# Patient Record
Sex: Female | Born: 1970 | Race: White | Hispanic: No | Marital: Single | Smoking: Current every day smoker
Health system: Southern US, Community
[De-identification: ages and names within clinical notes are randomized; demographics above are authoritative.]

## PROBLEM LIST (undated history)

## (undated) DIAGNOSIS — D509 Iron deficiency anemia, unspecified: Secondary | ICD-10-CM

## (undated) DIAGNOSIS — R200 Anesthesia of skin: Secondary | ICD-10-CM

## (undated) DIAGNOSIS — E7800 Pure hypercholesterolemia, unspecified: Secondary | ICD-10-CM

## (undated) DIAGNOSIS — F32A Depression, unspecified: Secondary | ICD-10-CM

## (undated) DIAGNOSIS — Z915 Personal history of self-harm: Secondary | ICD-10-CM

## (undated) DIAGNOSIS — F329 Major depressive disorder, single episode, unspecified: Secondary | ICD-10-CM

## (undated) DIAGNOSIS — M1990 Unspecified osteoarthritis, unspecified site: Secondary | ICD-10-CM

## (undated) DIAGNOSIS — N179 Acute kidney failure, unspecified: Secondary | ICD-10-CM

## (undated) DIAGNOSIS — R202 Paresthesia of skin: Secondary | ICD-10-CM

## (undated) DIAGNOSIS — K219 Gastro-esophageal reflux disease without esophagitis: Secondary | ICD-10-CM

## (undated) DIAGNOSIS — F1910 Other psychoactive substance abuse, uncomplicated: Secondary | ICD-10-CM

## (undated) DIAGNOSIS — J45909 Unspecified asthma, uncomplicated: Secondary | ICD-10-CM

## (undated) DIAGNOSIS — Z9151 Personal history of suicidal behavior: Secondary | ICD-10-CM

## (undated) DIAGNOSIS — F419 Anxiety disorder, unspecified: Secondary | ICD-10-CM

## (undated) HISTORY — PX: MOUTH SURGERY: SHX715

## (undated) HISTORY — PX: TONSILLECTOMY: SUR1361

## (undated) HISTORY — DX: Anesthesia of skin: R20.0

## (undated) HISTORY — PX: REFRACTIVE SURGERY: SHX103

## (undated) HISTORY — DX: Paresthesia of skin: R20.2

---

## 1997-04-24 HISTORY — PX: OTHER SURGICAL HISTORY: SHX169

## 1997-09-25 ENCOUNTER — Emergency Department (HOSPITAL_COMMUNITY): Admission: EM | Admit: 1997-09-25 | Discharge: 1997-09-25 | Payer: Self-pay | Admitting: Emergency Medicine

## 1997-09-28 ENCOUNTER — Encounter: Admission: RE | Admit: 1997-09-28 | Discharge: 1997-12-27 | Payer: Self-pay | Admitting: Internal Medicine

## 1997-11-20 ENCOUNTER — Ambulatory Visit (HOSPITAL_BASED_OUTPATIENT_CLINIC_OR_DEPARTMENT_OTHER): Admission: RE | Admit: 1997-11-20 | Discharge: 1997-11-20 | Payer: Self-pay | Admitting: *Deleted

## 1998-09-23 ENCOUNTER — Ambulatory Visit: Admission: RE | Admit: 1998-09-23 | Discharge: 1998-09-23 | Payer: Self-pay | Admitting: Occupational Medicine

## 1998-09-23 ENCOUNTER — Encounter: Payer: Self-pay | Admitting: Occupational Medicine

## 2000-03-15 ENCOUNTER — Emergency Department (HOSPITAL_COMMUNITY): Admission: EM | Admit: 2000-03-15 | Discharge: 2000-03-16 | Payer: Self-pay | Admitting: Emergency Medicine

## 2000-08-14 ENCOUNTER — Emergency Department (HOSPITAL_COMMUNITY): Admission: EM | Admit: 2000-08-14 | Discharge: 2000-08-14 | Payer: Self-pay

## 2000-08-19 ENCOUNTER — Encounter: Payer: Self-pay | Admitting: Otolaryngology

## 2000-08-19 ENCOUNTER — Ambulatory Visit (HOSPITAL_COMMUNITY): Admission: RE | Admit: 2000-08-19 | Discharge: 2000-08-19 | Payer: Self-pay | Admitting: Otolaryngology

## 2000-09-25 ENCOUNTER — Other Ambulatory Visit: Admission: RE | Admit: 2000-09-25 | Discharge: 2000-09-25 | Payer: Self-pay | Admitting: Obstetrics & Gynecology

## 2000-09-26 ENCOUNTER — Emergency Department (HOSPITAL_COMMUNITY): Admission: EM | Admit: 2000-09-26 | Discharge: 2000-09-26 | Payer: Self-pay | Admitting: *Deleted

## 2001-04-17 ENCOUNTER — Encounter: Payer: Self-pay | Admitting: Emergency Medicine

## 2001-04-17 ENCOUNTER — Emergency Department (HOSPITAL_COMMUNITY): Admission: EM | Admit: 2001-04-17 | Discharge: 2001-04-17 | Payer: Self-pay | Admitting: Emergency Medicine

## 2001-04-24 ENCOUNTER — Encounter: Payer: Self-pay | Admitting: Emergency Medicine

## 2001-04-24 ENCOUNTER — Observation Stay (HOSPITAL_COMMUNITY): Admission: EM | Admit: 2001-04-24 | Discharge: 2001-04-25 | Payer: Self-pay | Admitting: Emergency Medicine

## 2001-05-02 ENCOUNTER — Encounter (INDEPENDENT_AMBULATORY_CARE_PROVIDER_SITE_OTHER): Payer: Self-pay | Admitting: Specialist

## 2001-05-02 ENCOUNTER — Observation Stay (HOSPITAL_COMMUNITY): Admission: RE | Admit: 2001-05-02 | Discharge: 2001-05-03 | Payer: Self-pay | Admitting: Surgery

## 2001-12-19 ENCOUNTER — Encounter: Payer: Self-pay | Admitting: Emergency Medicine

## 2001-12-19 ENCOUNTER — Ambulatory Visit (HOSPITAL_COMMUNITY): Admission: RE | Admit: 2001-12-19 | Discharge: 2001-12-19 | Payer: Self-pay | Admitting: Emergency Medicine

## 2002-04-22 ENCOUNTER — Emergency Department (HOSPITAL_COMMUNITY): Admission: EM | Admit: 2002-04-22 | Discharge: 2002-04-22 | Payer: Self-pay | Admitting: *Deleted

## 2002-04-24 HISTORY — PX: APPENDECTOMY: SHX54

## 2002-05-09 ENCOUNTER — Other Ambulatory Visit: Admission: RE | Admit: 2002-05-09 | Discharge: 2002-05-09 | Payer: Self-pay | Admitting: Internal Medicine

## 2003-08-04 ENCOUNTER — Other Ambulatory Visit: Admission: RE | Admit: 2003-08-04 | Discharge: 2003-08-04 | Payer: Self-pay | Admitting: Internal Medicine

## 2006-03-14 ENCOUNTER — Ambulatory Visit: Payer: Self-pay | Admitting: Family Medicine

## 2006-03-14 ENCOUNTER — Inpatient Hospital Stay (HOSPITAL_COMMUNITY): Admission: EM | Admit: 2006-03-14 | Discharge: 2006-03-20 | Payer: Self-pay | Admitting: Emergency Medicine

## 2006-03-20 ENCOUNTER — Ambulatory Visit: Payer: Self-pay | Admitting: Psychiatry

## 2006-03-20 ENCOUNTER — Inpatient Hospital Stay (HOSPITAL_COMMUNITY): Admission: RE | Admit: 2006-03-20 | Discharge: 2006-03-23 | Payer: Self-pay | Admitting: Psychiatry

## 2007-01-22 ENCOUNTER — Other Ambulatory Visit: Admission: RE | Admit: 2007-01-22 | Discharge: 2007-01-22 | Payer: Self-pay | Admitting: Internal Medicine

## 2007-06-29 ENCOUNTER — Encounter: Admission: RE | Admit: 2007-06-29 | Discharge: 2007-06-29 | Payer: Self-pay | Admitting: Orthopedic Surgery

## 2007-07-13 ENCOUNTER — Emergency Department (HOSPITAL_COMMUNITY): Admission: EM | Admit: 2007-07-13 | Discharge: 2007-07-13 | Payer: Self-pay | Admitting: Emergency Medicine

## 2007-11-07 ENCOUNTER — Emergency Department (HOSPITAL_COMMUNITY): Admission: EM | Admit: 2007-11-07 | Discharge: 2007-11-07 | Payer: Self-pay | Admitting: Emergency Medicine

## 2008-02-10 ENCOUNTER — Emergency Department (HOSPITAL_COMMUNITY): Admission: EM | Admit: 2008-02-10 | Discharge: 2008-02-10 | Payer: Self-pay | Admitting: Emergency Medicine

## 2008-03-24 ENCOUNTER — Ambulatory Visit: Payer: Self-pay | Admitting: Family Medicine

## 2008-03-24 ENCOUNTER — Telehealth: Payer: Self-pay | Admitting: Family Medicine

## 2008-03-24 ENCOUNTER — Encounter: Payer: Self-pay | Admitting: Family Medicine

## 2008-03-24 ENCOUNTER — Other Ambulatory Visit: Admission: RE | Admit: 2008-03-24 | Discharge: 2008-03-24 | Payer: Self-pay | Admitting: Family Medicine

## 2008-03-24 DIAGNOSIS — R519 Headache, unspecified: Secondary | ICD-10-CM | POA: Insufficient documentation

## 2008-03-24 DIAGNOSIS — F411 Generalized anxiety disorder: Secondary | ICD-10-CM | POA: Insufficient documentation

## 2008-03-24 DIAGNOSIS — M25519 Pain in unspecified shoulder: Secondary | ICD-10-CM | POA: Insufficient documentation

## 2008-03-24 DIAGNOSIS — R51 Headache: Secondary | ICD-10-CM | POA: Insufficient documentation

## 2008-03-24 DIAGNOSIS — F418 Other specified anxiety disorders: Secondary | ICD-10-CM | POA: Insufficient documentation

## 2008-04-21 ENCOUNTER — Ambulatory Visit: Payer: Self-pay | Admitting: Family Medicine

## 2008-05-28 ENCOUNTER — Ambulatory Visit: Payer: Self-pay | Admitting: Family Medicine

## 2008-05-28 DIAGNOSIS — M549 Dorsalgia, unspecified: Secondary | ICD-10-CM | POA: Insufficient documentation

## 2008-05-28 DIAGNOSIS — R1012 Left upper quadrant pain: Secondary | ICD-10-CM | POA: Insufficient documentation

## 2008-06-04 ENCOUNTER — Ambulatory Visit: Payer: Self-pay | Admitting: Family Medicine

## 2008-06-04 ENCOUNTER — Emergency Department (HOSPITAL_COMMUNITY): Admission: EM | Admit: 2008-06-04 | Discharge: 2008-06-04 | Payer: Self-pay | Admitting: Emergency Medicine

## 2008-06-12 ENCOUNTER — Telehealth: Payer: Self-pay | Admitting: Family Medicine

## 2008-09-30 ENCOUNTER — Telehealth (INDEPENDENT_AMBULATORY_CARE_PROVIDER_SITE_OTHER): Payer: Self-pay | Admitting: *Deleted

## 2008-09-30 ENCOUNTER — Ambulatory Visit: Payer: Self-pay | Admitting: Family Medicine

## 2008-09-30 DIAGNOSIS — J45909 Unspecified asthma, uncomplicated: Secondary | ICD-10-CM | POA: Insufficient documentation

## 2008-10-02 ENCOUNTER — Ambulatory Visit: Payer: Self-pay | Admitting: Family Medicine

## 2008-11-09 ENCOUNTER — Inpatient Hospital Stay (HOSPITAL_COMMUNITY): Admission: AD | Admit: 2008-11-09 | Discharge: 2008-11-13 | Payer: Self-pay | Admitting: Obstetrics and Gynecology

## 2008-11-09 ENCOUNTER — Ambulatory Visit: Payer: Self-pay | Admitting: Obstetrics & Gynecology

## 2008-11-09 ENCOUNTER — Encounter: Payer: Self-pay | Admitting: Obstetrics & Gynecology

## 2008-11-12 ENCOUNTER — Telehealth: Payer: Self-pay | Admitting: Family Medicine

## 2008-11-24 ENCOUNTER — Ambulatory Visit: Payer: Self-pay | Admitting: Family Medicine

## 2008-11-24 DIAGNOSIS — IMO0002 Reserved for concepts with insufficient information to code with codable children: Secondary | ICD-10-CM | POA: Insufficient documentation

## 2008-11-24 LAB — CONVERTED CEMR LAB
AST: 18 units/L (ref 0–37)
Albumin: 3.4 g/dL — ABNORMAL LOW (ref 3.5–5.2)
Alkaline Phosphatase: 120 units/L — ABNORMAL HIGH (ref 39–117)
Basophils Absolute: 0 10*3/uL (ref 0.0–0.1)
Calcium: 8.8 mg/dL (ref 8.4–10.5)
GFR calc non Af Amer: 74.52 mL/min (ref >60)
Glucose, Bld: 83 mg/dL (ref 70–99)
Hemoglobin: 10.1 g/dL — ABNORMAL LOW (ref 12.0–15.0)
Lymphocytes Relative: 28 % (ref 12.0–46.0)
Monocytes Relative: 2.2 % — ABNORMAL LOW (ref 3.0–12.0)
Neutro Abs: 5.8 10*3/uL (ref 1.4–7.7)
RDW: 14.1 % (ref 11.5–14.6)
Sodium: 141 meq/L (ref 135–145)
Total Bilirubin: 0.5 mg/dL (ref 0.3–1.2)

## 2008-11-25 ENCOUNTER — Telehealth: Payer: Self-pay | Admitting: Family Medicine

## 2008-11-26 ENCOUNTER — Ambulatory Visit: Payer: Self-pay | Admitting: Family Medicine

## 2008-11-30 ENCOUNTER — Ambulatory Visit: Payer: Self-pay | Admitting: Family Medicine

## 2008-12-02 ENCOUNTER — Telehealth: Payer: Self-pay | Admitting: Family Medicine

## 2008-12-07 ENCOUNTER — Telehealth: Payer: Self-pay | Admitting: Internal Medicine

## 2009-01-14 ENCOUNTER — Emergency Department (HOSPITAL_COMMUNITY): Admission: EM | Admit: 2009-01-14 | Discharge: 2009-01-14 | Payer: Self-pay | Admitting: Family Medicine

## 2009-01-25 ENCOUNTER — Telehealth: Payer: Self-pay | Admitting: Family Medicine

## 2009-01-27 ENCOUNTER — Ambulatory Visit: Payer: Self-pay | Admitting: Family Medicine

## 2009-04-16 ENCOUNTER — Ambulatory Visit: Payer: Self-pay | Admitting: Diagnostic Radiology

## 2009-04-16 ENCOUNTER — Emergency Department (HOSPITAL_BASED_OUTPATIENT_CLINIC_OR_DEPARTMENT_OTHER): Admission: EM | Admit: 2009-04-16 | Discharge: 2009-04-16 | Payer: Self-pay | Admitting: Emergency Medicine

## 2009-05-07 ENCOUNTER — Ambulatory Visit: Payer: Self-pay | Admitting: Family Medicine

## 2009-05-07 ENCOUNTER — Telehealth: Payer: Self-pay | Admitting: Family Medicine

## 2009-05-16 ENCOUNTER — Emergency Department (HOSPITAL_COMMUNITY): Admission: EM | Admit: 2009-05-16 | Discharge: 2009-05-16 | Payer: Self-pay | Admitting: Emergency Medicine

## 2009-05-18 ENCOUNTER — Telehealth: Payer: Self-pay | Admitting: Family Medicine

## 2009-05-18 DIAGNOSIS — Z9189 Other specified personal risk factors, not elsewhere classified: Secondary | ICD-10-CM | POA: Insufficient documentation

## 2009-05-19 ENCOUNTER — Ambulatory Visit: Payer: Self-pay | Admitting: Gastroenterology

## 2009-05-19 ENCOUNTER — Telehealth: Payer: Self-pay | Admitting: Family Medicine

## 2009-05-19 ENCOUNTER — Telehealth: Payer: Self-pay | Admitting: Internal Medicine

## 2009-05-19 DIAGNOSIS — R110 Nausea: Secondary | ICD-10-CM | POA: Insufficient documentation

## 2009-05-19 DIAGNOSIS — R1031 Right lower quadrant pain: Secondary | ICD-10-CM | POA: Insufficient documentation

## 2009-05-20 ENCOUNTER — Telehealth: Payer: Self-pay | Admitting: Physician Assistant

## 2009-05-20 ENCOUNTER — Ambulatory Visit (HOSPITAL_COMMUNITY): Admission: RE | Admit: 2009-05-20 | Discharge: 2009-05-20 | Payer: Self-pay | Admitting: Gastroenterology

## 2009-05-20 ENCOUNTER — Ambulatory Visit: Payer: Self-pay | Admitting: Family Medicine

## 2009-05-20 ENCOUNTER — Other Ambulatory Visit: Admission: RE | Admit: 2009-05-20 | Discharge: 2009-05-20 | Payer: Self-pay | Admitting: Family Medicine

## 2009-05-20 DIAGNOSIS — Z720 Tobacco use: Secondary | ICD-10-CM | POA: Insufficient documentation

## 2009-05-21 ENCOUNTER — Telehealth: Payer: Self-pay | Admitting: Physician Assistant

## 2009-05-21 LAB — CONVERTED CEMR LAB
Albumin: 4.2 g/dL (ref 3.5–5.2)
Basophils Absolute: 0.1 10*3/uL (ref 0.0–0.1)
CRP, High Sensitivity: 1.9 (ref 0.00–5.00)
Eosinophils Absolute: 0.2 10*3/uL (ref 0.0–0.7)
HCT: 41.4 % (ref 36.0–46.0)
Hemoglobin: 13.7 g/dL (ref 12.0–15.0)
Lymphs Abs: 3.6 10*3/uL (ref 0.7–4.0)
MCHC: 33.1 g/dL (ref 30.0–36.0)
Monocytes Absolute: 0.9 10*3/uL (ref 0.1–1.0)
Neutro Abs: 6 10*3/uL (ref 1.4–7.7)
RDW: 12.5 % (ref 11.5–14.6)

## 2009-05-27 ENCOUNTER — Telehealth: Payer: Self-pay | Admitting: Family Medicine

## 2009-05-28 ENCOUNTER — Telehealth: Payer: Self-pay | Admitting: Family Medicine

## 2009-06-10 ENCOUNTER — Ambulatory Visit: Payer: Self-pay | Admitting: Family Medicine

## 2009-06-10 DIAGNOSIS — M542 Cervicalgia: Secondary | ICD-10-CM | POA: Insufficient documentation

## 2009-06-14 ENCOUNTER — Telehealth: Payer: Self-pay | Admitting: Family Medicine

## 2009-06-24 ENCOUNTER — Telehealth: Payer: Self-pay | Admitting: Family Medicine

## 2009-06-25 ENCOUNTER — Telehealth: Payer: Self-pay | Admitting: *Deleted

## 2009-06-28 ENCOUNTER — Telehealth: Payer: Self-pay | Admitting: Family Medicine

## 2010-04-23 ENCOUNTER — Emergency Department (HOSPITAL_COMMUNITY)
Admission: EM | Admit: 2010-04-23 | Discharge: 2010-04-23 | Payer: Self-pay | Source: Home / Self Care | Admitting: Emergency Medicine

## 2010-05-09 ENCOUNTER — Emergency Department (HOSPITAL_COMMUNITY)
Admission: EM | Admit: 2010-05-09 | Discharge: 2010-05-10 | Payer: Self-pay | Source: Home / Self Care | Admitting: Emergency Medicine

## 2010-05-24 NOTE — Assessment & Plan Note (Signed)
Summary: Abd. pain/seen in ER on1/23/2011   History of Present Illness Visit Type: Initial Visit Primary GI MD: Melvia Heaps MD [facility] Primary Provider: JEFFRE TODD, MD Chief Complaint: abdominal pain History of Present Illness:   40 YO FEMALE NEW TO GI TODAY,REFERRED BY DR TODD FOR EVALUATION OF LOWER ABDOMINAL PAIN. PT RELATES ONE WEEK HX OF INTERMITTENT SHARP RIGHT SIDED LOWER ABDOMINAL PAIN RADIATING UP INTO HER RIGHT MID ABDOMEN,AND INTO HER BACK. SHE DESCRIBES THE PAIN AS LIKE A BAD MENSTRUAL CRAMP. SHE REPORTS LOW GRADE FEVER IN THE 99 RANGE,NO DIARRHEA, MELENA. +NAUSEA, NO VOMITING,APPETITE POOR,BUT NO DEFINITE INCREASED PAIN POST PRANDIALLY. DENIES ASA/NSAID USE. SHE HAS HAD AN APPENDECTOMY,HAD A C-SECTION 6 MONTHS AGO. NO URINARY SXS. SHE DOES HAVE TEENAGE STEP-CHILDREN WHO SHE LIVES WITH WHO WERE ILL LAST WEEK WITH A GI ILLNESS. SHE WENT TO THE ER WITH THIS SAME PAIN ON 05/16/09;HAD A PELVIC US WHICH WAS NEGATIVE ;LABS UNREMARKABLE EXCEPT WBC12.0,NO SHIFT.   GI Review of Systems    Reports abdominal pain, acid reflux, belching, bloating, loss of appetite, nausea, and  weight loss.     Location of  Abdominal pain: lower abdomen. Weight loss of 6 pounds over 1 week.   Denies chest pain, dysphagia with liquids, dysphagia with solids, heartburn, vomiting blood, and  weight gain.        Denies anal fissure, black tarry stools, change in bowel habit, constipation, diarrhea, diverticulosis, fecal incontinence, heme positive stool, hemorrhoids, irritable bowel syndrome, jaundice, light color stool, liver problems, rectal bleeding, and  rectal pain.    Current Medications (verified): 1)  Multi-Day  Tabs (Multiple Vitamin) .... Once Daily 2)  Klonopin 1 Mg Tabs (Clonazepam) .Marland Kitchen.. 1 Tab @ Bedtime 3)  Promethazine Hcl 25 Mg Supp (Promethazine Hcl) .Marland Kitchen.. 1 Q 8 Hr. As Needed 4)  Cymbalta 30 Mg Cpep (Duloxetine Hcl) .... Once Daily 5)  Magnesium 500 Mg Tabs (Magnesium) .... Once Daily 6)  Vicodin  5-500 Mg Tabs (Hydrocodone-Acetaminophen) .... As Needed 7)  Ibuprofen 800 Mg Tabs (Ibuprofen) .... As Needed For Pain  Allergies (verified): 1)  ! Sulfa  Past History:  Past Medical History: Headache narcotic addiction Anxiety Depression Anemia Arthritis Chronic Headaches GERD Hypertension Obesity Pneumonia Urinary Tract Infection  Past Surgical History: Appendectomy Tonsillectomy C-section Tympanoplasty  Family History: Reviewed history from 03/24/2008 and no changes required. Family History of Alcoholism/Addiction Family History of Anxiety Family History Depression Family History High cholesterol Family History Hypertension  Social History: Single Current Smoker Alcohol use-no Drug use-no Regular exercise-no Occupation:RN  Review of Systems       The patient complains of allergy/sinus, anxiety-new, arthritis/joint pain, back pain, depression-new, fatigue, fever, headaches-new, hearing problems, heart murmur, night sweats, sleeping problems, thirst - excessive, and urine leakage.         OTHERWISE NEGATIVE EXCEPT AS IN HPI  Vital Signs:  Patient profile:   40 year old female Height:      62.25 inches Weight:      185.13 pounds BMI:     33.71 Temp:     99 degrees F oral Pulse rate:   100 / minute Pulse rhythm:   regular BP sitting:   100 / 60  (left arm) Cuff size:   regular  Vitals Entered By: June McMurray CMA Duncan Dull) (May 19, 2009 1:47 PM)  Physical Exam  General:  Well developed, well nourished, no acute distress. Head:  Normocephalic and atraumatic. Eyes:  PERRLA, no icterus. Lungs:  Clear throughout to auscultation. Heart:  Regular rate and rhythm; no murmurs, rubs,  or bruits. Abdomen:  SOFT,TENDER RLQ,NO GUARDING,NO REBOUND,NO PALP MASS OR HSM,BS+ Rectal:  NOT DONE Extremities:  No clubbing, cyanosis, edema or deformities noted. Neurologic:  Alert and  oriented x4;  grossly normal neurologically. Psych:  Alert and cooperative.  Normal mood and affect.   Impression & Recommendations:  Problem # 1:  ABDOMINAL PAIN RIGHT LOWER QUADRANT (ICD-789.03) Assessment New 40 YO FEMALE WITH ONE WEEK HX OF INTENSE RIGHT LOWER ABDOMINAL PAIN,NAUSEA. ETIOLOGY UNCLEAR;PT S/P REMOTE APPENDECTOMY,S/P C-SECTION 6 MONTHS AGO.   SCHEDULE FOR CT ABDOMEN/PELVIS  STOP MOTRIN WHICH IS NOT HELPING PAIN REFILL VICODEN 5/500 ONE by mouth Q 6 HOURS AS NEEDED FOR  PAIN #30 TRIAL OF BENTYL 10 MG 3 TIMES DAILY AS NEEDED FOR PAIN/CRAMPING REPEAT CBC,CHECK HEPATIC PANEL FOLLOW UP IN OFFICE IN 2-3 WEEKS DR KAPLAN;MAY NEED FURTHER WORK-UP IN THE INTERIM PENDING CT FINDINGS. Orders: CT Abdomen/Pelvis with Contrast (CT Abd/Pelvis w/con) TLB-CBC Platelet - w/Differential (85025-CBCD) TLB-Hepatic/Liver Function Pnl (80076-HEPATIC) TLB-CRP-High Sensitivity (C-Reactive Protein) (86140-FCRP)  Problem # 2:  TOBACCO ABUSE (ICD-305.1) Assessment: Comment Only  Problem # 3:  ANXIETY (ICD-300.00) Assessment: Comment Only  Patient Instructions: 1)  Please go to the lab, basement level. 2)  We scheduled the CT at [facility], 1st floor. 3)  Date: 05-20-09 at 3:30 PM.  Arrive at 3:15PM.  4)  We sent contrast home with you.  Put it in your refrigerator when you get home.  Do not pour over ice to drink it. Directions provided. 5)  We have a pREscription for you to take to your pharmacy for pain, Vicodin.  6)  We sent a pREscription electronically to your pharmacy for Bentyl. 7)  We made you a follow up appointment with Dr. Arlyce Dice on 06-07-09 at 10AM. 8)  The medication list was reviewed and reconciled.  All changed / newly prescribed medications were explained.  A complete medication list was provided to the patient / caregiver. Prescriptions: BENTYL 10 MG CAPS (DICYCLOMINE HCL) Take 1 tab 3 times a day before meals for cramping and pain.  #90 x 0   Entered by:   Lowry Ram NCMA   Authorized by:   Sammuel Cooper PA-c   Signed by:    Lowry Ram NCMA on 05/19/2009   Method used:   Electronically to        [facility] [address] (retail)       [phone]       [place], [place]  [address]       Ph: [phone]       Fax: [phone]   RxID:   [id number] VICODIN 5-500 MG TABS (HYDROCODONE-ACETAMINOPHEN) Take 1 tab every 6 hours as needed for pain.  #30 x 0   Entered by:   Lowry Ram NCMA   Authorized by:   Sammuel Cooper PA-c   Signed by:   Lowry Ram NCMA on 05/19/2009   Method used:   Printed then faxed to ...       [facility] [address] (retail)       [phone]       [place], [place]  [address]       Ph: [phone]       Fax: [phone]   RxID:   [id number]

## 2010-05-24 NOTE — Progress Notes (Signed)
Summary: klonopin refill  Phone Note From Pharmacy   Summary of Call: patient would like a refill of klonopin is this okay to fill? Initial call taken by: Kern Reap CMA Duncan Dull),  May 27, 2009 1:00 PM  Follow-up for Phone Call        okay her k lonopin, until she sees a psychiatrist, 0 .5 mg, dispense 30 tablets, one nightly no refills.  All further refills are to be done by the psychiatrist in the future Follow-up by: Roderick Pee MD,  May 28, 2009 9:19 AM    New/Updated Medications: KLONOPIN 0.5 MG TABS (CLONAZEPAM) take one tab at bedtime Prescriptions: KLONOPIN 0.5 MG TABS (CLONAZEPAM) take one tab at bedtime  #30 x 0   Entered by:   Kern Reap CMA (AAMA)   Authorized by:   Roderick Pee MD   Signed by:   Kern Reap CMA (AAMA) on 05/28/2009   Method used:   Telephoned to ...       [facility] [address] (retail)       [phone]       [place], [place]  [address]       Ph: [phone]       Fax: [phone]   RxID:   [id number]

## 2010-05-24 NOTE — Progress Notes (Signed)
Summary: Pt went to ER for abdominal pain. Needs to discuss w/ nurse  Phone Note Call from Patient Call back at [phone] cell   Caller: Patient Summary of Call: Pt had to go to ER on Sunday for abdominal pain. Pt is sch for pap this Friday 05/21/09 with Dr. Tawanna Cooler. Pt needs to speak with Fleet Contras before she comes in for the appt.  Initial call taken by: Lucy Antigua,  May 18, 2009 10:56 AM  Follow-up for Phone Call        please call Follow-up by: Roderick Pee MD,  May 18, 2009 1:44 PM  Additional Follow-up for Phone Call Additional follow up Details #1::        patient is still in pain.  I offered a earlier appointment. vicodin motrin with little relief.  any other suggestions? pt does not have any insurance.  would you still like for her to go to GI? Additional Follow-up by: Kern Reap CMA Duncan Dull),  May 18, 2009 3:36 PM  New Problems: ABDOMINAL PAIN, HX OF (ICD-V15.89)   Additional Follow-up for Phone Call Additional follow up Details #2::    Appt scheduled for today.  Pt aware. Follow-up by: Corky Mull,  May 19, 2009 10:45 AM  New Problems: ABDOMINAL PAIN, HX OF (ICD-V15.89)

## 2010-05-24 NOTE — Progress Notes (Signed)
Summary: would like to switch meds  Phone Note Call from Patient Call back at Home Phone [phone]   Caller: Patient---live call Summary of Call: Her Cymbalta is to  expensive. Would like to switch Prozac. Her pharmacy is [facility] [address]. Initial call taken by: Warnell Forester,  June 14, 2009 9:35 AM  Follow-up for Phone Call        ok............Marland KitchenProzac 40 mg, dispense 100 tablets directions one nightly, refills x 2 Follow-up by: Roderick Pee MD,  June 14, 2009 9:48 AM    New/Updated Medications: FLUOXETINE HCL 40 MG CAPS (FLUOXETINE HCL) take one tab two times a day Prescriptions: FLUOXETINE HCL 40 MG CAPS (FLUOXETINE HCL) take one tab two times a day  #180 x 2   Entered by:   Kern Reap CMA (AAMA)   Authorized by:   Roderick Pee MD   Signed by:   Kern Reap CMA (AAMA) on 06/14/2009   Method used:   Electronically to        [facility] [address] (retail)       [phone]       [place], [place]  [address]       Ph: [phone]       Fax: [phone]   RxID:   [id number]

## 2010-05-24 NOTE — Assessment & Plan Note (Signed)
Summary: CPX W/ PAP // RS   Vital Signs:  Patient profile:   40 year old female Height:      62.25 inches Weight:      181 pounds BP sitting:   124 / 80  (left arm) Cuff size:   regular  Vitals Entered By: Kern Reap CMA Duncan Dull) (May 20, 2009 12:26 PM)  History of Present Illness: Mary Ewing is a 40 year old single female, G1, P81, who comes in today for annual physical examination  Last Thursday she developed severe right and left lower quadrant abdominal pain.  After 3 days of severe pain.  She went to the emergency room.  She had numerous diagnostic studies done all of which were normal.  We then referred her to GI.  Amy saw her yesterday.  Exam was negative.  Labs were normal.  She is getting a CT scan of her abdomen today.  [name]  His right and left lower quadrant.  An 8 on a scale of one to 10.  Pain is a dull crampy-like pain.  No fever, chills, nausea, vomiting, diarrhea.  LMP last week for birth control.  She uses condoms.  Review of systems otherwise negative.  Family history noncontributory.  Hopefully, the CT scan, it will find something we can treat.  However, if her CT scan is negative, and the concern is obviously, the cause for her pain and we always need to be cognizant of the fact that she has a history of narcotic abuse  she still continues to smoke a half a pack of cigarettes a day.......... declined a smoking cessation program  Preventive Screening-Counseling & Management  Alcohol-Tobacco     Smoking Cessation Counseling: yes  Allergies: 1)  ! Sulfa  Past History:  Past medical, surgical, family and social histories (including risk factors) reviewed, and no changes noted (except as noted below).  Past Medical History: Reviewed history from 03/24/2008 and no changes required. Headache narcotic addiction Anxiety Depression  Past Surgical History: Appendectomy Caesarean section  Family History: Reviewed history from  03/24/2008 and no changes required. Family History of Alcoholism/Addiction Family History of Anxiety Family History Depression Family History High cholesterol Family History Hypertension  Social History: Reviewed history from 03/24/2008 and no changes required. Single Current Smoker Alcohol use-no Drug use-no Regular exercise-no  Review of Systems      See HPI  Physical Exam  General:  Well-developed,well-nourished,in no acute distress; alert,appropriate and cooperative throughout examination Head:  Normocephalic and atraumatic without obvious abnormalities. No apparent alopecia or balding. Eyes:  No corneal or conjunctival inflammation noted. EOMI. Perrla. Funduscopic exam benign, without hemorrhages, exudates or papilledema. Vision grossly normal. Ears:  External ear exam shows no significant lesions or deformities.  Otoscopic examination reveals clear canals, tympanic membranes are intact bilaterally without bulging, retraction, inflammation or discharge. Hearing is grossly normal bilaterally. Nose:  External nasal examination shows no deformity or inflammation. Nasal mucosa are pink and moist without lesions or exudates. Mouth:  Oral mucosa and oropharynx without lesions or exudates.  Teeth in good repair. Neck:  No deformities, masses, or tenderness noted. Chest Wall:  No deformities, masses, or tenderness noted. Breasts:  No mass, nodules, thickening, tenderness, bulging, retraction, inflamation, nipple discharge or skin changes noted.   Lungs:  Normal respiratory effort, chest expands symmetrically. Lungs are clear to auscultation, no crackles or wheezes. Heart:  Normal rate and regular rhythm. S1 and S2 normal without gallop, murmur, click, rub or other extra sounds. Abdomen:  the abdomen is soft.  Bowel sounds are normal.  No organomegaly, tenderness, right and left lower quadrant without rebound Genitalia:  Pelvic Exam:        External: normal female genitalia without  lesions or masses        Vagina: normal without lesions or masses        Cervix: normal without lesions or masses        Adnexa: normal bimanual exam without masses or fullness        Uterus: normal by palpation        Pap smear: performed Msk:  No deformity or scoliosis noted of thoracic or lumbar spine.   Pulses:  R and L carotid,radial,femoral,dorsalis pedis and posterior tibial pulses are full and equal bilaterally Extremities:  No clubbing, cyanosis, edema, or deformity noted with normal full range of motion of all joints.   Neurologic:  No cranial nerve deficits noted. Station and gait are normal. Plantar reflexes are down-going bilaterally. DTRs are symmetrical throughout. Sensory, motor and coordinative functions appear intact. Skin:  Intact without suspicious lesions or rashes Cervical Nodes:  No lymphadenopathy noted Axillary Nodes:  No palpable lymphadenopathy Inguinal Nodes:  No significant adenopathy Psych:  Cognition and judgment appear intact. Alert and cooperative with normal attention span and concentration. No apparent delusions, illusions, hallucinations   Impression & Recommendations:  Problem # 1:  ABDOMINAL PAIN RIGHT LOWER QUADRANT (ICD-789.03) Assessment Unchanged  Orders: Prescription Created Electronically [phone]) Tobacco use cessation intermediate 3-10 minutes ([address])  Problem # 2:  DEPRESSION (ICD-311) Assessment: Improved  Her updated medication list for this problem includes:    Klonopin 1 Mg Tabs (Clonazepam) .Marland Kitchen... 1 tab @ bedtime    Cymbalta 30 Mg Cpep (Duloxetine hcl) ..... Once daily  Orders: Prescription Created Electronically [phone]) Tobacco use cessation intermediate 3-10 minutes ([address])  Problem # 3:  TOBACCO ABUSE (ICD-305.1) Assessment: Unchanged  Orders: Prescription Created Electronically [phone]) Tobacco use cessation intermediate 3-10 minutes ([address])  Complete Medication List: 1)  Multi-day Tabs (Multiple vitamin) .... Once  daily 2)  Klonopin 1 Mg Tabs (Clonazepam) .Marland Kitchen.. 1 tab @ bedtime 3)  Promethazine Hcl 25 Mg Supp (Promethazine hcl) .Marland Kitchen.. 1 q 8 hr. as needed 4)  Cymbalta 30 Mg Cpep (Duloxetine hcl) .... Once daily 5)  Magnesium 500 Mg Tabs (Magnesium) .... Once daily 6)  Vicodin 5-500 Mg Tabs (Hydrocodone-acetaminophen) .... As needed 7)  Vicodin 5-500 Mg Tabs (Hydrocodone-acetaminophen) .... Take 1 tab every 6 hours as needed for pain. 8)  Bentyl 10 Mg Caps (Dicyclomine hcl) .... Take 1 tab 3 times a day before meals for cramping and pain.  Patient Instructions: 1)  Please schedule a follow-up appointment in 1 year. 2)  Tobacco is very bad for your health and your loved ones! You Should stop smoking!. 3)  Stop Smoking Tips: Choose a Quit date. Cut down before the Quit date. decide what you will do as a substitute when you feel the urge to smoke(gum,toothpick,exercise).

## 2010-05-24 NOTE — Progress Notes (Signed)
Summary: Pt called re: Ultram. Wants a call back asap  Phone Note Call from Patient Call back at Home Phone [phone]   Caller: Patient Summary of Call: Pt called re: Ultram. Pt wants to discuss this further.  Initial call taken by: Lucy Antigua,  June 25, 2009 2:12 PM  Follow-up for Phone Call        I would not recommend she change her dose until she sees the specialist Follow-up by: Roderick Pee MD,  June 25, 2009 2:42 PM  Additional Follow-up for Phone Call Additional follow up Details #1::        no per dr todd Additional Follow-up by: Kern Reap CMA Duncan Dull),  June 25, 2009 3:23 PM

## 2010-05-24 NOTE — Progress Notes (Signed)
Summary: CT results  Phone Note Call from Patient Call back at Home Phone [phone]   Caller: Patient Call For: Mike Gip Reason for Call: Lab or Test Results Summary of Call: pt would like her CT results Initial call taken by: Vallarie Mare,  May 21, 2009 1:45 PM  Follow-up for Phone Call        Let pt know the Ct results were normal.  The pt wanted to know if Dorothie Wah would approve reflling the Vicodin perscription.  I told her Sonji Starkes is off this afternoon and I would not have access to talking to her until Monday. Follow-up by: Joselyn Glassman,  May 21, 2009 2:42 PM

## 2010-05-24 NOTE — Progress Notes (Signed)
Summary: Pt has appt at [facility] on 06/08/09,but req to speak to nurse  Phone Note Call from Patient Call back at Home Phone [phone]   Caller: Patient Summary of Call: Pt has appt to see psychiatrist at the [facility], but not until 06/08/09. Pt would like Dr Barbette Or nurse to call today.  Initial call taken by: Lucy Antigua,  May 28, 2009 9:10 AM  Follow-up for Phone Call        okay to call issues are depression and continued abdominal pain Follow-up by: Roderick Pee MD,  May 28, 2009 9:19 AM  Additional Follow-up for Phone Call Additional follow up Details #1::        Phone Call Completed Additional Follow-up by: Kern Reap CMA Duncan Dull),  May 28, 2009 11:29 AM
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
## 2010-05-24 NOTE — Progress Notes (Signed)
Summary: Triage  Phone Note Call from Patient Call back at Home Phone [phone]   Caller: Patient Call For: Mike Gip Reason for Call: Talk to Nurse Summary of Call: Pt. was in yesterday and prescribed Vicodin and she is still having alot of pain and wants to know if something else can be prescribed Initial call taken by: Karna Christmas,  May 20, 2009 3:04 PM  Follow-up for Phone Call        Per Amy [facility] PA-C , she does not feel comfortable fiving the pt any more pain meds.  Amy wants to see what the CT shows. I advised pt. Follow-up by: Joselyn Glassman,  May 20, 2009 4:07 PM

## 2010-05-24 NOTE — Progress Notes (Signed)
Summary: Triage  Phone Note From Other Clinic   Caller: Emelda Fear [phone] Call For: Dr. Marina Goodell Summary of Call: Pt was seen in ER Sunday for Abdominal pain and they would like for her to be seen today or tomorrow Initial call taken by: Karna Christmas,  May 19, 2009 9:13 AM  Follow-up for Phone Call        To be seen by Eloisa Northern per Dr.Perry Message left on pt's. cell([phone]) to be here at 1:30 p.m.  todayand Aurther Loft at Dr.Todd's office ntfd. and she will attemp to reach pt. also. Follow-up by: Teryl Lucy RN,  May 19, 2009 10:31 AM

## 2010-05-24 NOTE — Progress Notes (Signed)
Summary: pain  Phone Note Call from Patient   Summary of Call: patient states she is still in pain and taking motrin, she is out of ultram should she come in for an office visit or any other suggestions? Initial call taken by: Kern Reap CMA Duncan Dull),  June 28, 2009 4:56 PM  Follow-up for Phone Call        ultram 50 mg, number 50, directions one p.o., t.i.d., p.r.n. for severe pain, absolutely no refills Follow-up by: Roderick Pee MD,  June 28, 2009 5:14 PM    New/Updated Medications: ULTRAM 50 MG TABS (TRAMADOL HCL) take one tab three times a day as needed for pain Prescriptions: ULTRAM 50 MG TABS (TRAMADOL HCL) take one tab three times a day as needed for pain  #50 x 0   Entered by:   Kern Reap CMA (AAMA)   Authorized by:   Roderick Pee MD   Signed by:   Kern Reap CMA (AAMA) on 06/28/2009   Method used:   Electronically to        [facility] [address] (retail)       [phone]       [place], [place]  [address]       Ph: [phone]       Fax: [phone]   RxID:   [id number]

## 2010-05-24 NOTE — Progress Notes (Signed)
Summary: appt ?  Phone Note Call from Patient Call back at Home Phone [phone]   Caller: Patient-live call Reason for Call: Talk to Doctor Summary of Call: pt is to see dr todd tomorrow at 12:15 pm.  Her ct scan is at 3:15 tomorrow. Should she still keep her appt with Dr Tawanna Cooler? Initial call taken by: Warnell Forester,  May 19, 2009 3:56 PM  Follow-up for Phone Call        yes Follow-up by: Roderick Pee MD,  May 20, 2009 8:26 AM  Additional Follow-up for Phone Call Additional follow up Details #1::        lmoam to keep appt for today. Additional Follow-up by: Warnell Forester,  May 20, 2009 8:53 AM

## 2010-05-24 NOTE — Progress Notes (Signed)
Summary: REQ FOR MED  Phone Note Call from Patient   Caller: Patient [phone] Reason for Call: Talk to Nurse Summary of Call: Pt called to see if Dr Tawanna Cooler can call her in Rx for Ultram with increased dosage not a refill for same dosage.... Pt req that dosage be increase because present dosage doesn't relieve neck / back pain.Marland KitchenMarland KitchenMarland KitchenPt has appt w/ neurologist on July 16, 2009, can't have meds filled through neuro till after appt..... Pt adv Rx be sent into [facility].  Initial call taken by: Debbra Riding,  June 24, 2009 11:11 AM  Follow-up for Phone Call        no Follow-up by: Roderick Pee MD,  June 24, 2009 11:37 AM  Additional Follow-up for Phone Call Additional follow up Details #1::        Phone Call Completed----Contacted pt and adv same.  Additional Follow-up by: Debbra Riding,  June 24, 2009 11:59 AM

## 2010-05-24 NOTE — Progress Notes (Signed)
Summary: need new rx  Phone Note Call from Patient Call back at [phone]   Caller: Patient Summary of Call: Was just seen by Dr Tawanna Cooler. She forgot to get her rx for phenergan. Please send new rx to [facility]. Initial call taken by: Warnell Forester,  May 07, 2009 4:21 PM  Follow-up for Phone Call        Phenergan suppositories 25 mg, dispense 6 directions, one every 8 hours p.r.n. for nausea, vomiting, no refills Follow-up by: Roderick Pee MD,  May 07, 2009 4:47 PM

## 2010-05-24 NOTE — Assessment & Plan Note (Signed)
Summary: ANXIETY ISSUES / CONCERNS // RS   Vital Signs:  Patient profile:   40 year old female Weight:      181 pounds Temp:     99.3 degrees F BP sitting:   132 / 76  Vitals Entered By: Lynann Beaver CMA (May 07, 2009 3:42 PM) CC: insomnia Is Patient Diabetic? No   CC:  insomnia.  History of Present Illness: Mary Ewing is a 39 year old single female, G63, P70, 55-month-old baby who lives with her boyfriend and her boyfriend's two children by his previous marriage who are 75 and 33, who comes in today for evaluation of sleep dysfunction.  She is being seen at the [facility]  She has a psychiatric Child psychotherapist, Rene Kocher at [facility] [phone].  There trying to get her into see a psychiatrist to get her medication.  However, she cannot be seen for two months.  For the past 10 days to two weeks.  She said worsening sleep dysfunction and anxiety.  She is unemployed, has no insurance.  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  Last Pap December 2009, mammogram, 2004 because of a cyst in her right breast, which turned out to be benign.  Birth control using condoms.  LMP now  Current Medications (verified): 1)  Multi-Day  Tabs (Multiple Vitamin) .... Once Daily  Allergies (verified): 1)  ! Sulfa  Past History:  Past medical, surgical, family and social histories (including risk factors) reviewed for relevance to current acute and chronic problems.  Past Medical History: Reviewed history from 03/24/2008 and no changes required. Headache narcotic addiction Anxiety Depression  Past Surgical History: Reviewed history from 03/24/2008 and no changes required. Appendectomy  Family History: Reviewed history from 03/24/2008 and no changes required. Family History of Alcoholism/Addiction Family History of Anxiety Family History Depression Family History High cholesterol Family History Hypertension  Social History: Reviewed history  from 03/24/2008 and no changes required. Single Current Smoker Alcohol use-no Drug use-no Regular exercise-no  Review of Systems      See HPI  Physical Exam  General:  Well-developed,well-nourished,in no acute distress; alert,appropriate and cooperative throughout examination Psych:  Cognition and judgment appear intact. Alert and cooperative with normal attention span and concentration. No apparent delusions, illusions, hallucinations   Impression & Recommendations:  Problem # 1:  DEPRESSION (ICD-311)  The following medications were removed from the medication list:    Fluoxetine Hcl 20 Mg Caps (Fluoxetine hcl) .Marland Kitchen... Take one tab once daily Her updated medication list for this problem includes:    Klonopin 1 Mg Tabs (Clonazepam) .Marland Kitchen... 1 tab @ bedtime  Problem # 2:  ANXIETY (ICD-300.00) Assessment: Deteriorated  The following medications were removed from the medication list:    Fluoxetine Hcl 20 Mg Caps (Fluoxetine hcl) .Marland Kitchen... Take one tab once daily Her updated medication list for this problem includes:    Klonopin 1 Mg Tabs (Clonazepam) .Marland Kitchen... 1 tab @ bedtime  Complete Medication List: 1)  Multi-day Tabs (Multiple vitamin) .... Once daily 2)  Klonopin 1 Mg Tabs (Clonazepam) .Marland Kitchen.. 1 tab @ bedtime  Patient Instructions: 1)  restart the Cymbalta, one daily, and Klonopin .5 nightly return in two weeks for follow-up schedule.  A30 minute appointment Prescriptions: KLONOPIN 1 MG TABS (CLONAZEPAM) 1 tab @ bedtime  #30 x 0   Entered and Authorized by:   Roderick Pee MD   Signed by:   Roderick Pee MD on 05/07/2009   Method used:  Print then Give to Patient   RxID:   [id number]

## 2010-05-24 NOTE — Progress Notes (Signed)
Summary: Pt req a script for Ultram to [facility]  Phone Note Call from Patient Call back at Home Phone [phone]   Caller: Patient [phone] Summary of Call: Pt called and is wondering if Dr. Tawanna Cooler can call in some Ultram to take during the day. Please call in to [facility] [address].  Initial call taken by: Lucy Antigua,  June 14, 2009 10:57 AM  Follow-up for Phone Call        ultram for what condition ????????????? Follow-up by: Roderick Pee MD,  June 14, 2009 5:22 PM  Additional Follow-up for Phone Call Additional follow up Details #1::        left message on machine for patient  Additional Follow-up by: Kern Reap CMA Duncan Dull),  June 15, 2009 12:26 PM    Additional Follow-up for Phone Call Additional follow up Details #2::    Pt adv that she can't see neurologist till March 25th, 2011...Marland KitchenMarland KitchenMarland Kitchen Pt adv that she can't take the Vicodin / Flexeril  that Dr Tawanna Cooler prescribed for her during the day because of side effects (drowsiness).... Pt would like to see if Dr Tawanna Cooler can prescribe Ultram because it will not make pt drowsy (pt adv she has had Ultram in the past).... Ibuprofen / Tylenol give little relief for neck / upper back pain.  Follow-up by: Debbra Riding,  June 15, 2009 12:35 PM  Additional Follow-up for Phone Call Additional follow up Details #3:: Details for Additional Follow-up Action Taken: ultram 50 mg b.i.d., p.r.n., dispense 60 tablets no refills.  Any further refills will need to be done by the neurologist Additional Follow-up by: Roderick Pee MD,  June 15, 2009 12:39 PM  New/Updated Medications: ULTRAM 50 MG TABS (TRAMADOL HCL) take one tab two times a day Prescriptions: ULTRAM 50 MG TABS (TRAMADOL HCL) take one tab two times a day  #60 x 0   Entered by:   Kern Reap CMA (AAMA)   Authorized by:   Roderick Pee MD   Signed by:   Kern Reap CMA (AAMA) on 06/15/2009   Method used:   Electronically to        [facility]  [address] (retail)       [phone]       [place], [place]  [address]       Ph: [phone]       Fax: [phone]   RxID:   [id number]

## 2010-07-04 LAB — URINE MICROSCOPIC-ADD ON

## 2010-07-04 LAB — URINALYSIS, ROUTINE W REFLEX MICROSCOPIC
Leukocytes, UA: NEGATIVE
Protein, ur: 30 mg/dL — AB
Urobilinogen, UA: 0.2 mg/dL (ref 0.0–1.0)

## 2010-07-04 LAB — TRICYCLICS SCREEN, URINE: TCA Scrn: NOT DETECTED

## 2010-07-10 LAB — POCT I-STAT, CHEM 8
BUN: 10 mg/dL (ref 6–23)
Chloride: 108 mEq/L (ref 96–112)
HCT: 43 % (ref 36.0–46.0)
Potassium: 3.7 mEq/L (ref 3.5–5.1)
Sodium: 138 mEq/L (ref 135–145)

## 2010-07-10 LAB — CBC
HCT: 41.7 % (ref 36.0–46.0)
Platelets: 411 10*3/uL — ABNORMAL HIGH (ref 150–400)
RDW: 13.4 % (ref 11.5–15.5)
WBC: 12 10*3/uL — ABNORMAL HIGH (ref 4.0–10.5)

## 2010-07-10 LAB — WET PREP, GENITAL
Clue Cells Wet Prep HPF POC: NONE SEEN
Trich, Wet Prep: NONE SEEN
Yeast Wet Prep HPF POC: NONE SEEN

## 2010-07-10 LAB — POCT PREGNANCY, URINE: Preg Test, Ur: NEGATIVE

## 2010-07-10 LAB — GC/CHLAMYDIA PROBE AMP, GENITAL: Chlamydia, DNA Probe: NEGATIVE

## 2010-07-10 LAB — URINE MICROSCOPIC-ADD ON

## 2010-07-10 LAB — PREGNANCY, URINE: Preg Test, Ur: NEGATIVE

## 2010-07-10 LAB — DIFFERENTIAL
Basophils Absolute: 0 10*3/uL (ref 0.0–0.1)
Lymphocytes Relative: 27 % (ref 12–46)
Lymphs Abs: 3.2 10*3/uL (ref 0.7–4.0)
Neutro Abs: 7.7 10*3/uL (ref 1.7–7.7)
Neutrophils Relative %: 64 % (ref 43–77)

## 2010-07-10 LAB — URINALYSIS, ROUTINE W REFLEX MICROSCOPIC
Hgb urine dipstick: NEGATIVE
Nitrite: NEGATIVE
pH: 6 (ref 5.0–8.0)

## 2010-07-25 ENCOUNTER — Emergency Department (HOSPITAL_COMMUNITY)
Admission: EM | Admit: 2010-07-25 | Discharge: 2010-07-26 | Disposition: A | Discharge status: Other Acute Inpatient Hospital | Payer: Self-pay | Attending: Emergency Medicine | Admitting: Emergency Medicine

## 2010-07-25 DIAGNOSIS — R404 Transient alteration of awareness: Secondary | ICD-10-CM | POA: Insufficient documentation

## 2010-07-25 DIAGNOSIS — F489 Nonpsychotic mental disorder, unspecified: Secondary | ICD-10-CM | POA: Insufficient documentation

## 2010-07-25 LAB — URINALYSIS, ROUTINE W REFLEX MICROSCOPIC
Hgb urine dipstick: NEGATIVE
Leukocytes, UA: NEGATIVE
Nitrite: NEGATIVE
Protein, ur: 30 mg/dL — AB
Urobilinogen, UA: 0.2 mg/dL (ref 0.0–1.0)

## 2010-07-25 LAB — SALICYLATE LEVEL: Salicylate Lvl: <4 mg/dL (ref 2.8–20.0)

## 2010-07-25 LAB — CBC
MCH: 30 pg (ref 26.0–34.0)
MCV: 91.9 fL (ref 78.0–100.0)
Platelets: 260 10*3/uL (ref 150–400)
RBC: 4.44 MIL/uL (ref 3.87–5.11)

## 2010-07-25 LAB — DIFFERENTIAL
Eosinophils Absolute: 0.2 10*3/uL (ref 0.0–0.7)
Lymphs Abs: 3.1 10*3/uL (ref 0.7–4.0)
Monocytes Relative: 11 % (ref 3–12)
Neutrophils Relative %: 55 % (ref 43–77)

## 2010-07-25 LAB — URINE MICROSCOPIC-ADD ON

## 2010-07-25 LAB — ACETAMINOPHEN LEVEL: Acetaminophen (Tylenol), Serum: <10 ug/mL — ABNORMAL LOW (ref 10–30)

## 2010-07-25 LAB — BASIC METABOLIC PANEL
GFR calc non Af Amer: >60 mL/min (ref >60)
Glucose, Bld: 86 mg/dL (ref 70–99)
Potassium: 3.7 mEq/L (ref 3.5–5.1)
Sodium: 143 mEq/L (ref 135–145)

## 2010-07-25 LAB — POCT CARDIAC MARKERS: Troponin i, poc: <0.05 ng/mL (ref 0.00–0.09)

## 2010-07-25 LAB — RAPID URINE DRUG SCREEN, HOSP PERFORMED
Amphetamines: NOT DETECTED
Benzodiazepines: POSITIVE — AB
Tetrahydrocannabinol: NOT DETECTED

## 2010-07-26 ENCOUNTER — Inpatient Hospital Stay (HOSPITAL_COMMUNITY)
Admission: AD | Admit: 2010-07-26 | Discharge: 2010-07-29 | DRG: 885 | Disposition: A | Discharge status: Home / Self Care | Payer: PRIVATE HEALTH INSURANCE | Source: Ambulatory Visit | Attending: Psychiatry | Admitting: Psychiatry

## 2010-07-26 DIAGNOSIS — F39 Unspecified mood [affective] disorder: Secondary | ICD-10-CM

## 2010-07-26 DIAGNOSIS — T424X4A Poisoning by benzodiazepines, undetermined, initial encounter: Secondary | ICD-10-CM

## 2010-07-26 DIAGNOSIS — T43294A Poisoning by other antidepressants, undetermined, initial encounter: Secondary | ICD-10-CM

## 2010-07-26 DIAGNOSIS — T438X2A Poisoning by other psychotropic drugs, intentional self-harm, initial encounter: Secondary | ICD-10-CM

## 2010-07-26 DIAGNOSIS — H6690 Otitis media, unspecified, unspecified ear: Secondary | ICD-10-CM

## 2010-07-26 DIAGNOSIS — M25519 Pain in unspecified shoulder: Secondary | ICD-10-CM

## 2010-07-26 DIAGNOSIS — Z560 Unemployment, unspecified: Secondary | ICD-10-CM

## 2010-07-26 DIAGNOSIS — T43502A Poisoning by unspecified antipsychotics and neuroleptics, intentional self-harm, initial encounter: Secondary | ICD-10-CM

## 2010-07-26 DIAGNOSIS — F329 Major depressive disorder, single episode, unspecified: Principal | ICD-10-CM

## 2010-07-26 LAB — ETHANOL: Alcohol, Ethyl (B): <5 mg/dL (ref 0–10)

## 2010-07-27 DIAGNOSIS — F329 Major depressive disorder, single episode, unspecified: Secondary | ICD-10-CM

## 2010-07-27 LAB — COMPREHENSIVE METABOLIC PANEL
ALT: 13 U/L (ref 0–35)
AST: 15 U/L (ref 0–37)
Albumin: 3.6 g/dL (ref 3.5–5.2)
Calcium: 9.2 mg/dL (ref 8.4–10.5)
GFR calc Af Amer: >60 mL/min (ref >60)
Sodium: 141 mEq/L (ref 135–145)
Total Protein: 6.8 g/dL (ref 6.0–8.3)

## 2010-07-29 LAB — DIFFERENTIAL
Basophils Relative: 0 % (ref 0–1)
Lymphs Abs: 1.4 10*3/uL (ref 0.7–4.0)
Monocytes Relative: 2 % — ABNORMAL LOW (ref 3–12)
Neutro Abs: 12.8 10*3/uL — ABNORMAL HIGH (ref 1.7–7.7)
Neutrophils Relative %: 88 % — ABNORMAL HIGH (ref 43–77)

## 2010-07-29 LAB — POCT URINALYSIS DIP (DEVICE)
Bilirubin Urine: NEGATIVE
Protein, ur: NEGATIVE mg/dL
Specific Gravity, Urine: 1.02 (ref 1.005–1.030)

## 2010-07-29 LAB — POCT PREGNANCY, URINE: Preg Test, Ur: NEGATIVE

## 2010-07-29 LAB — POCT I-STAT, CHEM 8
Chloride: 111 mEq/L (ref 96–112)
Glucose, Bld: 121 mg/dL — ABNORMAL HIGH (ref 70–99)
HCT: 45 % (ref 36.0–46.0)
Potassium: 3.7 mEq/L (ref 3.5–5.1)
Sodium: 141 mEq/L (ref 135–145)

## 2010-07-29 LAB — CBC
MCHC: 33.8 g/dL (ref 30.0–36.0)
Platelets: 405 10*3/uL — ABNORMAL HIGH (ref 150–400)
RBC: 4.55 MIL/uL (ref 3.87–5.11)
WBC: 14.6 10*3/uL — ABNORMAL HIGH (ref 4.0–10.5)

## 2010-07-29 LAB — RAPID URINE DRUG SCREEN, HOSP PERFORMED
Amphetamines: NOT DETECTED
Benzodiazepines: NOT DETECTED

## 2010-07-31 LAB — CBC
HCT: 19.9 % — ABNORMAL LOW (ref 36.0–46.0)
HCT: 30.5 % — ABNORMAL LOW (ref 36.0–46.0)
Hemoglobin: 10.7 g/dL — ABNORMAL LOW (ref 12.0–15.0)
MCHC: 34.7 g/dL (ref 30.0–36.0)
MCHC: 35 g/dL (ref 30.0–36.0)
MCV: 92.4 fL (ref 78.0–100.0)
Platelets: 190 10*3/uL (ref 150–400)
Platelets: 210 10*3/uL (ref 150–400)
RBC: 3.33 MIL/uL — ABNORMAL LOW (ref 3.87–5.11)
RDW: 12.6 % (ref 11.5–15.5)
RDW: 13.4 % (ref 11.5–15.5)
WBC: 18.7 10*3/uL — ABNORMAL HIGH (ref 4.0–10.5)

## 2010-07-31 LAB — RAPID URINE DRUG SCREEN, HOSP PERFORMED
Amphetamines: NOT DETECTED
Barbiturates: NOT DETECTED
Benzodiazepines: POSITIVE — AB
Cocaine: NOT DETECTED
Opiates: POSITIVE — AB
Tetrahydrocannabinol: NOT DETECTED

## 2010-07-31 LAB — TSH: TSH: 0.548 u[IU]/mL (ref 0.350–4.500)

## 2010-08-01 NOTE — Discharge Summary (Signed)
NAME:  Mary Ewing, Mary Ewing                ACCOUNT NO.:  [id number]  MEDICAL RECORD NO.:  [id number]           PATIENT TYPE:  I  LOCATION:  0501                          FACILITY:  [facility]  PHYSICIAN:  Marlis Edelson, DO        DATE OF BIRTH:  Mar 09, 1971  DATE OF ADMISSION:  07/26/2010 DATE OF DISCHARGE:  07/29/2010                              DISCHARGE SUMMARY   REASON FOR HOSPITALIZATION:  This is a 40 year old female, her second admission, who overdosed on approximately 4 tablets of Klonopin, unknown number of trazodone and inadvertently doubled her Prozac dose.  The patient has had some chronic conflict with her fiance.  FINAL IMPRESSION:  AXIS I:  Major depressive disorder NOS. AXIS II: No diagnosis. AXIS III: Post polypharmacy overdose, chronic left shoulder pain. AXIS IV: Severe relationship crisis. AXIS V: Current is 50-55.  LABORATORY DATA:  Urine drug screen positive for opiates and benzodiazepines.  CBC:  Hemoglobin of 13.3.  Liver enzymes normal.  SIGNIFICANT FINDINGS:  The patient was initially seen in the emergency room, initially sedated.  She was admitted to the adult milieu and integrated into the mood disorder group.  She was fully alert cooperative full contact with reality, blunted affect, but appropriate asking for help with depression.  Her speech was nonpressured.  Her mood was depressed.  Her thinking was logical and coherent, not psychotic and goal directed.  She was thinking it would be best to leave her fiance to go live with her parents.  She was just becoming exacerbated with his temper outbursts.  Thinking was logical, fund of knowledge was good.  We restarted her Prozac at 60 mg, continued her Klonopin and gabapentin. The patient was initially placed on a one-to-one for high fall risk with an unsteady gait.  She was calm and cooperative and improved.  She was ambulating on her own, attending groups.  Her affect was appropriate, smiling.  We contacted the  patient's fiancee to gather collateral information, assess safety concerns and provide information.  The patient was having further insight, was planning to move in with her parents and end her relationship.  She was tolerating her medications. We decreased her Klonopin and started Seroquel 50 mg q.h.s. we continued her Neurontin that we were using for anxiety and pain.  On day of discharge, the patient was fully alert.  She denied any suicidal thoughts.  She was having futuristic plans.  She was tolerating her medications.  We were going to contact family for any safety issues.  If there were no concerns, and there were none, the patient would be discharged.  She was discharged home to her family.  She showed no signs of hypomania, mania or overt anxiety.   DISCHARGE MEDICATIONS: 1. Klonopin 1 mg 1-1/2 tablets by mouth at bedtime. 2. Prozac 20 mg 3 daily. 3. Seroquel 1 tablet q.h.s. 4. Neurontin 300 mg taking 2 capsules t.i.d. 5. Ventolin inhaler as needed. 6. Vitamin B and D tablets.  FOLLOWUP:  Her follow-up appointment was at [facility] April 9 at 1:00 p.m. at phone number [phone].     Liborio Nixon  Orsini, N.P.   ______________________________ Marlis Edelson, DO    JO/MEDQ  D:  08/01/2010  T:  08/01/2010  Job:  [id number]  Electronically Signed by Limmie PatriciaP. on 08/01/2010 10:45:40 AM Electronically Signed by Marlis Edelson MD on 08/01/2010 09:07:57 PM

## 2010-08-02 ENCOUNTER — Emergency Department (HOSPITAL_COMMUNITY)
Admission: EM | Admit: 2010-08-02 | Discharge: 2010-08-02 | Disposition: A | Discharge status: Home / Self Care | Payer: Self-pay | Attending: Emergency Medicine | Admitting: Emergency Medicine

## 2010-08-02 DIAGNOSIS — W268XXA Contact with other sharp object(s), not elsewhere classified, initial encounter: Secondary | ICD-10-CM | POA: Insufficient documentation

## 2010-08-02 DIAGNOSIS — Y92009 Unspecified place in unspecified non-institutional (private) residence as the place of occurrence of the external cause: Secondary | ICD-10-CM | POA: Insufficient documentation

## 2010-08-02 DIAGNOSIS — Y998 Other external cause status: Secondary | ICD-10-CM | POA: Insufficient documentation

## 2010-08-02 DIAGNOSIS — F17200 Nicotine dependence, unspecified, uncomplicated: Secondary | ICD-10-CM | POA: Insufficient documentation

## 2010-08-02 DIAGNOSIS — S81009A Unspecified open wound, unspecified knee, initial encounter: Secondary | ICD-10-CM | POA: Insufficient documentation

## 2010-08-04 ENCOUNTER — Emergency Department (HOSPITAL_BASED_OUTPATIENT_CLINIC_OR_DEPARTMENT_OTHER)
Admission: EM | Admit: 2010-08-04 | Discharge: 2010-08-04 | Disposition: A | Discharge status: Home / Self Care | Payer: Self-pay | Attending: Emergency Medicine | Admitting: Emergency Medicine

## 2010-08-04 DIAGNOSIS — F341 Dysthymic disorder: Secondary | ICD-10-CM | POA: Insufficient documentation

## 2010-08-04 DIAGNOSIS — F17200 Nicotine dependence, unspecified, uncomplicated: Secondary | ICD-10-CM | POA: Insufficient documentation

## 2010-08-04 DIAGNOSIS — L02419 Cutaneous abscess of limb, unspecified: Secondary | ICD-10-CM | POA: Insufficient documentation

## 2010-08-07 ENCOUNTER — Emergency Department (HOSPITAL_BASED_OUTPATIENT_CLINIC_OR_DEPARTMENT_OTHER)
Admission: EM | Admit: 2010-08-07 | Discharge: 2010-08-07 | Disposition: A | Discharge status: Home / Self Care | Payer: Self-pay | Attending: Emergency Medicine | Admitting: Emergency Medicine

## 2010-08-07 ENCOUNTER — Emergency Department (INDEPENDENT_AMBULATORY_CARE_PROVIDER_SITE_OTHER): Payer: Self-pay

## 2010-08-07 DIAGNOSIS — F17200 Nicotine dependence, unspecified, uncomplicated: Secondary | ICD-10-CM | POA: Insufficient documentation

## 2010-08-07 DIAGNOSIS — S91009A Unspecified open wound, unspecified ankle, initial encounter: Secondary | ICD-10-CM

## 2010-08-07 DIAGNOSIS — S81809A Unspecified open wound, unspecified lower leg, initial encounter: Secondary | ICD-10-CM

## 2010-08-07 DIAGNOSIS — Z79899 Other long term (current) drug therapy: Secondary | ICD-10-CM | POA: Insufficient documentation

## 2010-08-07 DIAGNOSIS — X58XXXA Exposure to other specified factors, initial encounter: Secondary | ICD-10-CM

## 2010-08-07 DIAGNOSIS — S81009A Unspecified open wound, unspecified knee, initial encounter: Secondary | ICD-10-CM

## 2010-08-07 DIAGNOSIS — L089 Local infection of the skin and subcutaneous tissue, unspecified: Secondary | ICD-10-CM | POA: Insufficient documentation

## 2010-08-07 DIAGNOSIS — F341 Dysthymic disorder: Secondary | ICD-10-CM | POA: Insufficient documentation

## 2010-08-09 LAB — CBC
Hemoglobin: 11.5 g/dL — ABNORMAL LOW (ref 12.0–15.0)
MCHC: 34.4 g/dL (ref 30.0–36.0)
RBC: 3.64 MIL/uL — ABNORMAL LOW (ref 3.87–5.11)
WBC: 10.7 10*3/uL — ABNORMAL HIGH (ref 4.0–10.5)

## 2010-08-09 LAB — COMPREHENSIVE METABOLIC PANEL
ALT: 27 U/L (ref 0–35)
AST: 27 U/L (ref 0–37)
Alkaline Phosphatase: 63 U/L (ref 39–117)
CO2: 24 mEq/L (ref 19–32)
Calcium: 9.1 mg/dL (ref 8.4–10.5)
Chloride: 104 mEq/L (ref 96–112)
GFR calc Af Amer: >60 mL/min (ref >60)
GFR calc non Af Amer: >60 mL/min (ref >60)
Glucose, Bld: 92 mg/dL (ref 70–99)
Sodium: 136 mEq/L (ref 135–145)
Total Bilirubin: 0.5 mg/dL (ref 0.3–1.2)

## 2010-08-09 LAB — URINALYSIS, ROUTINE W REFLEX MICROSCOPIC
Bilirubin Urine: NEGATIVE
Ketones, ur: NEGATIVE mg/dL
Nitrite: NEGATIVE
Urobilinogen, UA: 0.2 mg/dL (ref 0.0–1.0)
pH: 6 (ref 5.0–8.0)

## 2010-08-09 LAB — DIFFERENTIAL
Basophils Absolute: 0 10*3/uL (ref 0.0–0.1)
Basophils Relative: 0 % (ref 0–1)
Eosinophils Absolute: 0.2 10*3/uL (ref 0.0–0.7)
Eosinophils Relative: 2 % (ref 0–5)
Lymphs Abs: 2.2 10*3/uL (ref 0.7–4.0)
Neutrophils Relative %: 69 % (ref 43–77)

## 2010-08-09 LAB — URINE MICROSCOPIC-ADD ON

## 2010-08-09 LAB — LIPASE, BLOOD: Lipase: 19 U/L (ref 11–59)

## 2010-08-09 LAB — PREGNANCY, URINE: Preg Test, Ur: POSITIVE

## 2010-08-14 ENCOUNTER — Emergency Department (HOSPITAL_BASED_OUTPATIENT_CLINIC_OR_DEPARTMENT_OTHER)
Admission: EM | Admit: 2010-08-14 | Discharge: 2010-08-14 | Disposition: A | Discharge status: Home / Self Care | Payer: Self-pay | Attending: Emergency Medicine | Admitting: Emergency Medicine

## 2010-08-14 DIAGNOSIS — T8130XA Disruption of wound, unspecified, initial encounter: Secondary | ICD-10-CM | POA: Insufficient documentation

## 2010-08-14 DIAGNOSIS — Z09 Encounter for follow-up examination after completed treatment for conditions other than malignant neoplasm: Secondary | ICD-10-CM | POA: Insufficient documentation

## 2010-08-14 DIAGNOSIS — Y849 Medical procedure, unspecified as the cause of abnormal reaction of the patient, or of later complication, without mention of misadventure at the time of the procedure: Secondary | ICD-10-CM | POA: Insufficient documentation

## 2010-09-06 NOTE — Op Note (Signed)
NAME:  Mary Ewing, Mary Ewing                ACCOUNT NO.:  [id number]   MEDICAL RECORD NO.:  [id number]          PATIENT TYPE:  INP   LOCATION:  9499                          [facility]:  [facility]   PHYSICIAN:  Allie Bossier, MD        DATE OF BIRTH:  11-01-1970   DATE OF PROCEDURE:  DATE OF DISCHARGE:                               OPERATIVE REPORT   INDICATION:  I was called emergently by the maternity admission unit to  inform me that the patient belonging to Dr. Tenny Craw, named Mary Ewing,  was in there and the fetal heart rate was in the 50s and 70s.  I  immediately called a stat C-section alerting the appropriate personnel,  and went to the MAU to meet Mary Ewing.  She apparently had fallen  earlier in the day, but did not begin feeling pain until the wee hours  of the morning when she came to the MAU.   PREOPERATIVE DIAGNOSIS:  Fetal bradycardia.   POSTOPERATIVE DIAGNOSES:  Fetal bradycardia plus abruption.   PROCEDURE:  Primary low transverse cesarean section, stat.   SURGEON:  Allie Bossier, MD   ANESTHESIA:  GETA, Belva Agee, MD   COMPLICATIONS:  None.   ESTIMATED BLOOD LOSS:  Approximately 600 mL from the surgery and there  was 1000 mL of abruption and clots behind the placenta.   FINDINGS:  Female infant with Apgars of 1, 4, and 5 at 1, 5, and 10  minutes, weight pending.  The cord pH of the umbilical artery was 6.65.  Next finding would be approximately 1000 mL of clotted blood behind the  placenta.  Please note the placenta delivered immediately behind the  baby.  Normal adnexa were noted.   DETAILED PROCEDURE AND FINDINGS:  The risks, benefits, and alternatives  of surgery were mentioned briefly; however, I told her that C-section  would be necessary to try to resuscitate this baby.  She understood and  she was taken to the operating room emergently.  General anesthesia was  applied without complication.  Please note that just prior to  intubation, the maternal heart beat was  noted to be in the 80s as was  the fetal heart beat.  I declined to spend time waiting for an  ultrasound and instead as soon as she was intubated, I made a transverse  incision approximately 2 cm above the symphysis pubis, sharply carried  it down to the fascia.  Fascia was incised in the midline with the  knife.  The pyramidalis muscles were separated in midline with the knife  and the rectus muscles were pulled to each side.  The perineum was  entered bluntly with my finger and the peritoneal incision was extended  with traction.  A bladder blade was placed and a transverse incision was  made on the moderately well-developed lower uterine segment.  Amniotomy  was performed with a knife.  The uterine incision was extended with  traction and clear fluid was noted.  The baby was delivered from a  vertex presentation.  It was completely floppy and appeared  drained of  blood.  Cord was clamped and cut and was transferred to the NICU  personnel.  The placenta spontaneously delivered itself immediately  after the delivery of the baby.  A cord gas from the umbilical artery  was obtained and the placenta was sent to pathology.  I extracted at  least a liter of firmly clotted blood from behind the placenta in the  uterus.  The uterine interior was cleaned with a dry lap sponge.  The  uterus was left in situ.  The uterine incision was closed with two  layers of 2-0 Vicryl suture in a running locking fashion, second layer  imbricated the first.  Excellent hemostasis was noted.  By tilting the  now firm uterus, I was able to visualize the adnexa.  They both appeared  normal.  At this point, the uterine incision and the rectus muscles were  again inspected and noted to be hemostatic.  The rectus fascia was  closed with a 0 Vicryl running and locking suture.  No defects were  palpable.  The subcutaneous tissue was irrigated, cleaned, and dried.  The subcutaneous tissue was then infiltrated with 30 mL  of 0.5%  Marcaine.  A subcuticular closure was done with 3-0 Vicryl suture.  Steri-Strips were placed.  Foley catheter drained clear urine  throughout.  She was taken to recovery in stable condition.      Allie Bossier, MD  Electronically Signed     MCD/MEDQ  D:  11/09/2008  T:  11/09/2008  Job:  [id number]

## 2010-09-06 NOTE — H&P (Signed)
NAME:  Mary Ewing, Mary Ewing                ACCOUNT NO.:  [id number]   MEDICAL RECORD NO.:  [id number]          PATIENT TYPE:  INP   LOCATION:  9316                          [facility]  [facility]   PHYSICIAN:  Kendra H. Tenny Craw, MD     DATE OF BIRTH:  May 23, 1970   DATE OF ADMISSION:  11/09/2008  DATE OF DISCHARGE:                              HISTORY & PHYSICAL   CHIEF COMPLAINT:  Cramping and vomiting.   HISTORY OF PRESENT ILLNESS:  Mary Ewing is a 40 year old gravida 6,  para 0-0-5-0 Caucasian female at 35 weeks and 4 days who presents with a  chief complaint of vomiting and abdominal pain.  She notes that  approximately an hour prior to admission she developed severe lower  abdominal pain and cramping.  She began sweating and developed vomiting.  At this point she was brought to maternity admissions. At around 4:00 in  the afternoon on July 18, the patient did experience a fall where her  feet went out from under her and she landed on her bottom.  After the  fall she had no cramping or contractions, bleeding or leaking of fluid.  She endorsed active fetal movement. Throughout the rest of the evening  she had no problems.  She went to bed and was awakened by severe  cramping, sweating and vomiting. Upon arrival to maternity admissions  unit, the patient was placed on the external fetal monitor. Fetal heart  tones were found to be in the 50's. This was at approximately 4:21 in  the morning. Pulse oximeter was placed on the patient and her heartbeat  was found to be in the 80's. Fetal heart tones still remained in the  50's.  There were a few instances where heart rate of the 130's was  obtained. However, per nursing report the rhythm did not sound like a  normal rhythm. Teaching service attending, Dr. Marice Potter, was immediately  notified.  The patient was brought to the operating room immediately.  The patient was in the operating room by 4:32. Skin incision was 4:44.  Delivery of baby was 4:45.   PAST  MEDICAL HISTORY:  Significant for  1. Advanced maternal age and history of a bipolar mood disorder.  2. History of suicide attempt x2.  3. Tobacco abuse.   PAST SURGICAL HISTORY:  Status post suction D and C x5.  Status post  diagnostic laparoscopy and appendectomy in 2003.   PAST OBSTETRICAL HISTORY:  Gravida 6, para 0 0 5 0, status post 5 prior  elective pregnancy terminations.   PAST GYN HISTORY:  History of an abnormal Pap at age 52 status post  cryoablative therapy.   SOCIAL HISTORY:  Positive for tobacco abuse.   FAMILY HISTORY:  Noncontributory.   PRENATAL LABS:  Blood type A+, antibody screen negative, RPR  nonreactive, rubella titer immune, hepatitis B surface antigen negative,  HIV nonreactive.  Pap smear within normal limits.  Gonorrhea and  Chlamydia negative.  Quad screen was performed and abnormal. The patient  was seen by [facility] for evaluation. A level II  ultrasound was performed,  which demonstrated a normal fetal anatomy  without any ultrasound markers for aneuploidy. An amniocentesis was  performed which gave a 37 XY karyotype.  One-hour Glucola was 135.   PHYSICAL EXAM:  GENERAL:  Alert and oriented, diaphoretic, vomiting  ABDOMEN:  Firm gravid.  Fetal heart tones 50's.   ASSESSMENT AND PLAN:  This is a 40 year old gravida 6, para 0 0 5 0 at  35 weeks and 4 days admitted with fetal bradycardia for emergent primary  low transverse cesarean section under general anesthesia. At the time of  delivery approximately 1 liter of blood was noted within the uterus.  Placenta immediately delivered after the baby, consistent with complete  placental abruption. Apgars at the time of delivery were 1 at 1 minute,  4 at 5 minutes, 5 at 10 minutes.  Cord pH was obtained and was 6.65.      Freddrick March. Tenny Craw, MD  Electronically Signed     KHR/MEDQ  D:  11/09/2008  T:  11/09/2008  Job:  [id number]

## 2010-09-06 NOTE — Discharge Summary (Signed)
NAME:  Mary Ewing, Mary Ewing                ACCOUNT NO.:  [id number]   MEDICAL RECORD NO.:  [id number]          PATIENT TYPE:  INP   LOCATION:  9316                          [facility]  [facility]   PHYSICIAN:  Gerrit Friends. Aldona Bar, M.D.   DATE OF BIRTH:  01-13-1971   DATE OF ADMISSION:  11/09/2008  DATE OF DISCHARGE:  11/13/2008                               DISCHARGE SUMMARY   DISCHARGE DIAGNOSES:  1. A 35-week and 4-day intrauterine pregnancy, delivered, female infant      with Apgars of 1, 4, and 5.  2. Blood type A positive.  3. Placental abruption.  4. Post delivery anemia.   PROCEDURE:  Emergency cesarean section.   SUMMARY:  This 40 year old patient, gravida 6, para 0, presented at  early in the morning on November 09, 2008 with abdominal pain and upon at  assessment was noted to have significant fetal bradycardia.  Apparently,  this was preceded on the evening of November 08, 2008, by a fall.  She was  taken to the operating room immediately for a stat cesarean section,  which was carried out with the findings of a complete placental  abruption at approximately 1000 mL of blood inside the uterus.  Baby was  subsequently taken to the Newborn Intensive Care Unit.  The mother's  post operative course was relatively benign, although she did develop a  significant anemia with hemoglobin of 6.9 at the time of discharge with  a white count of 10,900 and platelet count of 210,000.  When the patient  presented on the morning of November 09, 2008, her hemoglobin was 10.7.   The mother remained afebrile during her hospital course.  Infant's  course in the neonatal nursery was relatively stable.  On the morning of  November 13, 2008, mother was pumping and ambulating without difficulty.  Vital signs were normal.  Wound was clean and dry.  She was comfortable.  Uterus was firm.  Bleeding was minimal and the baby was in fourth day  postop.  She was discharged to home in stable condition.   Discharge medications include  vitamins one a day, ferrous sulfate 325 mg  twice daily, and she will continue on her antenatal medications provided  by her primary care, which include Prozac 20 mg a day, nadolol 10 mg  daily, and over-the-counter Zantac as needed for indigestion.  She was  also given a prescription for Tylox to use 1-2 every 4-6 hours as needed  for severe pain or cramps.   She was given an instruction sheet at time of discharge and understood  all instructions well.  Her wound was closed with a subcuticular closure  and she was instructed on care of the Steri-Strips and removal of the  Steri-Strips in approximately 3-4 days time.  Follow up in the office  approximately in 4 weeks or as needed.   CONDITION ON DISCHARGE:  Improved.     Gerrit Friends. Aldona Bar, M.D.  Electronically Signed    RMW/MEDQ  D:  11/13/2008  T:  11/13/2008  Job:  [id number]

## 2010-09-09 NOTE — Discharge Summary (Signed)
NAME:  Mary Ewing, Mary Ewing                ACCOUNT NO.:  [id number]   MEDICAL RECORD NO.:  [id number]          PATIENT TYPE:  IPS   LOCATION:  0307                          [facility]   PHYSICIAN:  Anselm Jungling, MD  DATE OF BIRTH:  [birth year]   DATE OF ADMISSION:  03/20/2006  DATE OF DISCHARGE:  03/23/2006                               DISCHARGE SUMMARY   IDENTIFYING DATA/REASON FOR ADMISSION:  This was an inpatient  psychiatric admission for [name], a 40 year old Caucasian female admitted  in the aftermath of an overdose.  She came to [place] with a history of  multiple prior suicide attempts.  She was on numerous psychotropic  medications including Prozac, Wellbutrin, Cymbalta, Klonopin, Topamax,  Ambien, and lithium, as well as numerous nonpsychotropic medications  (see below).  The patient works as a Designer, jewellery.  She had been  living in [place], [place], but was now staying with parents in  [place].  She had also been given Seroquel at [facility] on  the day prior to admission.  She had been seen in consultation there by.  . .   Dictation ended at this point.      Anselm Jungling, MD  Electronically Signed     SPB/MEDQ  D:  04/27/2006  T:  04/27/2006  Job:  [id number]

## 2010-09-09 NOTE — Discharge Summary (Signed)
NAME:  Mary Ewing, Mary Ewing                ACCOUNT NO.:  [id number]   MEDICAL RECORD NO.:  [id number]          PATIENT TYPE:  IPS   LOCATION:  0307                          [facility]   PHYSICIAN:  Anselm Jungling, MD  DATE OF BIRTH:  11/19/1970   DATE OF ADMISSION:  03/20/2006  DATE OF DISCHARGE:  03/23/2006                               DISCHARGE SUMMARY   This is continuing dictation of an earlier job.   IDENTIFYING DATA AND REASON FOR ADMISSION:  The patient was also on  numerous nonpsychotropic medications (see below).  She had been seen in  consultation by Dr. Jeanie Sewer, [facility], a psychiatrist,  prior to admission.  She had also been seen in consultation by Dr.  Hollice Espy, internal medicine, and by Dr. Silvano Rusk, a psychologist.  She  had also been seeing a neurologist at [facility], for migraine headache, who  had prescribed Topamax.  She denied any substance abuse issues.  She  verbalized a strong desire for help.  This was her first inpatient  psychiatric admission ever.  She described the end of a relationship  with her boyfriend, low self-esteem, a recent abortion all the sources  of stress and depression.  Please refer to the admission note for  further details pertaining to the symptoms, circumstances, and history  that led to her hospitalization.  She was given an initial Axis I  diagnosis of mood disorder NOS.   MEDICAL AND LABORATORY:  The patient came to [place] with numerous  nonpsychotropic medications, based on medical problems of chronic pain  involving her neck and shoulder and a recent history of aspiration  pneumonia.  She came to [place] on the following nonpsychotropic medications,  including Imitrex, Darvocet, Flexeril, Fioricet, multivitamin, Slow-Mag,  DHEA, naproxen OTC, Augmentin, unison, Ultram, Tylenol, Protonix, and  Compazine.  She was followed closely by the nurse practitioner  throughout her inpatient stay.  There were no significant medical  issues.  We did endeavor to simplify her medication regimen to the  extent it was possible during her inpatient stay.   HOSPITAL COURSE:  The patient was admitted to the adult inpatient  psychiatric service.  She presented as a well-nourished, well-developed  female who was alert, fully oriented, and of above-average intelligence  and education.  There were no signs or symptoms of psychosis or thought  disorder.  Her mood was depressed with sad affect.  However, she stated  I am happy as hell to be here.   Her psychotropic regimen consisted of Prozac 40 mg daily and Wellbutrin  XL up to 300 mg daily, and Cymbalta 60 mg daily.  She was continued on  Klonopin 1 mg twice daily, and Ambien CR 12.5 mg daily for sleep.  Ultram was utilized in doses of 50-100 mg as needed for pain.   She participated in various therapeutic groups and activities, and by  the third hospital day reported I feel great.  She felt that her  psychotropic medications were of benefit to her.  She also reported that  she felt she benefited from group therapy.  Her sleep  stabilized nicely.   By the fourth hospital day, the patient appeared appropriate for  discharge, and verbalized her desire to leave.  She denied suicidal  ideation and reported that she was pleased with her treatment.  She  agreed to the following aftercare program.   AFTERCARE:  The patient was to follow-up with Dr. Raquel James [facility]  on April 11, 2006.   DISCHARGE MEDICATIONS:  1. Prozac 40 mg daily.  2. Wellbutrin XL 300 mg daily.  3. Cymbalta 60 mg daily.  4. Klonopin 1 mg b.i.d.  5. Ambien CR 12.5 mg daily.  6. Ultram 100 mg as needed for pain up to 3 times daily.   DISCHARGE DIAGNOSES:  AXIS I:  Depressive disorder NOS.  AXIS II:  Deferred.  AXIS III:  History of chronic neck and shoulder pain, status post  aspiration pneumonia.  AXIS IV:  Stressors severe.  AXIS V:  GAF on discharge 65.      Anselm Jungling, MD   Electronically Signed     SPB/MEDQ  D:  04/27/2006  T:  04/28/2006  Job:  [id number]

## 2010-09-09 NOTE — Discharge Summary (Signed)
NAME:  Mary Ewing, Mary Ewing                ACCOUNT NO.:  [id number]   MEDICAL RECORD NO.:  [id number]          PATIENT TYPE:  IPS   LOCATION:  0307                          [facility]   PHYSICIAN:  Alanda Amass, M.D.   DATE OF BIRTH:  [birth year]   DATE OF ADMISSION:  03/20/2006  DATE OF DISCHARGE:  03/23/2006                               DISCHARGE SUMMARY   ADDENDUM:  This is a discharge addendum.  The patient was initially  dictated on March 17, 2006, but did not leave until March 20, 2006.  This was due to her not being able to find placement at a mental  facility.  The patient had come in with suicidal ideations and needed  placement, but due to insurance reasons, was unable to get this quickly.  She was eventually placed at [facility] and signed voluntary  commitment papers.  I believe involuntary commitment papers were also  signed since there was some issue about possibly not being able to be  accepted without these.  The patient had an uneventful hospital course  after her last discharge summary was done.  She was continued on her  Ultram for migraines, Augmentin for her aspiration pneumonia, and her  rhabdomyolysis resolved.   DIAGNOSIS BY PSYCHIATRY:  Mood disorder not otherwise specified,  depressed.  Rule out bipolar type 2.   I believe we did start low-dose Seroquel at the end of her  hospitalization to help her with her mood and with sleep.  However, I do  not believe we sent her out on this medication.   MEDICATIONS:  She was sent out on were the ones in her previous  discharge summary and included Augmentin and tramadol.           ______________________________  Alanda Amass, M.D.     JH/MEDQ  D:  09/15/2006  T:  09/15/2006  Job:  [id number]

## 2010-09-09 NOTE — Op Note (Signed)
[facility]  Patient:    Mary Ewing, Mary Ewing Visit Number: [id number] MRN: [id number]          Service Type: Attending:  Abigail Miyamoto, M.D. Dictated by:   Abigail Miyamoto, M.D.                             Operative Report  PREOPERATIVE DIAGNOSIS:  Abdominal pain of uncertain etiology.  POSTOPERATIVE DIAGNOSIS:  Abdominal pain of uncertain etiology.  PROCEDURE: 1. Diagnostic laparoscopy. 2. Laparoscopic appendectomy.  SURGEON:  Abigail Miyamoto, M.D.  ANESTHESIA:  General endotracheal.  INDICATION:  Mary Ewing is a 40 year old female who presented with gradual onset of right lower quadrant abdominal pain and fever. She was found to be tender on abdominal exam. She had a CAT scan of her abdomen which showed questionable dilation at the tip of her appendix. She briefly got better with conservative management but again presented a week later with abdominal pain and again on CAT scan was found to have the same findings with no other abnormalities. Given her persistent pain and fever, the decision was made to proceed with diagnostic laparoscopy.  FINDINGS:  The patient was found to have a normal appearing appendix. She had minimal fluid in the cul-de-sac of the pelvis. She did have a left ovarian cyst and mildly inflamed uterus but no other abnormalities. The small and large bowel as well as the liver, stomach, and gallbladder appeared normal.  PROCEDURE IN DETAIL:  The patient was brought to the operating room, identified as Mary Ewing. She was placed supine on the operating table and general anesthesia was induced. Next, her abdomen was prepped and draped in the usual sterile fashion. Using a #15 blade, a small transverse incision was made below the umbilicus. The incision was carried down the fascia which was then opened with the scalpel. Hemostat was used to pass through the peritoneal cavity. A 0 Vicryl pursestring suture was then placed  around the fascial opening. The Hasson port was placed through the opening and insufflation of the abdomen was begun. A quick view of the abdomen revealed normal appearing small and large bowel. Her uterus appeared to be mildly inflamed and there was a mild amount of clear fluid in the cul-de-sac. Both ovaries were examined and the left ovary appeared to contain an ovarian cyst. The liver, gallbladder, and stomach appeared normal as well. A 5 mm port was placed in the patients right upper quadrant and a 12 mm port was placed in the patients midline at the pubis. The appendix was then identified and found to be retrocecal. It was elevated and the mesoappendix was taken down after dissecting with the laparoscopic stapler. The appendix was then transected with the stapler as well. The appendix was found to be a little bit longer than expected so the base was dissected out further and another stapling was fired across the base. Both parts of the appendix were then removed from the abdomen. Hemostasis appeared to be achieved. The abdomen was then copiously irrigated with normal saline. Again, the appendiceal stump and right lower quadrant was examined again. Hemostasis again appeared to be well achieved. All ports were then removed under direct vision. The abdomen was deflated. The 0 Vicryl at the umbilicus was then tied in place to close the fascial defect and separate 0 Vicryl suture was placed at the lower incision. All incisions were then anesthetized with 0.25% Marcaine and closed with 4-0 Monocryl subcuticular sutures.  Steri-Strips and gauze and tape were then applied. The patient tolerated the procedure well. All sponge, needle, and instrument counts were correct at the end of the procedure. The patient was then extubated in the operating room and taken in stable condition to recovery. Dictated by:   Abigail Miyamoto, M.D. Attending:  Abigail Miyamoto, M.D. DD:  05/02/01 TD:   05/02/01 Job: [id number]

## 2010-09-09 NOTE — Discharge Summary (Signed)
NAME:  Mary Ewing, Mary Ewing                ACCOUNT NO.:  [id number]   MEDICAL RECORD NO.:  [id number]          PATIENT TYPE:  INP   LOCATION:  3031                         FACILITY:  [facility]   PHYSICIAN:  Alanda Amass, M.D.   DATE OF BIRTH:  1970-06-10   DATE OF ADMISSION:  03/14/2006  DATE OF DISCHARGE:  03/17/2006                                 DISCHARGE SUMMARY   Discharge is likely going to be March 17, 2006, to an inpatient  psychiatric rehab facility.   PRIMARY CARE PHYSICIAN:  Unassigned.   CONSULTATIONS:  Renal and psychiatry, Dr. Jeanie Sewer.   DISCHARGE DIAGNOSES:  1. Overdose on Ambien, Klonopin, and lithium.  2. Bipolar/mood disorder not otherwise specified.  3. Rhabdomyolysis.  4. Aspiration pneumonia.  5. Headaches.  6. Insomnia.  7. Acute renal failure.   PROCEDURES:  1. A chest x-ray which was suspicious for a left lower lobe pneumonia      aspiration.  2. Head CT which was negative.  3. A femoral dialysis catheter was placed.  4. Hemodialysis times one.   DISCHARGE MEDICATIONS:  1. Augmentin 500 mg one tablet p.o. b.i.d. times five more days.  2. Tramadol 50 mg one tablet by mouth q.6h. as needed for migraines.      Whoever ends up taking care of her at the inpatient psychiatric      facility, please feel free to change this pain medication.  We added it      while she was in the hospital because she was taken off all her other      medications including her psychiatric ones.   FOLLOWUP ISSUES:  The patient will need acute inpatient psychiatric  rehabilitation.  She will also probably need to be put on a mood stabilizer  to be determined by her psychiatric physician at the inpatient facility.   DISCHARGE CONDITION:  The patient is medically stable.  She has good urine  output.  Her creatinine is within normal limits.  She is alert and oriented,  able to answer questions.  Her affect is still depressed.  She continues to  cry and be hopeless.   DISCHARGE  LABS:  Creatinine kinase level 385, down from 975.  Lithium level  0.36 which is down from 4.50.  The patient's CMP at discharge was within  normal limits, albumin was slightly low at 2.9, creatinine was 0.7.  The CBC  was within normal limits except for a slightly elevated white blood cell  count of 13.4 on March 15, 2006.  Hepatitis B surface antigen was  negative.  Blood culture no growth to date.  Urine pregnancy test negative.  Tricyclics screen was positive.  Urine drug screen positive for  barbiturates, benzodiazepines.  Aspirin level was less than 4.  Acetaminophen level less than 10.  Alcohol level less than 5.   HOSPITAL COURSE:  The patient is a 40 year old female with a history of  depression and history of five suicides/overdose attempts with a possible  history of bipolar disorder who overdosed on Klonopin, Ambien, and lithium  after she was given some critical feedback at  her new job as a Engineer, civil (consulting).  1. Overdose/psychiatric issues.  The patient was admitted to the hospital      and was able to respond to voice.  However, she was very disoriented at      this time.  Her lithium level was found to be above 4.  Renal was      consulted, and she underwent hemodialysis times one which lowered her      lithium level to around 1.  Her lithium level subsequently decreased      throughout her hospitalization.  The patient was admitted to the ICU      for close observation.  She recovered quickly after her hemodialysis.      By the time of discharge she is probably back to her baseline.      However, she does have a very depressed affect.  Dr. Jeanie Sewer did see      the patient and felt that she needed inpatient psychiatric care since      she has failed outpatient treatment.  The patient was not continued on      any of her psychiatric medications or any of her pain medications while      she was in the hospital.  We will leave this up to the inpatient      psychiatric facility.  The  patient did have a head CT which was      negative since she was having altered mental status when she first came      in.  2. Rhabdomyolysis.  The patient's CK was checked and found to be in the      900s.  Her creatinine was initially 1.1 but subsequently decreased to      normal.  She was hydrated with lactated Ringers, and her urine output      was watched.  At time of discharge her rhabdomyolysis is stable, and      her creatinine kinase continues to decrease.  3. Aspiration pneumonia.  The patient had a chest x-ray on admission that      appeared to possibly be aspiration pneumonia.  She was started on      Unasyn which was subsequently changed to Augmentin.  She will continue      on this until she finishes a seven-day course.  She has had no more      episodes of shortness of breath or difficulty breathing since she has      been in the hospital.  She was initially put on albuterol and Atrovent      nebulizers, but I do not believe she required many of these while she      was in the ICU.  4. Migraine headaches.  The patient continuously complained about her      migraine headaches for which she was on multiple medications previously      including Topamax, Flexeril, Fioricet, and Darvocet.  The patient was      not continued on any of these medications in the hospital.  She was      given Tramadol 50 mg one tablet every six hours p.r.n. pain which did      help with the migraines.  Tylenol and naproxen were also tried but did      not seem to help.  She had mentioned wanting to go back on Imitrex.      However, due to the effect that it might have with SSRIs, we decided  not to do this.  5. Insomnia.  The patient continued to complain of insomnia while in the      hospital.  She did overdose on Ambien, so we did not give her any      Ambien while she was here.  It will be up to her psychiatrist whether     or not he feels that she should start on this again.   She will be  discharged to an inpatient rehab psychiatric facility yet to be  determined.           ______________________________  Alanda Amass, M.D.     JH/MEDQ  D:  03/17/2006  T:  03/17/2006  Job:  [id number]

## 2010-09-09 NOTE — H&P (Signed)
[facility]  Patient:    Mary Ewing, PFLUG Visit Number: [id number] MRN: [id number]          Service Type: EMS Location: ED Attending Physician:  Devoria Albe Dictated by:   Currie Paris, M.D. Admit Date:  04/17/2001 Discharge Date: 04/17/2001                           History and Physical  VISIT NUMBER:  [id number]  CHIEF COMPLAINT:  Abdominal pain.  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 having awakened about 4 a.m. with fairly sudden onset of abdominal pain which was in the lower abdomen and then around to her right flank.  It was not fully associated with significant nausea or vomiting, and she had no fever, chills, or diarrhea.  She actually thinks that she has been off and on having some lower abdominal pain from about two weeks ago, starting about four or five days before her menstrual cycle.  She had a normal period.  She has not had any other Gyn problems in the interval, although she apparently has a history of thought to having some problems with endometriosis, and is followed by Dr. Seymour Bars for that, but has never had an laparoscopy, although that has been apparently discussed in the past.  The patient is otherwise in pretty good health.  She has has ear surgery in the past.  MEDICATIONS:  She is on no medications.  ALLERGIES:  SULFA.  SOCIAL HISTORY:  She does smoke some.  REVIEW OF SYSTEMS:  She apparently had an urinary tract infection in November.  PHYSICAL EXAMINATION:  GENERAL:  The patient is afebrile, alert, awake, and uncomfortable, although has had some Toradol about 6 a.m. and apparently had IV fluids.  She also had some Demerol about 9:30, and I initially examined her approximately at the time that Demerol was given, and then again at approximately 10:45.  LUNGS:  Clear.  HEART:  Regular.  ABDOMEN:  Basically soft, a little  tenderness in the low abdomen, but really soft in the right lower quadrant, and did not really appreciate any significant guarding.  Bowel sounds were normal.  PELVIC AND RECTAL:  Not done.  EXTREMITIES:  Showed no cyanosis or edema.  LABORATORY STUDIES:  Showed a white count of 12,000, but there really was not a left shift with neutrophils being normal and slight increase in some atypical lymphs.  Chemistries were normal.  Urinalysis was negative.  CT scan initially done without contrast for kidney stone was negative, but there was some question that her appendix might be enlarged.  She then had a CT with contrast and I have reviewed those films with Dr. Maple Hudson.  There is contrast in the appendix and definite evidence of acute appendicitis.  There is no free fluid in the pelvis and ovaries areas looked normal.  IMPRESSION:  Abdominal pain of uncertain etiology, possibly a very early appendicitis, but also possibly some gynecology endometriosis-related problem.  PLAN:  Since the patient appears comfortable now, will let her go on some oral pain medication.  If she gets worse today, she will come back to the emergency room to reevaluate, and again, I can see her tomorrow morning if she is not completely well, and reevaluate her, and possibly repeat her CT depending on the clinical situation and also repeat her lab studies.  I discussed that with  her and her family, and she understands and will proceed to those plans. Dictated by:   Currie Paris, M.D. Attending Physician:  Devoria Albe DD:  04/17/01 TD:  04/18/01 Job: [id number]

## 2010-09-09 NOTE — Consult Note (Signed)
NAME:  Mary Ewing, Mary Ewing                ACCOUNT NO.:  [id number]   MEDICAL RECORD NO.:  [id number]          PATIENT TYPE:  INP   LOCATION:  3031                         FACILITY:  [facility]   PHYSICIAN:  Antonietta Breach, M.D.  DATE OF BIRTH:  Nov 12, 1970   DATE OF CONSULTATION:  DATE OF DISCHARGE:                                 CONSULTATION   REASON FOR CONSULTATION:  Suicide attempt with overdose.   HISTORY OF PRESENT ILLNESS:  Mary Ewing is a 40 year old single female  admitted to the [facility] on March 14, 2006, due to an  overdose with Lithium, Klonopin and Ambien with clear suicidal intent.   The patient continues with very depressed mood, easy crying, low energy  and poor concentration.  She admits to suicidal intent.  She continues  with thoughts of hopelessness and helplessness.  She does not have any  racing thoughts, hallucinations or delusions.  She is cooperative with  her bedside care.   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  She also stopped her lithium one month  ago.   PAST PSYCHIATRIC HISTORY:  The patient describes periods of at least  four days long when she was in her 9s where she would have a decreased  need for sleep, increased thoughts, elevated mood, and these periods  have no longer been occurring in her 30s.  She has a history of five  suicide attempts.  The first attempt was when she was 40 years old.  The  patient describes a second attempt occurring in 2004 after her boyfriend  left her.  She attempted an overdose then.   After her boyfriend got back together with her, she became pregnant.  The day after her abortion, she discovered several females on his call  list and had intercepted a phone call by one of the females that day.  She then proceeded with another suicide attempt with an overdose.   After the patient began working in [place] at a hospital after leaving  [place], she was not having any friends and could not get involved  socially.  She re-entered a severe depression and slit her wrists in the  bathtub.  She drank wine and took pills to go to sleep but she woke up  with the wrists still hemorrhaging.  She stopped the bleeding and  bandaged them herself.  She received no medical attention with any of  those attempts.  With the overdoses, she woke up the next day and with  the wrist cuts, she treated them herself.   The patient has been on Prozac in the past as well as Cymbalta and  lithium.  She does not have any history of known delusions or  hallucinations.   FAMILY PSYCHIATRIC HISTORY:  First degree female relatives have  depression.   GENERAL MEDICAL PROBLEMS:  Endometriosis, migraine headaches.   MEDICATIONS:  The MAR is reviewed.  The patient is not on any current  psychotropics.   ALLERGIES:  Sulfa.   LABORATORY DATA:  Complete metabolic panel shows  the potassium at 3.  The SGOT is 28, SGPT is 27, albumin 2.8.  Magnesium is 2.3.  The lithium  level has fallen from 1.63 on November 22 down to 0.88 today at 0300.  Her urine drug screen was positive for barbiturates, positive for  benzodiazepines, and positive for tricyclics.  Her alcohol level was  negative.   Head CT on November 21 without contrast was negative.   SOCIAL HISTORY:  The patient was born in [place].  She has been  educated with an R.N.  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  It was at that time that she moved out  of [place] to [place].  She has no children.  She has never been  married.  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  She does not use alcohol or legal drugs.  She is staying with  her mother at this time.   REVIEW OF SYSTEMS:   CONSTITUTIONAL:  Afebrile.  HEAD:  No trauma.  EYES:  No visual changes.  EARS:  No hearing impairment.  NOSE:  No rhinorrhea.  MOUTH AND THROAT:  No sore throat.  NEUROLOGIC:  Unremarkable.  PSYCHIATRIC:  As above.  CARDIOVASCULAR:  No chest pain, palpitations or  edema.  RESPIRATORY:  No coughing or wheezing.  GASTROINTESTINAL:  No  nausea, vomiting or diarrhea.  GENITOURINARY:  No dysuria.  SKIN:  Unremarkable.  ENDOCRINE/METABOLIC:  Unremarkable.  MUSCULOSKELETAL:  No  deformities.  HEMATOLOGIC/LYMPHATIC:  Unremarkable.   PHYSICAL EXAMINATION:  VITAL SIGNS:  Temperature 98.3.  Pulse 73.  Respirations 18.  Blood pressure 99/63.  O2 saturation on room air 94%.  MENTAL STATUS:  Mary Ewing is a young female, lying in a partially  reclined supine position in her hospital bed, appearing her stated age.  She has intermittent eye contact.  She has downcast facies with  constricted affect and tears.  Her mood is depressed. Thought content:  She acknowledges suicidal intent.  She has thoughts of hopelessness and  helplessness.  She has no delusions or hallucinations.  Her fund of  knowledge and intelligence are within normal limits.  Her thought  process is logical, coherent and goal directed.  No looseness of  associations.  No racing thoughts.  Speech involves normal rate and  prosody.  Her psychomotor tone is decreased.  Her concentration is  decreased.  Her judgment is impaired for functioning outside of a  hospital, however, she is able to cooperate with medical care.  Her  insight is intact for her need for treatment of her mood symptoms.  Her  memory is intact to immediate, recent and remote except for the overdose  intoxication.  She is oriented to all spheres.   ASSESSMENT:  AXIS I:  293.83, mood disorder, not otherwise specified,  depressed, rule out bipolar II disorder, depressed.  AXIS II:  Deferred. AXIS III:  See general medical problems.  The patient is status post   polysubstance overdose.  AXIS IV:  Occupational, primary support group.  AXIS V:  20.   Mary Ewing is still at risk to harm herself.  She has a history of  multiple suicidal attempts and is currently in a severe depression with  hospitalization due to an acute suicide attempt and overdose.  She has  not responded to outpatient care.   RECOMMENDATIONS:  1. Continue the patient's sitter for suicide precautions.  2. Psychotropic medication is deferred.  3. Recommend that the patient be admitted to a psychiatric unit for      further evaluation and treatment when she is medically cleared.  4. The patient will require a resumption for a primary mood stabilizer      and possibly acute __________.      Antonietta Breach, M.D.  Electronically Signed     JW/MEDQ  D:  03/16/2006  T:  03/16/2006  Job:  [id number]

## 2010-09-27 NOTE — H&P (Signed)
NAME:  Mary Ewing, Mary Ewing                ACCOUNT NO.:  [id number]  MEDICAL RECORD NO.:  [id number]           PATIENT TYPE:  I  LOCATION:  0501                          FACILITY:  [facility]  PHYSICIAN:  Marlis Edelson, DO        DATE OF BIRTH:  Jul 19, 1970  DATE OF ADMISSION:  07/26/2010 DATE OF DISCHARGE:                      PSYCHIATRIC ADMISSION ASSESSMENT   IDENTIFICATION:  This is a 40 year old single Caucasian female.  This is a voluntary admission.  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  She took this overdose on April 2 after her fiance flew into a rage and was acting out at home, had broken dishes and thrown things.  They have had some chronic conflict and he has displayed some recent behavior issues.  Today, Mary Ewing is fully alert, endorses some ongoing issues with depression which she also attributes to some recent job difficulties.  She regrets the overdose and is formulating a plan to reduce her stressors.  PAST PSYCHIATRIC HISTORY:  Second [facility] admission.  She has previous admission to our unit in November 2007 also for stabilization after an overdose.  She has a history of several suicide attempts.  Also one previous admission to our medical unit and possibly an admission to [facility], [facility], [facility].  She denies a history of substance abuse. She is currently followed as an outpatient at [facility] health.  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  She lives at home with her fiance of 3 years and her 54-month-old son.  Her fiance is the father of the son. She has had  a lot of recent pressures from him to find a job.  No legal charges.  Her parents live in town, and she is hoping to move in with them with her son.  FAMILY HISTORY:  Denies family history of mental illness or substance abuse.  MEDICAL HISTORY: 1. Post polypharmacy overdose. 2. Chronic left shoulder pain.  CURRENT MEDICATIONS: 1. Klonopin 1 mg 1/2 tablet daily p.r.n. for anxiety and 2 tablets     q.h.s. 2. Gabapentin 300 mg 1 tablet q.a.m. 2 tablets h.s. 3. Fluoxetine 20 mg 4 tablets daily. 4. Ventolin inhaler 2 puffs q.4 h p.r.n. for asthma. 5. Vitamin D over-the-counter 1 tablet daily. 6. Vitamin B over-the-counter 1 tablet daily.  DRUG ALLERGIES:  Sulfa and trazodone.  PHYSICAL EXAMINATION:  Physical exam was done in the emergency room and is noted in the record.  She was significantly sedated on arrival here to our unit after the overdose and did some vomiting yesterday, but presents today fully alert in no acute physical distress.  Presenting vital signs temperature 97.5, pulse 58, respirations 16, blood pressure 99/78, pulse ox 100%.  Urine  drug screen positive for opiates and benzodiazepines.  Urinalysis negative for acute findings.  CBC normal hemoglobin 13.3.  Acetaminophen, salicylate and alcohol screens were all negative.  Basic chemistry is negative.  BUN 8, creatinine 0.61.  Liver enzymes are normal.  This is a normally-developed Caucasian female with a smooth motor exam.  No abnormal movements.  Nonfocal neuro exam.  MENTAL STATUS EXAM:  Fully alert female, cooperative, in full contact with reality, blunted affect but appropriate, asking for help with her depression.  Speech nonpressured.  Mood depressed, thinking logical and coherent, nonpsychotic and goal-directed.  She is thinking it would be best to leave her fiance and go to live with her parents.  She is exasperated with his temper outbursts.  Thinking is logical.  Fund of knowledge good.  Impulse control  and judgment are normal.  Insight good. Denying any suicidal thoughts today, regretting the overdose that she took she is glad she lived, wants to be a good mother to her son Mary Ewing. Working Statistician completely intact.  Intelligence above average.  DISCHARGE DIAGNOSES:  AXIS I:  Major depressive disorder NOS. AXIS II:  No diagnosis. AXIS III:  Post polypharmacy overdose, chronic left shoulder pain. AXIS IV:  Severe relationship crisis. AXIS V:  Current 38, past year not known.  PLAN:  Plan is to voluntarily admit her with a goal of alleviating her suicidal thoughts hoping to improve her coping and help stabilize her situation.  She has agreed to allow [place] to talk with her parents.  She has signed a 72-hour request for discharge and will get some input from her family.  They were planning on having her follow up with her continued treatment at the [facility].  We are going to restart her Prozac at 60 mg daily.  Continue her current Klonopin and gabapentin.     Margaret A. Lorin Picket, N.P.   ______________________________ Marlis Edelson, DO    MAS/MEDQ  D:  07/27/2010  T:  07/27/2010  Job:  [id number]  Electronically Signed by Kari Baars N.P. on 08/05/2010 12:20:40 PM Electronically Signed by Nelly Rout MD on 09/27/2010 02:42:55 PM

## 2010-10-12 ENCOUNTER — Emergency Department (INDEPENDENT_AMBULATORY_CARE_PROVIDER_SITE_OTHER): Payer: Self-pay

## 2010-10-12 ENCOUNTER — Emergency Department (HOSPITAL_BASED_OUTPATIENT_CLINIC_OR_DEPARTMENT_OTHER)
Admission: EM | Admit: 2010-10-12 | Discharge: 2010-10-12 | Disposition: A | Discharge status: Home / Self Care | Payer: Self-pay | Attending: Emergency Medicine | Admitting: Emergency Medicine

## 2010-10-12 DIAGNOSIS — G8929 Other chronic pain: Secondary | ICD-10-CM | POA: Insufficient documentation

## 2010-10-12 DIAGNOSIS — M25529 Pain in unspecified elbow: Secondary | ICD-10-CM

## 2010-10-12 DIAGNOSIS — M6580 Other synovitis and tenosynovitis, unspecified site: Secondary | ICD-10-CM | POA: Insufficient documentation

## 2011-01-16 LAB — COMPREHENSIVE METABOLIC PANEL WITH GFR
ALT: 40 — ABNORMAL HIGH
Alkaline Phosphatase: 65
CO2: 26
GFR calc non Af Amer: >60
Glucose, Bld: 90
Potassium: 4.3
Sodium: 140
Total Protein: 6

## 2011-01-16 LAB — COMPREHENSIVE METABOLIC PANEL
AST: 40 — ABNORMAL HIGH
Albumin: 3.1 — ABNORMAL LOW
BUN: 13
Calcium: 9
Chloride: 109
Creatinine, Ser: 0.59
GFR calc Af Amer: >60
Total Bilirubin: 0.4

## 2011-01-16 LAB — PREGNANCY, URINE: Preg Test, Ur: NEGATIVE

## 2011-04-06 ENCOUNTER — Encounter: Payer: Self-pay | Admitting: *Deleted

## 2011-04-06 ENCOUNTER — Other Ambulatory Visit: Payer: Self-pay

## 2011-04-06 ENCOUNTER — Emergency Department (HOSPITAL_COMMUNITY): Payer: Self-pay

## 2011-04-06 ENCOUNTER — Inpatient Hospital Stay (HOSPITAL_COMMUNITY)
Admission: EM | Admit: 2011-04-06 | Discharge: 2011-04-10 | DRG: 195 | Disposition: A | Discharge status: Home / Self Care | Payer: Self-pay | Attending: Family Medicine | Admitting: Family Medicine

## 2011-04-06 DIAGNOSIS — R1012 Left upper quadrant pain: Secondary | ICD-10-CM

## 2011-04-06 DIAGNOSIS — Z9189 Other specified personal risk factors, not elsewhere classified: Secondary | ICD-10-CM

## 2011-04-06 DIAGNOSIS — F411 Generalized anxiety disorder: Secondary | ICD-10-CM | POA: Diagnosis present

## 2011-04-06 DIAGNOSIS — R51 Headache: Secondary | ICD-10-CM | POA: Diagnosis not present

## 2011-04-06 DIAGNOSIS — R0902 Hypoxemia: Secondary | ICD-10-CM

## 2011-04-06 DIAGNOSIS — R1031 Right lower quadrant pain: Secondary | ICD-10-CM

## 2011-04-06 DIAGNOSIS — M549 Dorsalgia, unspecified: Secondary | ICD-10-CM

## 2011-04-06 DIAGNOSIS — M25519 Pain in unspecified shoulder: Secondary | ICD-10-CM

## 2011-04-06 DIAGNOSIS — IMO0002 Reserved for concepts with insufficient information to code with codable children: Secondary | ICD-10-CM

## 2011-04-06 DIAGNOSIS — R110 Nausea: Secondary | ICD-10-CM | POA: Diagnosis present

## 2011-04-06 DIAGNOSIS — F3289 Other specified depressive episodes: Secondary | ICD-10-CM | POA: Diagnosis present

## 2011-04-06 DIAGNOSIS — J45909 Unspecified asthma, uncomplicated: Secondary | ICD-10-CM

## 2011-04-06 DIAGNOSIS — I10 Essential (primary) hypertension: Secondary | ICD-10-CM | POA: Diagnosis present

## 2011-04-06 DIAGNOSIS — J189 Pneumonia, unspecified organism: Principal | ICD-10-CM | POA: Diagnosis present

## 2011-04-06 DIAGNOSIS — M542 Cervicalgia: Secondary | ICD-10-CM

## 2011-04-06 DIAGNOSIS — F17200 Nicotine dependence, unspecified, uncomplicated: Secondary | ICD-10-CM | POA: Diagnosis present

## 2011-04-06 DIAGNOSIS — R0602 Shortness of breath: Secondary | ICD-10-CM | POA: Diagnosis present

## 2011-04-06 DIAGNOSIS — K219 Gastro-esophageal reflux disease without esophagitis: Secondary | ICD-10-CM | POA: Diagnosis present

## 2011-04-06 DIAGNOSIS — F329 Major depressive disorder, single episode, unspecified: Secondary | ICD-10-CM

## 2011-04-06 HISTORY — DX: Personal history of suicidal behavior: Z91.51

## 2011-04-06 HISTORY — DX: Depression, unspecified: F32.A

## 2011-04-06 HISTORY — DX: Anxiety disorder, unspecified: F41.9

## 2011-04-06 HISTORY — DX: Major depressive disorder, single episode, unspecified: F32.9

## 2011-04-06 HISTORY — DX: Unspecified asthma, uncomplicated: J45.909

## 2011-04-06 HISTORY — DX: Personal history of self-harm: Z91.5

## 2011-04-06 HISTORY — DX: Gastro-esophageal reflux disease without esophagitis: K21.9

## 2011-04-06 LAB — BASIC METABOLIC PANEL
BUN: 3 mg/dL — ABNORMAL LOW (ref 6–23)
CO2: 25 mEq/L (ref 19–32)
Glucose, Bld: 95 mg/dL (ref 70–99)
Potassium: 3.3 mEq/L — ABNORMAL LOW (ref 3.5–5.1)
Sodium: 137 mEq/L (ref 135–145)

## 2011-04-06 LAB — DIFFERENTIAL
Eosinophils Relative: 1 % (ref 0–5)
Lymphocytes Relative: 18 % (ref 12–46)
Lymphs Abs: 2.5 10*3/uL (ref 0.7–4.0)
Monocytes Relative: 10 % (ref 3–12)
Neutrophils Relative %: 71 % (ref 43–77)

## 2011-04-06 LAB — CBC
HCT: 30.9 % — ABNORMAL LOW (ref 36.0–46.0)
Hemoglobin: 10.5 g/dL — ABNORMAL LOW (ref 12.0–15.0)
Hemoglobin: 9.9 g/dL — ABNORMAL LOW (ref 12.0–15.0)
MCH: 27.9 pg (ref 26.0–34.0)
MCH: 27.3 pg (ref 26.0–34.0)
MCV: 84.6 fL (ref 78.0–100.0)
MCV: 85.4 fL (ref 78.0–100.0)
Platelets: 278 10*3/uL (ref 150–400)
RBC: 3.77 MIL/uL — ABNORMAL LOW (ref 3.87–5.11)
RBC: 3.62 MIL/uL — ABNORMAL LOW (ref 3.87–5.11)
WBC: 14 10*3/uL — ABNORMAL HIGH (ref 4.0–10.5)

## 2011-04-06 LAB — CREATININE, SERUM: Creatinine, Ser: 0.72 mg/dL (ref 0.50–1.10)

## 2011-04-06 LAB — MAGNESIUM: Magnesium: 2 mg/dL (ref 1.5–2.5)

## 2011-04-06 LAB — PRO B NATRIURETIC PEPTIDE: Pro B Natriuretic peptide (BNP): 730.9 pg/mL — ABNORMAL HIGH (ref 0–125)

## 2011-04-06 LAB — PHOSPHORUS: Phosphorus: 4 mg/dL (ref 2.3–4.6)

## 2011-04-06 MED ORDER — ONDANSETRON HCL 4 MG/2ML IJ SOLN
4.0000 mg | Freq: Once | INTRAMUSCULAR | Status: AC
  Administered 2011-04-06: 4 mg via INTRAVENOUS
  Filled 2011-04-06: qty 2

## 2011-04-06 MED ORDER — DEXTROSE 5 % IV SOLN
1.0000 g | Freq: Once | INTRAVENOUS | Status: AC
  Administered 2011-04-06: 1 g via INTRAVENOUS
  Filled 2011-04-06: qty 10

## 2011-04-06 MED ORDER — INFLUENZA VIRUS VACC SPLIT PF IM SUSP: 0.5000 mL | INTRAMUSCULAR | Status: DC | PRN

## 2011-04-06 MED ORDER — MOXIFLOXACIN HCL IN NACL 400 MG/250ML IV SOLN
400.0000 mg | INTRAVENOUS | Status: DC
  Administered 2011-04-07: 400 mg via INTRAVENOUS
  Filled 2011-04-06 (×2): qty 250

## 2011-04-06 MED ORDER — FLUOXETINE HCL 20 MG PO CAPS
60.0000 mg | ORAL_CAPSULE | Freq: Every day | ORAL | Status: DC
  Administered 2011-04-07 – 2011-04-10 (×4): 60 mg via ORAL
  Filled 2011-04-06 (×4): qty 3

## 2011-04-06 MED ORDER — KETOROLAC TROMETHAMINE 30 MG/ML IJ SOLN
30.0000 mg | Freq: Once | INTRAMUSCULAR | Status: AC
  Administered 2011-04-06: 30 mg via INTRAVENOUS
  Filled 2011-04-06: qty 1

## 2011-04-06 MED ORDER — ACETAMINOPHEN 325 MG PO TABS
ORAL_TABLET | ORAL | Status: AC
  Administered 2011-04-06: 650 mg via ORAL
  Filled 2011-04-06: qty 2

## 2011-04-06 MED ORDER — ACETAMINOPHEN 325 MG PO TABS
650.0000 mg | ORAL_TABLET | Freq: Four times a day (QID) | ORAL | Status: DC | PRN
  Administered 2011-04-06 – 2011-04-07 (×2): 650 mg via ORAL
  Filled 2011-04-06: qty 2

## 2011-04-06 MED ORDER — AZITHROMYCIN 1 G PO PACK
1.0000 g | PACK | ORAL | Status: AC
Start: 2011-04-06 — End: 2011-04-06
  Administered 2011-04-06: 1 g via ORAL
  Filled 2011-04-06: qty 1

## 2011-04-06 MED ORDER — ONDANSETRON HCL 4 MG PO TABS: 4.0000 mg | ORAL_TABLET | Freq: Four times a day (QID) | ORAL | Status: DC | PRN

## 2011-04-06 MED ORDER — GABAPENTIN 600 MG PO TABS
600.0000 mg | ORAL_TABLET | Freq: Four times a day (QID) | ORAL | Status: AC
  Administered 2011-04-06 – 2011-04-09 (×12): 600 mg via ORAL
  Filled 2011-04-06 (×18): qty 1

## 2011-04-06 MED ORDER — SODIUM CHLORIDE 0.9 % IV BOLUS (SEPSIS)
1000.0000 mL | Freq: Once | INTRAVENOUS | Status: AC
  Administered 2011-04-06: 1000 mL via INTRAVENOUS

## 2011-04-06 MED ORDER — METHADONE HCL 40 MG PO TBSO
110.0000 mg | ORAL_TABLET | Freq: Every day | ORAL | Status: DC
  Filled 2011-04-06: qty 3

## 2011-04-06 MED ORDER — PNEUMOCOCCAL VAC POLYVALENT 25 MCG/0.5ML IJ INJ: 0.5000 mL | INJECTION | INTRAMUSCULAR | Status: DC | PRN

## 2011-04-06 MED ORDER — ONDANSETRON HCL 4 MG/2ML IJ SOLN
4.0000 mg | Freq: Four times a day (QID) | INTRAMUSCULAR | Status: DC | PRN
  Administered 2011-04-07: 4 mg via INTRAVENOUS
  Filled 2011-04-06: qty 2

## 2011-04-06 MED ORDER — MECLIZINE HCL 25 MG PO TABS
25.0000 mg | ORAL_TABLET | Freq: Three times a day (TID) | ORAL | Status: DC | PRN
  Filled 2011-04-06: qty 1

## 2011-04-06 MED ORDER — ACETAMINOPHEN 325 MG PO TABS
650.0000 mg | ORAL_TABLET | Freq: Once | ORAL | Status: AC
  Administered 2011-04-06: 650 mg via ORAL
  Filled 2011-04-06: qty 2

## 2011-04-06 MED ORDER — PANTOPRAZOLE SODIUM 40 MG PO TBEC
40.0000 mg | DELAYED_RELEASE_TABLET | Freq: Every day | ORAL | Status: DC
  Administered 2011-04-07 – 2011-04-10 (×4): 40 mg via ORAL
  Filled 2011-04-06 (×4): qty 1

## 2011-04-06 MED ORDER — SODIUM CHLORIDE 0.9 % IV SOLN
INTRAVENOUS | Status: DC
  Administered 2011-04-06 – 2011-04-08 (×3): via INTRAVENOUS

## 2011-04-06 MED ORDER — ZOLPIDEM TARTRATE 10 MG PO TABS
10.0000 mg | ORAL_TABLET | Freq: Every evening | ORAL | Status: DC | PRN
Start: 2011-04-06 — End: 2011-04-10
  Administered 2011-04-06 – 2011-04-09 (×4): 10 mg via ORAL
  Filled 2011-04-06 (×4): qty 1

## 2011-04-06 MED ORDER — NICOTINE 21 MG/24HR TD PT24
21.0000 mg | MEDICATED_PATCH | Freq: Every day | TRANSDERMAL | Status: DC
  Administered 2011-04-06 – 2011-04-10 (×6): 21 mg via TRANSDERMAL
  Filled 2011-04-06 (×6): qty 1

## 2011-04-06 MED ORDER — POTASSIUM CHLORIDE 20 MEQ/15ML (10%) PO LIQD
40.0000 meq | Freq: Once | ORAL | Status: AC
  Administered 2011-04-06: 40 meq via ORAL
  Filled 2011-04-06: qty 30
  Filled 2011-04-06: qty 15

## 2011-04-06 MED ORDER — SENNA 8.6 MG PO TABS
1.0000 | ORAL_TABLET | Freq: Two times a day (BID) | ORAL | Status: DC
  Administered 2011-04-06 – 2011-04-10 (×7): 8.6 mg via ORAL
  Filled 2011-04-06 (×7): qty 1

## 2011-04-06 MED ORDER — ENOXAPARIN SODIUM 40 MG/0.4ML ~~LOC~~ SOLN
40.0000 mg | SUBCUTANEOUS | Status: DC
  Administered 2011-04-06 – 2011-04-09 (×4): 40 mg via SUBCUTANEOUS
  Filled 2011-04-06 (×5): qty 0.4

## 2011-04-06 NOTE — H&P (Signed)
PCP:  Evette Georges, MD   DOA:  04/06/2011 12:44 PM  Chief Complaint:  Shortness of breath  HPI: Pt is 40 yo female with PMH outlined below who presents to [facility] ED with main concern with progressively worsening SOB associated with productive cough of yellowish sputum, fevers, chills. The symptoms started approximately 1-2 weeks prior to admission amd have not been getting better even with some over the counter products. Pt also reports right ear drainage, clear and non bloody over the past few days. She reports taking her father's Levaquin in addition to OTC medications and this has not help either. Ear drainage is now improving but SOB is still present. She denies any other aggravating or alleviating factors, no abdominal or urinary concerns, no headaches, no other systemic symptoms, no changes in vision or neck stiffness. She denies recent sicknesses or exposures and no recent traveling.  Allergies: Allergies  Allergen Reactions  . Sulfonamide Derivatives Hives    REACTION: hives    Prior to Admission medications   Medication Sig Start Date End Date Taking? Authorizing Provider  FLUoxetine (PROZAC) 20 MG tablet Take 60 mg by mouth daily.     Yes Historical Provider, MD  gabapentin (NEURONTIN) 600 MG tablet Take 600 mg by mouth 4 (four) times daily.     Yes Historical Provider, MD  levofloxacin (LEVAQUIN) 250 MG tablet Take 250 mg by mouth daily. STARTED THERAPY Saturday CONTINUE 1 WEEK    Yes Historical Provider, MD  meclizine (ANTIVERT) 25 MG tablet Take 25 mg by mouth 3 (three) times daily as needed. DIZZINESS    Yes Historical Provider, MD  METHADONE HCL PO Take 110 mg by mouth daily. PATIENT GOES TO [facility] FOR METHADONE    Yes Historical Provider, MD  Multiple Vitamins-Minerals (MULTIVITAMINS THER. W/MINERALS) TABS Take 1 tablet by mouth daily.     Yes Historical Provider, MD  omeprazole (PRILOSEC) 20 MG capsule Take 20 mg by mouth daily.     Yes  Historical Provider, MD  vitamin B-12 (CYANOCOBALAMIN) 1000 MCG tablet Take 1,000 mcg by mouth daily.     Yes Historical Provider, MD  zolpidem (AMBIEN) 10 MG tablet Take 10 mg by mouth at bedtime as needed. FOR SLEEP    Yes Historical Provider, MD    Past Medical History  Diagnosis Date  . GERD (gastroesophageal reflux disease)   . Asthmatic bronchitis     history of  . Anxiety   . Depression   . H/O: suicide attempt     x 3    Past Surgical History  Procedure Date  . Cesarean section   . Appendectomy 2004  . Right tympanoplasty 1999  . Tonsillectomy   . Refractive surgery     Social History:  reports that she has been smoking Cigarettes.  She has a 24 pack-year smoking history. She has never used smokeless tobacco. She reports that she does not drink alcohol or use illicit drugs.  History reviewed. No pertinent family history.  Review of Systems:  Constitutional: Denies diaphoresis, appetite change and fatigue.  HEENT: Denies photophobia, eye pain, redness, hearing loss, congestion, sore throat, rhinorrhea, sneezing, mouth sores, trouble swallowing, neck pain, neck stiffness and tinnitus.   Respiratory: Denies chest tightness,  and wheezing.   Cardiovascular: Denies chest pain, palpitations and leg swelling.  Gastrointestinal: Denies nausea, vomiting, abdominal pain, diarrhea, constipation, blood in stool and abdominal distention.  Genitourinary: Denies dysuria, urgency, frequency, hematuria, flank pain and difficulty urinating.  Musculoskeletal: Denies myalgias,  back pain, joint swelling, arthralgias and gait problem.  Skin: Denies pallor, rash and wound.  Neurological: Denies dizziness, seizures, syncope, weakness, light-headedness, numbness and headaches.  Hematological: Denies adenopathy. Easy bruising, personal or family bleeding history  Psychiatric/Behavioral: Denies suicidal ideation, mood changes, confusion, nervousness, sleep disturbance and agitation  Physical  Exam:  Filed Vitals:   04/06/11 1316 04/06/11 1545  BP: 124/67 109/63  Pulse: 102 87  Temp: 100.4 F (38 C)   TempSrc: Oral   Resp: 24 25  Height: 5\' 2"  (1.575 m)   Weight: 78.019 kg (172 lb)   SpO2: 95% 94%    Constitutional: Vital signs reviewed.  Patient is a well-developed and well-nourished in no acute distress and cooperative with exam. Alert and oriented x3.  Head: Normocephalic and atraumatic Ear: TM normal bilaterally Mouth: no erythema or exudates, MMM Eyes: PERRL, EOMI, conjunctivae normal, No scleral icterus.  Neck: Supple, Trachea midline normal ROM, No JVD, mass, thyromegaly, or carotid bruit present.  Cardiovascular: RRR, HR is between 80-110, S1 normal, S2 normal, no MRG, pulses symmetric and intact bilaterally Pulmonary/Chest: CTAB, no wheezes, rales. Rhonchi with minimal bibasilar crackles. Abdominal: Soft. Non-tender, non-distended, bowel sounds are normal, no masses, organomegaly, or guarding present.  GU: no CVA tenderness Musculoskeletal: No joint deformities, erythema, or stiffness, ROM full and no nontender Ext: no edema and no cyanosis, pulses palpable bilaterally (DP and PT) Hematology: no cervical, inginal, or axillary adenopathy.  Neurological: A&O x3, Strenght is normal and symmetric bilaterally, cranial nerve II-XII are grossly intact, no focal motor deficit, sensory intact to light touch bilaterally.  Skin: Warm, dry and intact. No rash, cyanosis, or clubbing.  Psychiatric: Normal mood and affect. speech and behavior is normal. Judgment and thought content normal. Cognition and memory are normal.   Labs on Admission:  Results for orders placed during the hospital encounter of 04/06/11 (from the past 48 hour(s))  CBC     Status: Abnormal   Collection Time   04/06/11  1:38 PM      Component Value Range Comment   WBC 14.0 (*) 4.0 - 10.5 (K/uL)    RBC 3.77 (*) 3.87 - 5.11 (MIL/uL)    Hemoglobin 10.5 (*) 12.0 - 15.0 (g/dL)    HCT 09.8 (*) 11.9 - 46.0  (%)    MCV 84.6  78.0 - 100.0 (fL)    MCH 27.9  26.0 - 34.0 (pg)    MCHC 32.9  30.0 - 36.0 (g/dL)    RDW 14.7  82.9 - 56.2 (%)    Platelets 278  150 - 400 (K/uL)   DIFFERENTIAL     Status: Abnormal   Collection Time   04/06/11  1:38 PM      Component Value Range Comment   Neutrophils Relative 71  43 - 77 (%)    Neutro Abs 10.0 (*) 1.7 - 7.7 (K/uL)    Lymphocytes Relative 18  12 - 46 (%)    Lymphs Abs 2.5  0.7 - 4.0 (K/uL)    Monocytes Relative 10  3 - 12 (%)    Monocytes Absolute 1.4 (*) 0.1 - 1.0 (K/uL)    Eosinophils Relative 1  0 - 5 (%)    Eosinophils Absolute 0.1  0.0 - 0.7 (K/uL)    Basophils Relative 0  0 - 1 (%)    Basophils Absolute 0.0  0.0 - 0.1 (K/uL)   BASIC METABOLIC PANEL     Status: Abnormal   Collection Time   04/06/11  1:38 PM  Component Value Range Comment   Sodium 137  135 - 145 (mEq/L)    Potassium 3.3 (*) 3.5 - 5.1 (mEq/L)    Chloride 101  96 - 112 (mEq/L)    CO2 25  19 - 32 (mEq/L)    Glucose, Bld 95  70 - 99 (mg/dL)    BUN 3 (*) 6 - 23 (mg/dL)    Creatinine, Ser 1.61  0.50 - 1.10 (mg/dL)    Calcium 09.6  8.4 - 10.5 (mg/dL)    GFR calc non Af Amer >90  >90 (mL/min)    GFR calc Af Amer >90  >90 (mL/min)     Radiological Exams on Admission:  CXR: IMPRESSION:  Bilateral perihilar and lower lobes patchy airspace disease left greater than right suspicious for bilateral pneumonia or less likely pulmonary edema.  Assessment/Plan Principal Problem:  *PNA (pneumonia) - pt's symptoms and CXR findings consistent with PNA - will admit to medical floor and treat conservatively with IVF, antibiotic - will also provide oxygen if needed and monitor vitals per floor protocol - encourage incentive spirometry  Active Problems:  Nausea  - order zofran prn - continue IVF   HYPERTENSION NEC - currently well controlled off antihypertensives   Disposition - plan of care and diagnosis, diagnostic studies and test results were discussed with pt - pt  verbalized understanding  Time Spent on Admission: Over 30 minutes  MAGICK-Hatsumi Steinhart 04/06/2011, 5:32 PM

## 2011-04-06 NOTE — ED Notes (Signed)
Pt states that she developed an ear infection 2 weeks ago Monday. States that she started taking her father's Levoquin this past Sunday. Pt states that she has had a cough and fever for past week. Pt highly anxious and continues to talk when told to concentrate oh her breathing. Pt also states that she gets PNA every year and thinks that she may have it again. Pt is febrile with oral temp of 100.89F. Pt states that she took Excedrin with tylenol approximately 10am prior to coming to the ER. Bilateral breath sounds diminished. Pt orignally saturating at 77% on room air. Pt placed on 3L O2 and saturating at 95-97% a this time.

## 2011-04-06 NOTE — ED Provider Notes (Signed)
History     CSN: [id number] Arrival date & time: 04/06/2011 12:44 PM   First MD Initiated Contact with Mary Ewing 04/06/11 1336      No chief complaint on file.   (Consider location/radiation/quality/duration/timing/severity/associated sxs/prior treatment) HPI Comments: Mary Ewing states that over the last one to 2 weeks she's had some cough and URI type symptoms.  She also noted some drainage out of her right ear where she has had frequent problems with ear infections before.  She started taking her father's Levaquin and using his eardrops to combat her infection.  She has noted that her ear is significantly improved.  She did have a few days where she felt like her cough and shortness of breath also improved.  She comes in today because she's now had increasing cough again with some shortness of breath.  She denies any past history of asthma but does smoke cigarettes.  Mary Ewing has had fevers at home.  Her mother has also noted that she's intermittently been confused and believes that they've had conversations about things that they have not discussed.  Mary Ewing is a 40 y.o. female presenting with shortness of breath. The history is provided by the Mary Ewing. No language interpreter was used.  Shortness of Breath  The current episode started 3 to 5 days ago. The onset was gradual. The problem occurs continuously. The problem has been gradually worsening. The problem is moderate. The symptoms are relieved by nothing. The symptoms are aggravated by nothing. Associated symptoms include a fever, cough and shortness of breath. Pertinent negatives include no chest pain.    History reviewed. No pertinent past medical history.  History reviewed. No pertinent past surgical history.  History reviewed. No pertinent family history.  History  Substance Use Topics  . Smoking status: Not on file  . Smokeless tobacco: Not on file  . Alcohol Use: Not on file    OB History    Grav Para Term Preterm Abortions  TAB SAB Ect Mult Living                  Review of Systems  Constitutional: Positive for fever. Negative for chills.  HENT: Positive for ear discharge.   Eyes: Negative.  Negative for discharge and redness.  Respiratory: Positive for cough and shortness of breath.   Cardiovascular: Negative.  Negative for chest pain.  Gastrointestinal: Positive for nausea. Negative for vomiting, abdominal pain and diarrhea.  Genitourinary: Negative.  Negative for dysuria and vaginal discharge.  Musculoskeletal: Negative.  Negative for back pain.  Skin: Negative.  Negative for color change and rash.  Neurological: Negative.  Negative for syncope and headaches.  Hematological: Negative.  Negative for adenopathy.  Psychiatric/Behavioral: Positive for confusion.  All other systems reviewed and are negative.    Allergies  Sulfonamide derivatives  Home Medications   Current Outpatient Rx  Name Route Sig Dispense Refill  . FLUOXETINE HCL 20 MG PO TABS Oral Take 60 mg by mouth daily.      Marland Kitchen GABAPENTIN 600 MG PO TABS Oral Take 600 mg by mouth 4 (four) times daily.      Marland Kitchen LEVOFLOXACIN 250 MG PO TABS Oral Take 250 mg by mouth daily. STARTED THERAPY Saturday CONTINUE 1 WEEK     . MECLIZINE HCL 25 MG PO TABS Oral Take 25 mg by mouth 3 (three) times daily as needed. DIZZINESS     . METHADONE HCL PO Oral Take 110 mg by mouth daily. Mary Ewing GOES TO [facility] FOR METHADONE     .  THERA M PLUS PO TABS Oral Take 1 tablet by mouth daily.      Marland Kitchen OMEPRAZOLE 20 MG PO CPDR Oral Take 20 mg by mouth daily.      Marland Kitchen VITAMIN B-12 1000 MCG PO TABS Oral Take 1,000 mcg by mouth daily.      Marland Kitchen ZOLPIDEM TARTRATE 10 MG PO TABS Oral Take 10 mg by mouth at bedtime as needed. FOR SLEEP       BP 124/67  Pulse 102  Temp(Src) 100.4 F (38 C) (Oral)  Resp 24  Ht 5\' 2"  (1.575 m)  Wt 172 lb (78.019 kg)  BMI 31.46 kg/m2  SpO2 95%  Physical Exam  Constitutional: She is oriented to person, place, and time.  She appears well-developed and well-nourished.  Non-toxic appearance. She does not have a sickly appearance.  HENT:  Head: Normocephalic and atraumatic.  Eyes: Conjunctivae, EOM and lids are normal. Pupils are equal, round, and reactive to light. No scleral icterus.  Neck: Trachea normal and normal range of motion. Neck supple.  Cardiovascular: Normal rate, regular rhythm and normal heart sounds.   Pulmonary/Chest: Effort normal. No respiratory distress. She has no wheezes. She has rales.       Mary Ewing has bilateral crackles in the bases.  Abdominal: Soft. Normal appearance. There is no tenderness. There is no rebound, no guarding and no CVA tenderness.  Musculoskeletal: Normal range of motion.  Neurological: She is alert and oriented to person, place, and time. She has normal strength.  Skin: Skin is warm, dry and intact. No rash noted.  Psychiatric: She has a normal mood and affect. Her behavior is normal. Judgment and thought content normal.    ED Course  Procedures (including critical care time)  Results for orders placed during the hospital encounter of 04/06/11  CBC      Component Value Range   WBC 14.0 (*) 4.0 - 10.5 (K/uL)   RBC 3.77 (*) 3.87 - 5.11 (MIL/uL)   Hemoglobin 10.5 (*) 12.0 - 15.0 (g/dL)   HCT 16.1 (*) 09.6 - 46.0 (%)   MCV 84.6  78.0 - 100.0 (fL)   MCH 27.9  26.0 - 34.0 (pg)   MCHC 32.9  30.0 - 36.0 (g/dL)   RDW 04.5  40.9 - 81.1 (%)   Platelets 278  150 - 400 (K/uL)  DIFFERENTIAL      Component Value Range   Neutrophils Relative 71  43 - 77 (%)   Neutro Abs 10.0 (*) 1.7 - 7.7 (K/uL)   Lymphocytes Relative 18  12 - 46 (%)   Lymphs Abs 2.5  0.7 - 4.0 (K/uL)   Monocytes Relative 10  3 - 12 (%)   Monocytes Absolute 1.4 (*) 0.1 - 1.0 (K/uL)   Eosinophils Relative 1  0 - 5 (%)   Eosinophils Absolute 0.1  0.0 - 0.7 (K/uL)   Basophils Relative 0  0 - 1 (%)   Basophils Absolute 0.0  0.0 - 0.1 (K/uL)  BASIC METABOLIC PANEL      Component Value Range   Sodium 137   135 - 145 (mEq/L)   Potassium 3.3 (*) 3.5 - 5.1 (mEq/L)   Chloride 101  96 - 112 (mEq/L)   CO2 25  19 - 32 (mEq/L)   Glucose, Bld 95  70 - 99 (mg/dL)   BUN 3 (*) 6 - 23 (mg/dL)   Creatinine, Ser 9.14  0.50 - 1.10 (mg/dL)   Calcium 78.2  8.4 - 10.5 (mg/dL)   GFR  calc non Af Amer >90  >90 (mL/min)   GFR calc Af Amer >90  >90 (mL/min)   Dg Chest 2 View  04/06/2011  *RADIOLOGY REPORT*  Clinical Data: Fever, chest pain, shortness of breath  CHEST - 2 VIEW  Comparison: 04/16/2009  Findings: Cardiomediastinal silhouette is stable.  Bilateral perihilar and lower lobes patchy airspace disease left greater than right suspicious for bilateral pneumonia or less likely pulmonary edema. Mild degenerative changes thoracic spine.  IMPRESSION:  Bilateral perihilar and lower lobes patchy airspace disease left greater than right suspicious for bilateral pneumonia or less likely pulmonary edema.  Original Report Authenticated By: Natasha Mead, M.D.      Date: 04/06/2011  Rate: 97  Rhythm: normal sinus rhythm  QRS Axis: normal  Intervals: normal  ST/T Wave abnormalities: normal  Conduction Disutrbances:none  Narrative Interpretation:   Old EKG Reviewed: unchanged from 07/25/2010.    MDM  Mary Ewing with symptoms consistent with pneumonia with history of cough and fevers.  Mary Ewing is also noted to be hypoxic off oxygen.  Mary Ewing be given azithromycin and ceftriaxone for community-acquired pneumonia at this point in time.  On 4 L of O2 by nasal cannula the Mary Ewing is oxygen saturations in the mid-90s.  I discussed this Mary Ewing with Dr. Izola Price from [facility] for admission for pneumonia.  Mary Ewing will have an influenza PCR added given that she'll be in Mary Ewing with significant respiratory illness.        Nat Christen, MD 04/06/11 [phone]

## 2011-04-06 NOTE — ED Notes (Signed)
Pt transferred to TCU awaiting a bed assignment for Community Acquired PNA. Pt alert and oriented x 4, neuro intact. Talking with mother with no signs of respiratory difficulty. Bilateral breath sounds diminished. Pt on 2L O2 via nasal cannula saturating 98%. Pt is eating her dinner tray at this time.

## 2011-04-07 LAB — BASIC METABOLIC PANEL
BUN: 6 mg/dL (ref 6–23)
Calcium: 9 mg/dL (ref 8.4–10.5)
Creatinine, Ser: 0.74 mg/dL (ref 0.50–1.10)
GFR calc non Af Amer: >90 mL/min (ref >90)
Glucose, Bld: 80 mg/dL (ref 70–99)
Potassium: 4.3 mEq/L (ref 3.5–5.1)

## 2011-04-07 LAB — CBC
Hemoglobin: 10.2 g/dL — ABNORMAL LOW (ref 12.0–15.0)
MCH: 28.1 pg (ref 26.0–34.0)
MCHC: 32.7 g/dL (ref 30.0–36.0)
RDW: 15.3 % (ref 11.5–15.5)

## 2011-04-07 LAB — TSH: TSH: 1.947 u[IU]/mL (ref 0.350–4.500)

## 2011-04-07 LAB — HEMOGLOBIN A1C: Hgb A1c MFr Bld: 5.6 % (ref <5.7)

## 2011-04-07 LAB — INFLUENZA PANEL BY PCR (TYPE A & B): Influenza B By PCR: NEGATIVE

## 2011-04-07 MED ORDER — ALBUTEROL SULFATE (5 MG/ML) 0.5% IN NEBU
INHALATION_SOLUTION | RESPIRATORY_TRACT | Status: AC
  Filled 2011-04-07: qty 0.5

## 2011-04-07 MED ORDER — ALBUTEROL SULFATE (5 MG/ML) 0.5% IN NEBU
INHALATION_SOLUTION | RESPIRATORY_TRACT | Status: AC
  Administered 2011-04-07: 2.5 mg
  Filled 2011-04-07: qty 0.5

## 2011-04-07 MED ORDER — ALBUTEROL SULFATE (5 MG/ML) 0.5% IN NEBU
2.5000 mg | INHALATION_SOLUTION | Freq: Four times a day (QID) | RESPIRATORY_TRACT | Status: DC
  Administered 2011-04-07 – 2011-04-09 (×12): 2.5 mg via RESPIRATORY_TRACT
  Filled 2011-04-07 (×8): qty 0.5

## 2011-04-07 MED ORDER — METHADONE HCL 5 MG/5ML PO SOLN: 110.0000 mg | Freq: Every day | ORAL | Status: DC

## 2011-04-07 MED ORDER — ALBUTEROL SULFATE (5 MG/ML) 0.5% IN NEBU: 2.5000 mg | INHALATION_SOLUTION | RESPIRATORY_TRACT | Status: DC | PRN

## 2011-04-07 MED ORDER — ALBUTEROL SULFATE (5 MG/ML) 0.5% IN NEBU: 2.5000 mg | INHALATION_SOLUTION | RESPIRATORY_TRACT | Status: DC

## 2011-04-07 MED ORDER — LORAZEPAM 2 MG/ML IJ SOLN
1.0000 mg | Freq: Three times a day (TID) | INTRAMUSCULAR | Status: DC | PRN
  Administered 2011-04-08: 1 mg via INTRAVENOUS
  Filled 2011-04-07: qty 1

## 2011-04-07 MED ORDER — BUTALBITAL-APAP-CAFFEINE 50-325-40 MG PO TABS
1.0000 | ORAL_TABLET | Freq: Three times a day (TID) | ORAL | Status: DC | PRN
  Administered 2011-04-07 – 2011-04-10 (×7): 2 via ORAL
  Filled 2011-04-07 (×7): qty 2

## 2011-04-07 MED ORDER — METHADONE HCL 10 MG/ML PO CONC
110.0000 mg | Freq: Every day | ORAL | Status: DC
  Administered 2011-04-07 – 2011-04-10 (×4): 110 mg via ORAL
  Filled 2011-04-07 (×2): qty 11
  Filled 2011-04-07: qty 10
  Filled 2011-04-07: qty 1
  Filled 2011-04-07 (×2): qty 11

## 2011-04-07 MED ORDER — IPRATROPIUM BROMIDE 0.02 % IN SOLN
0.5000 mg | Freq: Four times a day (QID) | RESPIRATORY_TRACT | Status: DC
  Administered 2011-04-07 – 2011-04-09 (×11): 0.5 mg via RESPIRATORY_TRACT
  Filled 2011-04-07 (×8): qty 2.5

## 2011-04-07 NOTE — Progress Notes (Signed)
Instructed patient on purse lip breathing and relaxtion techniques. RR decreased from 32 to 18 . Changed patient from a non rebreather to [place] 6lpm with O2 sats 94-95%. Will continue to monitor

## 2011-04-07 NOTE — Progress Notes (Signed)
UR CHART REVIEWED; B Zurich Carreno RN, BSN, MHA 

## 2011-04-07 NOTE — Progress Notes (Signed)
Patient ID: Mary Ewing, female   DOB: May 20, 1970, 40 y.o.   MRN: [id number]  Subjective: No events overnight. Patient denies chest pain, shortness of breath, abdominal pain. Had bowel movement and reports ambulating.  Objective:  Vital signs in last 24 hours:  Filed Vitals:   04/07/11 0540 04/07/11 0905 04/07/11 1455 04/07/11 1519  BP: 109/67  112/71   Pulse: 89  82   Temp: 99.9 F (37.7 C)  99 F (37.2 C)   TempSrc:   Oral   Resp: 36  24   Height:      Weight:      SpO2: 94% 95% 96% 94%    Intake/Output from previous day:   Intake/Output Summary (Last 24 hours) at 04/07/11 1738 Last data filed at 04/07/11 1300  Gross per 24 hour  Intake   1402 ml  Output   2400 ml  Net   -998 ml    Physical Exam: General: Alert, awake, oriented x3, in no acute distress. HEENT: No bruits, no goiter. Moist mucous membranes, no scleral icterus, no conjunctival pallor. Heart: Regular rate and rhythm, without murmurs, rubs, gallops. Lungs: Clear to auscultation bilaterally with minimal bibasilar crackles. No wheezing, no rhonchi, no rales.  Abdomen: Soft, nontender, nondistended, positive bowel sounds. Extremities: No clubbing cyanosis or edema,  positive pedal pulses. Neuro: Grossly intact, nonfocal.  Lab Results:  Basic Metabolic Panel:    Component Value Date/Time   NA 135 04/07/2011 0630   K 4.3 04/07/2011 0630   CL 103 04/07/2011 0630   CO2 25 04/07/2011 0630   BUN 6 04/07/2011 0630   CREATININE 0.74 04/07/2011 0630   GLUCOSE 80 04/07/2011 0630   CALCIUM 9.0 04/07/2011 0630   CBC:    Component Value Date/Time   WBC 10.1 04/07/2011 0630   HGB 10.2* 04/07/2011 0630   HCT 31.2* 04/07/2011 0630   PLT 283 04/07/2011 0630   MCV 86.0 04/07/2011 0630   NEUTROABS 10.0* 04/28/11 1338   LYMPHSABS 2.5 04/28/2011 1338   MONOABS 1.4* 28-Apr-2011 1338   EOSABS 0.1 Apr 28, 2011 1338   BASOSABS 0.0 04/28/2011 1338      Lab 04/07/11 0630 04-28-11 1801 04-28-2011 1338  WBC 10.1  11.3* 14.0*  HGB 10.2* 9.9* 10.5*  HCT 31.2* 30.9* 31.9*  PLT 283 264 278  MCV 86.0 85.4 84.6  MCH 28.1 27.3 27.9  MCHC 32.7 32.0 32.9  RDW 15.3 15.2 15.1  LYMPHSABS -- -- 2.5  MONOABS -- -- 1.4*  EOSABS -- -- 0.1  BASOSABS -- -- 0.0  BANDABS -- -- --    Lab 04/07/11 0630 04-28-2011 1801 2011/04/28 1338  NA 135 -- 137  K 4.3 -- 3.3*  CL 103 -- 101  CO2 25 -- 25  GLUCOSE 80 -- 95  BUN 6 -- 3*  CREATININE 0.74 0.72 0.74  CALCIUM 9.0 -- 10.0  MG -- 2.0 --    Studies/Results: Dg Chest 2 View Apr 28, 2011   IMPRESSION:  Bilateral perihilar and lower lobes patchy airspace disease left greater than right suspicious for bilateral pneumonia or less likely pulmonary edema.   Medications: Scheduled Meds:   . albuterol  2.5 mg Nebulization QID  . albuterol      . albuterol      . enoxaparin  40 mg Subcutaneous Q24H  . FLUoxetine  60 mg Oral Daily  . gabapentin  600 mg Oral QID  . ipratropium  0.5 mg Nebulization QID  . methadone  110 mg Oral Daily  . moxifloxacin  400 mg Intravenous Q24H  . nicotine  21 mg Transdermal Daily  . pantoprazole  40 mg Oral Q1200  . senna  1 tablet Oral BID  . DISCONTD: albuterol  2.5 mg Nebulization Q4H  . DISCONTD: methadone  110 mg Oral Daily  . DISCONTD: methadone  120 mg Oral Daily   Continuous Infusions:   . sodium chloride 50 mL/hr at 04/07/11 1644   PRN Meds:.acetaminophen, albuterol, butalbital-acetaminophen-caffeine, influenza  inactive virus vaccine, LORazepam, meclizine, ondansetron (ZOFRAN) IV, ondansetron, zolpidem, DISCONTD: pneumococcal 23 valent vaccine  Assessment/Plan:  Principal Problem:  *PNA (pneumonia) - pt clinically improving - continue supportive care with IVF, antibiotic, and nebulizers scheduled and PRN - monitor vitals per floor protocol  Active Problems:  Nausea - now improving - continue Zofran PRN   HYPERTENSION NEC - well controlled   Disposition - plan of care and diagnosis, diagnostic studies and  test results were discussed with pt and family at bedside - pt and  family verbalized understanding    LOS: 1 day   MAGICK-Philomene Haff 04/07/2011, 5:38 PM

## 2011-04-07 NOTE — Progress Notes (Signed)
Patient was attempting to get up and use the [facility] when sats dropped to mid to low 50s. Rapid response nurse was called and on call MD was paged. NB mask applied on 15L and sats begin to increase to the Mid 90s. On call MD ordered breathing treatments q4hr prn. Patient stable and relaxed after treatment. Patient told to call from now on when getting up for any reason. Will continue to monitor patient.

## 2011-04-08 LAB — CBC
MCV: 85 fL (ref 78.0–100.0)
Platelets: 361 10*3/uL (ref 150–400)
RDW: 15.3 % (ref 11.5–15.5)
WBC: 7.3 10*3/uL (ref 4.0–10.5)

## 2011-04-08 LAB — BASIC METABOLIC PANEL
Calcium: 9.7 mg/dL (ref 8.4–10.5)
Chloride: 100 mEq/L (ref 96–112)
Creatinine, Ser: 0.67 mg/dL (ref 0.50–1.10)
GFR calc Af Amer: >90 mL/min (ref >90)
Sodium: 138 mEq/L (ref 135–145)

## 2011-04-08 MED ORDER — MOXIFLOXACIN HCL 400 MG PO TABS
400.0000 mg | ORAL_TABLET | Freq: Every day | ORAL | Status: DC
  Administered 2011-04-08 – 2011-04-09 (×2): 400 mg via ORAL
  Filled 2011-04-08 (×4): qty 1

## 2011-04-08 NOTE — Progress Notes (Signed)
Subjective: Pt mentions that she feels noticeably better.  Still having some SOB.  Hasn't tried to ambulate much today.  Denies any fever, chills, hemoptysis.  Objective: Filed Vitals:   04/07/11 2226 04/08/11 0417 04/08/11 0523 04/08/11 1445  BP: 114/70  112/71 106/61  Pulse: 82  76 78  Temp: 98.8 F (37.1 C)  99.1 F (37.3 C) 98.4 F (36.9 C)  TempSrc: Oral  Oral Oral  Resp: 20  20 18   Height:      Weight:      SpO2: 95% 71% 96% 96%   Weight change:   Intake/Output Summary (Last 24 hours) at 04/08/11 1716 Last data filed at 04/08/11 1500  Gross per 24 hour  Intake   2560 ml  Output   5050 ml  Net  -2490 ml    General: Alert, awake, oriented x3, in no acute distress.  HEENT: No bruits, no goiter.  Heart: Regular rate and rhythm, without murmurs, rubs, gallops.  Lungs: Rhales more at bases than at apices. No wheezes  Abdomen: Soft, nontender, nondistended, positive bowel sounds.  Neuro: Grossly intact, nonfocal.   Lab Results:  Basename 04/08/11 0415 04/07/11 0630 04/06/11 1801  NA 138 135 --  K 3.8 4.3 --  CL 100 103 --  CO2 30 25 --  GLUCOSE 91 80 --  BUN 4* 6 --  CREATININE 0.67 0.74 --  CALCIUM 9.7 9.0 --  MG -- -- 2.0  PHOS -- -- 4.0   No results found for this basename: AST:2,ALT:2,ALKPHOS:2,BILITOT:2,PROT:2,ALBUMIN:2 in the last 72 hours No results found for this basename: LIPASE:2,AMYLASE:2 in the last 72 hours  Basename 04/08/11 0415 04/07/11 0630 04/06/11 1338  WBC 7.3 10.1 --  NEUTROABS -- -- 10.0*  HGB 10.5* 10.2* --  HCT 32.8* 31.2* --  MCV 85.0 86.0 --  PLT 361 283 --   No results found for this basename: CKTOTAL:3,CKMB:3,CKMBINDEX:3,TROPONINI:3 in the last 72 hours No components found with this basename: POCBNP:3 No results found for this basename: DDIMER:2 in the last 72 hours  Basename 04/06/11 1801  HGBA1C 5.6   No results found for this basename: CHOL:2,HDL:2,LDLCALC:2,TRIG:2,CHOLHDL:2,LDLDIRECT:2 in the last 72 hours  Basename  04/06/11 1801  TSH 1.947  T4TOTAL --  T3FREE --  THYROIDAB --   No results found for this basename: VITAMINB12:2,FOLATE:2,FERRITIN:2,TIBC:2,IRON:2,RETICCTPCT:2 in the last 72 hours  Micro Results: No results found for this or any previous visit (from the past 240 hour(s)).  Studies/Results: No results found.  Medications: I have reviewed the patient's current medications.  Principal Problem:  *PNA (pneumonia)  - pt clinically improving, afebrile and Wbc within normal limits.  Will recheck CBC for tomorrow. - continue supportive care, patient will start oral antibiotic today, and nebulizers scheduled and PRN  - monitor vitals per floor protocol    Nausea  Improving, no complaints of nausea today. Zofran PRN  HYPERTENSION NEC  - well controlled last BP 106/61  Reportedly patient is on methadone: in the chart it states that pt goes to [facility] center.  Will f/u to confirm.     Disposition:  If we can wean off of supplemental O2 will plan on discharging.   LOS: 2 days   Penny Pia M.D.  [facility] 04/08/2011, 5:16 PM

## 2011-04-08 NOTE — Progress Notes (Signed)
Attempted to wean fio2 as requested by MD. O2 sats decreased to 87%. Neb treatment given and pt's sats remained on 93% 15 minutes after neb treatment was finished. Pt was placed on 2 liter [place] due to sats continuing to decrease when she talked. Unable to wean O2 off completly at this time.

## 2011-04-08 NOTE — Progress Notes (Signed)
Pt called Rn to the room and c/o that her oxygen was not right. RN went immediately into room and pt was sitting in the bed with the phone in her hand. She was not wearing oxygen and the nebulizer mask was on her bed with the oxygen connected to that. The Resp. Therapist had been in the room almost an hour before when RN was in the room and was setting th pt up for a neb tx. Pt apparently did not get changed back by the RT. RN placed pt back on )2 at 3lpm and pt did not appear in distress. Will continue to monitor. Sheran Luz RN BSN.

## 2011-04-08 NOTE — Progress Notes (Signed)
PHARMACIST - PHYSICIAN COMMUNICATION CONCERNING: Antibiotic IV to Oral Route Change Policy  RECOMMENDATION: This patient is receiving moxifloxacin by the intravenous route.  Based on criteria approved by the Pharmacy and Therapeutics Committee, the antibiotic(s) is/are being converted to the equivalent oral dose form(s).   DESCRIPTION: These criteria include:  Patient being treated for a respiratory tract infection, urinary tract infection, or cellulitis  The patient is not neutropenic and does not exhibit a GI malabsorption state  The patient is eating (either orally or via tube) and/or has been taking other orally administered medications for a least 24 hours  The patient is improving clinically and has a Tmax < 100.5  If you have questions about this conversion, please contact the Pharmacy Department  []   [phone] )  [facility] []   [phone] )  [facility]  []   [phone] )  [facility] [x]   [phone] )  [facility]

## 2011-04-09 LAB — CBC
HCT: 34.1 % — ABNORMAL LOW (ref 36.0–46.0)
Platelets: 385 10*3/uL (ref 150–400)
RDW: 15.2 % (ref 11.5–15.5)
WBC: 5.9 10*3/uL (ref 4.0–10.5)

## 2011-04-09 MED ORDER — LORAZEPAM 1 MG PO TABS
1.0000 mg | ORAL_TABLET | Freq: Three times a day (TID) | ORAL | Status: DC | PRN
  Administered 2011-04-09: 1 mg via ORAL
  Filled 2011-04-09: qty 1

## 2011-04-09 MED ORDER — GABAPENTIN 300 MG PO CAPS
600.0000 mg | ORAL_CAPSULE | Freq: Four times a day (QID) | ORAL | Status: DC
  Administered 2011-04-09 – 2011-04-10 (×2): 600 mg via ORAL
  Filled 2011-04-09 (×4): qty 2

## 2011-04-09 NOTE — Progress Notes (Signed)
Subjective: Pt reports that she has tried to ambulate more today.  Nursing reports that patient is reluctant to ambulate.  When I walked into the room patient was off of her supplemental oxygen and stated she wasn't short of breath.  Mentions that when she ambulated today her SO2 went down below 87 percent. Objective: Filed Vitals:   04/08/11 2140 04/08/11 2207 04/09/11 0525 04/09/11 1435  BP: 115/73  121/75 95/60  Pulse: 87  72 72  Temp: 98.8 F (37.1 C)  98.8 F (37.1 C) 98.3 F (36.8 C)  TempSrc: Oral  Oral Oral  Resp: 20  20 20   Height:      Weight:      SpO2: 95% 96% 98% 96%   Weight change:   Intake/Output Summary (Last 24 hours) at 04/09/11 1740 Last data filed at 04/09/11 1700  Gross per 24 hour  Intake   3510 ml  Output   2700 ml  Net    810 ml    General: Alert, awake, oriented x3, in no acute distress.  HEENT: No bruits, no goiter.  Heart: Regular rate and rhythm, without murmurs, rubs, gallops.  Lungs: Rhales mild bases> apices Abdomen: Soft, nontender, nondistended, positive bowel sounds.  Neuro: Grossly intact, nonfocal.   Lab Results:  Basename 04/08/11 0415 04/07/11 0630 04/06/11 1801  NA 138 135 --  K 3.8 4.3 --  CL 100 103 --  CO2 30 25 --  GLUCOSE 91 80 --  BUN 4* 6 --  CREATININE 0.67 0.74 --  CALCIUM 9.7 9.0 --  MG -- -- 2.0  PHOS -- -- 4.0   No results found for this basename: AST:2,ALT:2,ALKPHOS:2,BILITOT:2,PROT:2,ALBUMIN:2 in the last 72 hours No results found for this basename: LIPASE:2,AMYLASE:2 in the last 72 hours  Basename 04/09/11 0513 04/08/11 0415  WBC 5.9 7.3  NEUTROABS -- --  HGB 10.9* 10.5*  HCT 34.1* 32.8*  MCV 85.3 85.0  PLT 385 361   No results found for this basename: CKTOTAL:3,CKMB:3,CKMBINDEX:3,TROPONINI:3 in the last 72 hours No components found with this basename: POCBNP:3 No results found for this basename: DDIMER:2 in the last 72 hours  Basename 04/06/11 1801  HGBA1C 5.6   No results found for this  basename: CHOL:2,HDL:2,LDLCALC:2,TRIG:2,CHOLHDL:2,LDLDIRECT:2 in the last 72 hours  Basename 04/06/11 1801  TSH 1.947  T4TOTAL --  T3FREE --  THYROIDAB --   No results found for this basename: VITAMINB12:2,FOLATE:2,FERRITIN:2,TIBC:2,IRON:2,RETICCTPCT:2 in the last 72 hours  Micro Results: No results found for this or any previous visit (from the past 240 hour(s)).  Studies/Results: No results found.  Medications: I have reviewed the patient's current medications.   Patient Active Hospital Problem List: PNA (pneumonia) (04/06/2011) -Pt it to ambulate more -use incentive spirometry -continue nebs and abx's  Nausea alone (05/19/2009) Resolved, hasn't asked for her prn antiemetic  HYPERTENSION NEC (11/24/2008)   Well controlled currently  Should patient improve and not be hypoxic on room air will plan on D/Cing home.   LOS: 3 days   Penny Pia M.D.  [facility] 04/09/2011, 5:40 PM

## 2011-04-09 NOTE — Progress Notes (Signed)
Discussed with patient the importance for her to get out of bed, either to walk or get up in a chair. Pt doesn't want to do either and states she is too short of breath. Pt's oxygenation status is getting worse even though pt has repeatedly been seen taking her oxygen off when she was told not too. Pt is unable to wean O2 off with sats in the 80's and is reluctant to do anything that may improve her respiratory status.

## 2011-04-10 MED ORDER — MOXIFLOXACIN HCL 400 MG PO TABS: 400.0000 mg | ORAL_TABLET | Freq: Every day | ORAL | Status: DC

## 2011-04-10 MED ORDER — MOXIFLOXACIN HCL 400 MG PO TABS: 400.0000 mg | ORAL_TABLET | Freq: Every day | ORAL | Status: AC

## 2011-04-10 MED ORDER — ALBUTEROL 90 MCG/ACT IN AERS: 2.0000 | INHALATION_SPRAY | Freq: Four times a day (QID) | RESPIRATORY_TRACT | Status: DC | PRN

## 2011-04-10 MED ORDER — INFLUENZA VIRUS VACC SPLIT PF IM SUSP
0.5000 mL | INTRAMUSCULAR | Status: AC | PRN
  Administered 2011-04-10: 0.5 mL via INTRAMUSCULAR
  Filled 2011-04-10: qty 0.5

## 2011-04-10 MED ORDER — BUTALBITAL-APAP-CAFFEINE 50-325-40 MG PO TABS: 1.0000 | ORAL_TABLET | Freq: Four times a day (QID) | ORAL | Status: AC | PRN

## 2011-04-10 NOTE — Progress Notes (Signed)
[id number]/medication assistance obtained for ventolin inhaler and po abx.  Duplicate scribts given to patient.

## 2011-04-10 NOTE — Progress Notes (Signed)
Subjective: Pt mentions that she slept well.  Denies any shortness of breath, fever, chills, cough, nausea, or abd. Discomfort.  No acute issues overnight.  States that she feels better and is requesting to go home.  Pt was complaining of headaches. Objective: Filed Vitals:   04/09/11 2153 04/09/11 2210 04/10/11 0242 04/10/11 0625  BP:    128/84  Pulse:   74 74  Temp:    98.7 F (37.1 C)  TempSrc:    Oral  Resp:    20  Height:      Weight:      SpO2: 91% 95% 96% 95%   Weight change:   Intake/Output Summary (Last 24 hours) at 04/10/11 0849 Last data filed at 04/10/11 1610  Gross per 24 hour  Intake   2040 ml  Output   2400 ml  Net   -360 ml    General: Alert, awake, oriented x3, in no acute distress.  HEENT: No bruits, no goiter.  Heart: Regular rate and rhythm, without murmurs, rubs, gallops.  Lungs: Rhales mild bases> apices.  No wheeze Abdomen: Soft, nontender, nondistended, positive bowel sounds.  Neuro: Grossly intact, nonfocal.   Lab Results:  [facility] 04/08/11 0415  NA 138  K 3.8  CL 100  CO2 30  GLUCOSE 91  BUN 4*  CREATININE 0.67  CALCIUM 9.7  MG --  PHOS --   No results found for this basename: AST:2,ALT:2,ALKPHOS:2,BILITOT:2,PROT:2,ALBUMIN:2 in the last 72 hours No results found for this basename: LIPASE:2,AMYLASE:2 in the last 72 hours  Basename 04/09/11 0513 04/08/11 0415  WBC 5.9 7.3  NEUTROABS -- --  HGB 10.9* 10.5*  HCT 34.1* 32.8*  MCV 85.3 85.0  PLT 385 361   No results found for this basename: CKTOTAL:3,CKMB:3,CKMBINDEX:3,TROPONINI:3 in the last 72 hours No components found with this basename: POCBNP:3 No results found for this basename: DDIMER:2 in the last 72 hours No results found for this basename: HGBA1C:2 in the last 72 hours No results found for this basename: CHOL:2,HDL:2,LDLCALC:2,TRIG:2,CHOLHDL:2,LDLDIRECT:2 in the last 72 hours No results found for this basename: TSH,T4TOTAL,FREET3,T3FREE,THYROIDAB in the last 72 hours No  results found for this basename: VITAMINB12:2,FOLATE:2,FERRITIN:2,TIBC:2,IRON:2,RETICCTPCT:2 in the last 72 hours  Micro Results: No results found for this or any previous visit (from the past 240 hour(s)).  Studies/Results: No results found.  Medications: I have reviewed the patient's current medications.   Patient Active Hospital Problem List: PNA (pneumonia) (04/06/2011)   Resolving.  Pt's hypoxia has resolved as well.  Was satting 97% during examination.  Will plan on d/cing home today on abx and ventolin.  Nausea alone (05/19/2009)  Resolved.  HYPERTENSION NEC (11/24/2008)   Pt has been normotensive  Headaches:  Pt on Fioricet and this has provided relief     LOS: 4 days   Penny Pia M.D.  [facility] 04/10/2011, 8:49 AM

## 2011-04-10 NOTE — Discharge Summary (Signed)
Admit date: 04/06/2011 Discharge date: 04/10/2011  Primary Care Physician:  Evette Georges, MD   Discharge Diagnoses:   No resolved problems to display.  Active Hospital Problems  Diagnoses Date Noted   . PNA (pneumonia) 04/06/2011   . Nausea alone 05/19/2009   . HYPERTENSION NEC 11/24/2008     Resolved Hospital Problems  Diagnoses Date Noted Date Resolved     DISCHARGE MEDICATION: Current Discharge Medication List    START taking these medications   Details  butalbital-acetaminophen-caffeine (FIORICET, ESGIC) 50-325-40 MG per tablet Take 1-2 tablets by mouth every 6 (six) hours as needed for headache. Qty: 10 tablet, Refills: 0    moxifloxacin (AVELOX) 400 MG tablet Take 1 tablet (400 mg total) by mouth daily at 6 PM. Qty: 6 tablet, Refills: 0      CONTINUE these medications which have NOT CHANGED   Details  FLUoxetine (PROZAC) 20 MG tablet Take 60 mg by mouth daily.      gabapentin (NEURONTIN) 600 MG tablet Take 600 mg by mouth 4 (four) times daily.      meclizine (ANTIVERT) 25 MG tablet Take 25 mg by mouth 3 (three) times daily as needed. DIZZINESS     METHADONE HCL PO Take 110 mg by mouth daily. PATIENT GOES TO [facility] FOR METHADONE     Multiple Vitamins-Minerals (MULTIVITAMINS THER. W/MINERALS) TABS Take 1 tablet by mouth daily.      omeprazole (PRILOSEC) 20 MG capsule Take 20 mg by mouth daily.      vitamin B-12 (CYANOCOBALAMIN) 1000 MCG tablet Take 1,000 mcg by mouth daily.      zolpidem (AMBIEN) 10 MG tablet Take 10 mg by mouth at bedtime as needed. FOR SLEEP       STOP taking these medications     levofloxacin (LEVAQUIN) 250 MG tablet            Consults:     SIGNIFICANT DIAGNOSTIC STUDIES:  Dg Chest 2 View  04/06/2011  *RADIOLOGY REPORT*  Clinical Data: Fever, chest pain, shortness of breath  CHEST - 2 VIEW  Comparison: 04/16/2009  Findings: Cardiomediastinal silhouette is stable.  Bilateral perihilar and lower  lobes patchy airspace disease left greater than right suspicious for bilateral pneumonia or less likely pulmonary edema. Mild degenerative changes thoracic spine.  IMPRESSION:  Bilateral perihilar and lower lobes patchy airspace disease left greater than right suspicious for bilateral pneumonia or less likely pulmonary edema.  Original Report Authenticated By: Natasha Mead, M.D.     ECHO:N/A     CARDIAC CATH & OTHER PROCEDURES:None  No results found for this or any previous visit (from the past 240 hour(s)).  BRIEF ADMITTING H & P:  Pt is a 40 y/o that was admitted secondary to progressive SOB/Dyspnea.  Was evaluated and found to have a chest xray which was suspicious for bilateral pneumonia vs pulmonary edema (reported as less likely).  While at the hospital patient was treated with duo nebs, supplemental oxygen, and antibiotics.  On this regimen patient condition improved.  Was c/o headaches which responded to fioricept.  Pt will be d/c'd on abx and ventolin.  No resolved problems to display.  Active Hospital Problems  Diagnoses Date Noted   . PNA (pneumonia) 04/06/2011   . Nausea alone 05/19/2009   . HYPERTENSION NEC 11/24/2008     Resolved Hospital Problems  Diagnoses Date Noted Date Resolved     Disposition and Follow-up: Pt is to take medication as indicated.  Is to f/u with  primary care physician in 1-2 weeks.  The only antibiotic which patient is to take is the Avelox.  Pt is aware and agreeable to plan  Discharge Orders    Future Orders Please Complete By Expires   Diet - low sodium heart healthy      Increase activity slowly      Discharge instructions      Comments:   Take all medication as indicated.  Also recommend following up with a primary care physician in 1-2 weeks.   Driving Restrictions      Comments:   Pt is not to drive if taking opiod medication.  Please follow all instructions and read all warnings related to medication   Call MD for:  temperature >100.4       Call MD for:  difficulty breathing, headache or visual disturbances        Follow-up Information    Follow up with TODD,JEFFREY ALLEN .          DISCHARGE EXAM:  Constitutional: Vital signs reviewed. Patient is a well-developed and well-nourished in no acute distress and cooperative with exam. Alert and oriented x3.  Head: Normocephalic and atraumatic  Ear: TM normal bilaterally  Mouth: no erythema or exudates, MMM  Eyes: PERRL, EOMI, conjunctivae normal, No scleral icterus.  Neck: Supple, Trachea midline normal ROM, No JVD, mass, thyromegaly, or carotid bruit present.  Cardiovascular: RRR, HR is between 80-110, S1 normal, S2 normal, no MRG, pulses symmetric and intact bilaterally  Pulmonary/Chest: CTAB, no wheezes, minimal bibasilar crackles.  Abdominal: Soft. Non-tender, non-distended, bowel sounds are normal, no masses, organomegaly, or guarding present.  GU: no CVA tenderness Musculoskeletal: No joint deformities, erythema, or stiffness, ROM full and no nontender Ext: no edema and no cyanosis, pulses palpable bilaterally (DP and PT)  Hematology: no cervical, inginal, or axillary adenopathy.  Neurological: A&O x3, Strenght is normal and symmetric bilaterally, cranial nerve II-XII are grossly intact, no focal motor deficit, sensory intact to light touch bilaterally.  Skin: Warm, dry and intact. No rash, cyanosis, or clubbing.  Psychiatric: Normal mood and affect. speech and behavior is normal. Judgment and thought content normal. Cognition and memory are normal.    Blood pressure 128/84, pulse 74, temperature 98.7 F (37.1 C), temperature source Oral, resp. rate 20, height 5\' 2"  (1.575 m), weight 78.019 kg (172 lb), SpO2 95.00%.   Basename 04/08/11 0415  NA 138  K 3.8  CL 100  CO2 30  GLUCOSE 91  BUN 4*  CREATININE 0.67  CALCIUM 9.7  MG --  PHOS --   No results found for this basename: AST:2,ALT:2,ALKPHOS:2,BILITOT:2,PROT:2,ALBUMIN:2 in the last 72 hours No results  found for this basename: LIPASE:2,AMYLASE:2 in the last 72 hours  Basename 04/09/11 0513 04/08/11 0415  WBC 5.9 7.3  NEUTROABS -- --  HGB 10.9* 10.5*  HCT 34.1* 32.8*  MCV 85.3 85.0  PLT 385 361    Signed: Penny Pia M.D. 04/10/2011, 9:18 AM

## 2011-04-10 NOTE — Progress Notes (Signed)
Pt for d/c home today. IV d/c'd. Dressing applied. Instructed pt to do regular use of IS; C & DB as well. D/C instructions an d Rx's with verbalized understanding. CM consulted d/t ABT po for home. Pts mother will pick pt up for d/c.

## 2011-09-20 ENCOUNTER — Emergency Department (HOSPITAL_COMMUNITY)
Admission: EM | Admit: 2011-09-20 | Discharge: 2011-09-20 | Disposition: A | Discharge status: Home / Self Care | Payer: Self-pay | Attending: Emergency Medicine | Admitting: Emergency Medicine

## 2011-09-20 ENCOUNTER — Emergency Department (HOSPITAL_COMMUNITY): Payer: Self-pay

## 2011-09-20 ENCOUNTER — Encounter (HOSPITAL_COMMUNITY): Payer: Self-pay | Admitting: Family Medicine

## 2011-09-20 DIAGNOSIS — K219 Gastro-esophageal reflux disease without esophagitis: Secondary | ICD-10-CM | POA: Insufficient documentation

## 2011-09-20 DIAGNOSIS — Z79899 Other long term (current) drug therapy: Secondary | ICD-10-CM | POA: Insufficient documentation

## 2011-09-20 DIAGNOSIS — J189 Pneumonia, unspecified organism: Secondary | ICD-10-CM | POA: Insufficient documentation

## 2011-09-20 DIAGNOSIS — R0602 Shortness of breath: Secondary | ICD-10-CM | POA: Insufficient documentation

## 2011-09-20 DIAGNOSIS — F341 Dysthymic disorder: Secondary | ICD-10-CM | POA: Insufficient documentation

## 2011-09-20 LAB — URINALYSIS, ROUTINE W REFLEX MICROSCOPIC
Bilirubin Urine: NEGATIVE
Glucose, UA: NEGATIVE mg/dL
Hgb urine dipstick: NEGATIVE
Ketones, ur: NEGATIVE mg/dL
Leukocytes, UA: NEGATIVE
Nitrite: NEGATIVE
Protein, ur: NEGATIVE mg/dL
Specific Gravity, Urine: 1.016 (ref 1.005–1.030)
Urobilinogen, UA: 0.2 mg/dL (ref 0.0–1.0)
pH: 6 (ref 5.0–8.0)

## 2011-09-20 LAB — CBC
HCT: 42.9 % (ref 36.0–46.0)
Hemoglobin: 14 g/dL (ref 12.0–15.0)
MCH: 28.5 pg (ref 26.0–34.0)
MCHC: 32.6 g/dL (ref 30.0–36.0)
MCV: 87.2 fL (ref 78.0–100.0)
Platelets: 326 10*3/uL (ref 150–400)
RBC: 4.92 MIL/uL (ref 3.87–5.11)
RDW: 13.6 % (ref 11.5–15.5)
WBC: 11.9 10*3/uL — ABNORMAL HIGH (ref 4.0–10.5)

## 2011-09-20 LAB — PREGNANCY, URINE: Preg Test, Ur: NEGATIVE

## 2011-09-20 LAB — BASIC METABOLIC PANEL
BUN: 5 mg/dL — ABNORMAL LOW (ref 6–23)
CO2: 26 mEq/L (ref 19–32)
Calcium: 9.5 mg/dL (ref 8.4–10.5)
Chloride: 98 mEq/L (ref 96–112)
Creatinine, Ser: 0.74 mg/dL (ref 0.50–1.10)
GFR calc Af Amer: >90 mL/min (ref >90)
GFR calc non Af Amer: >90 mL/min (ref >90)
Glucose, Bld: 91 mg/dL (ref 70–99)
Potassium: 3.6 mEq/L (ref 3.5–5.1)
Sodium: 136 mEq/L (ref 135–145)

## 2011-09-20 MED ORDER — AZITHROMYCIN 250 MG PO TABS: ORAL_TABLET | ORAL | Status: DC

## 2011-09-20 MED ORDER — ALBUTEROL SULFATE HFA 108 (90 BASE) MCG/ACT IN AERS
2.0000 | INHALATION_SPRAY | Freq: Once | RESPIRATORY_TRACT | Status: AC
  Administered 2011-09-20: 2 via RESPIRATORY_TRACT
  Filled 2011-09-20: qty 6.7

## 2011-09-20 MED ORDER — IBUPROFEN 200 MG PO TABS
400.0000 mg | ORAL_TABLET | Freq: Once | ORAL | Status: AC
  Administered 2011-09-20: 400 mg via ORAL
  Filled 2011-09-20: qty 2

## 2011-09-20 MED ORDER — SODIUM CHLORIDE 0.9 % IV BOLUS (SEPSIS)
1000.0000 mL | Freq: Once | INTRAVENOUS | Status: AC
  Administered 2011-09-20: 1000 mL via INTRAVENOUS

## 2011-09-20 MED ORDER — GI COCKTAIL ~~LOC~~
30.0000 mL | Freq: Once | ORAL | Status: AC
  Administered 2011-09-20: 30 mL via ORAL
  Filled 2011-09-20: qty 30

## 2011-09-20 MED ORDER — DIAZEPAM 5 MG/ML IJ SOLN
5.0000 mg | Freq: Once | INTRAMUSCULAR | Status: AC
  Administered 2011-09-20: 5 mg via INTRAVENOUS
  Filled 2011-09-20: qty 2

## 2011-09-20 MED ORDER — DIAZEPAM 5 MG PO TABS: 5.0000 mg | ORAL_TABLET | Freq: Two times a day (BID) | ORAL | Status: AC

## 2011-09-20 MED ORDER — DEXTROSE 5 % IV SOLN
500.0000 mg | Freq: Once | INTRAVENOUS | Status: AC
  Administered 2011-09-20: 500 mg via INTRAVENOUS
  Filled 2011-09-20: qty 500

## 2011-09-20 MED ORDER — DEXTROSE 5 % IV SOLN
1.0000 g | Freq: Once | INTRAVENOUS | Status: AC
  Administered 2011-09-20: 1 g via INTRAVENOUS
  Filled 2011-09-20: qty 10

## 2011-09-20 NOTE — ED Notes (Signed)
Pt not ready for discharge. Antibiotics infusing.

## 2011-09-20 NOTE — ED Notes (Signed)
Pt reports feeling "sick" for approx 3 months. States when it got really bad she took a rx of avelox she already had and states she started feeling better. States yesterday she began feeling worse again having sob, fever, jaw pain. States she felt similarly when she had pna in December.

## 2011-09-20 NOTE — Discharge Instructions (Signed)

## 2011-09-20 NOTE — ED Notes (Signed)
D/c IV, pt is being d/c

## 2011-09-27 NOTE — ED Provider Notes (Signed)
History    40yf with cough. Pt reports being sick with intermittent cough and congestion for over past 3-4 months. At one point had course of avelox and symptoms improved. Has not been on abx in several weeks. Symptoms returning again a couple days ago and worse within past days. No fever or chills. Cough occasionally productive. No n/v. No CP. No unusual leg pain or swelling. Is a smoker,. CSN: [id number]  Arrival date & time 09/20/11  1658   First MD Initiated Contact with Patient 09/20/11 1711      Chief Complaint  Patient presents with  . Fever  . Headache  . Nasal Congestion  . Jaw Pain  . Shortness of Breath    (Consider location/radiation/quality/duration/timing/severity/associated sxs/prior treatment) HPI  Past Medical History  Diagnosis Date  . GERD (gastroesophageal reflux disease)   . Asthmatic bronchitis     history of  . Anxiety   . Depression   . H/O: suicide attempt     x 3    Past Surgical History  Procedure Date  . Cesarean section   . Appendectomy 2004  . Right tympanoplasty 1999  . Tonsillectomy   . Refractive surgery     History reviewed. No pertinent family history.  History  Substance Use Topics  . Smoking status: Current Some Day Smoker -- 1.0 packs/day for 24 years    Types: Cigarettes  . Smokeless tobacco: Never Used  . Alcohol Use: No    OB History    Grav Para Term Preterm Abortions TAB SAB Ect Mult Living                  Review of Systems   Review of symptoms negative unless otherwise noted in HPI.   Allergies  Sulfonamide derivatives  Home Medications   Current Outpatient Rx  Name Route Sig Dispense Refill  . ALBUTEROL 90 MCG/ACT IN AERS Inhalation Inhale 2 puffs into the lungs every 6 (six) hours as needed for wheezing. 17 g 12  . DIMENHYDRINATE 50 MG PO TABS Oral Take 50 mg by mouth every 8 (eight) hours as needed. Nausea    . FLUOXETINE HCL 20 MG PO TABS Oral Take 60 mg by mouth daily.      Marland Kitchen GABAPENTIN 600 MG PO  TABS Oral Take 600 mg by mouth 4 (four) times daily.      Marland Kitchen MECLIZINE HCL 25 MG PO TABS Oral Take 25 mg by mouth 3 (three) times daily as needed. DIZZINESS     . METHADONE HCL PO Oral Take 110 mg by mouth daily. PATIENT GOES TO [facility] FOR METHADONE     . THERA M PLUS PO TABS Oral Take 1 tablet by mouth daily.      Marland Kitchen OMEPRAZOLE 20 MG PO CPDR Oral Take 20 mg by mouth daily.      Marland Kitchen ZOLPIDEM TARTRATE 10 MG PO TABS Oral Take 10 mg by mouth at bedtime as needed. FOR SLEEP     . AZITHROMYCIN 250 MG PO TABS  2 tabs first day then 1 tab PO daily. 6 each 0  . DIAZEPAM 5 MG PO TABS Oral Take 1 tablet (5 mg total) by mouth 2 (two) times daily. 10 tablet 0    BP 128/75  Pulse 76  Temp(Src) 98.2 F (36.8 C) (Oral)  Resp 18  SpO2 99%  Physical Exam  Nursing note and vitals reviewed. Constitutional: She appears well-developed and well-nourished. No distress.  HENT:  Head: Normocephalic and  atraumatic.  Right Ear: External ear normal.  Left Ear: External ear normal.  Mouth/Throat: No oropharyngeal exudate.  Eyes: Conjunctivae are normal. Right eye exhibits no discharge. Left eye exhibits no discharge.  Neck: Normal range of motion. Neck supple.  Cardiovascular: Normal rate, regular rhythm and normal heart sounds.  Exam reveals no gallop and no friction rub.   No murmur heard. Pulmonary/Chest: Effort normal and breath sounds normal. No respiratory distress.       Occasional expiratory wheeze b/l  Abdominal: Soft. She exhibits no distension. There is no tenderness.  Musculoskeletal: She exhibits no edema and no tenderness.       Lower extremities symmetric as compared to each other. No calf tenderness. Negative Homan's. No palpable cords.   Lymphadenopathy:    She has no cervical adenopathy.  Neurological: She is alert.  Skin: Skin is warm and dry.  Psychiatric: She has a normal mood and affect. Her behavior is normal. Thought content normal.    ED Course    Procedures (including critical care time)  Labs Reviewed  CBC - Abnormal; Notable for the following:    WBC 11.9 (*)    All other components within normal limits  BASIC METABOLIC PANEL - Abnormal; Notable for the following:    BUN 5 (*)    All other components within normal limits  URINALYSIS, ROUTINE W REFLEX MICROSCOPIC  PREGNANCY, URINE  LAB REPORT - SCANNED   No results found.  Dg Chest 2 View  09/20/2011  *RADIOLOGY REPORT*  Clinical Data: Shortness of breath, chest pain and fever.  CHEST - 2 VIEW  Comparison: PA and lateral chest 04/16/2009 and 04/06/2011.  Findings: There is extensive bilateral peribronchial thickening. Patchy perihilar airspace disease is also identified.  No pneumothorax or effusion.  Heart size normal.  IMPRESSION: Bronchitic change with perihilar airspace disease compatible with infection, possibly atypical.  Original Report Authenticated By: Bernadene Bell. D'ALESSIO, M.D.   EKG:  Rhythm: sinus Rate: 94 Axis: normal Intervals: normal ST segments: NS ST changes   1. Community acquired pneumonia       MDM  40yF with cough and congestion. Suspect viral illness but will cover with abx for possible CAP. Feel pt is appropriate for outpt tx at this time. Return precautions dicussed. Outp fu.        Raeford Razor, MD 09/27/11 2239

## 2014-04-09 ENCOUNTER — Inpatient Hospital Stay (HOSPITAL_COMMUNITY)
Admission: EM | Admit: 2014-04-09 | Discharge: 2014-04-13 | DRG: 918 | Disposition: A | Discharge status: Other Acute Inpatient Hospital | Payer: Medicaid Other | Attending: Internal Medicine | Admitting: Internal Medicine

## 2014-04-09 ENCOUNTER — Encounter (HOSPITAL_COMMUNITY): Payer: Self-pay

## 2014-04-09 DIAGNOSIS — J45909 Unspecified asthma, uncomplicated: Secondary | ICD-10-CM | POA: Diagnosis present

## 2014-04-09 DIAGNOSIS — R740 Nonspecific elevation of levels of transaminase and lactic acid dehydrogenase [LDH]: Secondary | ICD-10-CM

## 2014-04-09 DIAGNOSIS — R9431 Abnormal electrocardiogram [ECG] [EKG]: Secondary | ICD-10-CM | POA: Diagnosis present

## 2014-04-09 DIAGNOSIS — T403X2A Poisoning by methadone, intentional self-harm, initial encounter: Secondary | ICD-10-CM | POA: Diagnosis present

## 2014-04-09 DIAGNOSIS — M6282 Rhabdomyolysis: Secondary | ICD-10-CM | POA: Diagnosis present

## 2014-04-09 DIAGNOSIS — I4581 Long QT syndrome: Secondary | ICD-10-CM

## 2014-04-09 DIAGNOSIS — Z720 Tobacco use: Secondary | ICD-10-CM | POA: Diagnosis present

## 2014-04-09 DIAGNOSIS — F329 Major depressive disorder, single episode, unspecified: Secondary | ICD-10-CM | POA: Diagnosis present

## 2014-04-09 DIAGNOSIS — R7401 Elevation of levels of liver transaminase levels: Secondary | ICD-10-CM | POA: Diagnosis present

## 2014-04-09 DIAGNOSIS — T424X2A Poisoning by benzodiazepines, intentional self-harm, initial encounter: Secondary | ICD-10-CM | POA: Diagnosis present

## 2014-04-09 DIAGNOSIS — Z882 Allergy status to sulfonamides status: Secondary | ICD-10-CM

## 2014-04-09 DIAGNOSIS — F419 Anxiety disorder, unspecified: Secondary | ICD-10-CM | POA: Diagnosis present

## 2014-04-09 DIAGNOSIS — Z569 Unspecified problems related to employment: Secondary | ICD-10-CM | POA: Diagnosis present

## 2014-04-09 DIAGNOSIS — Z609 Problem related to social environment, unspecified: Secondary | ICD-10-CM | POA: Diagnosis present

## 2014-04-09 DIAGNOSIS — K219 Gastro-esophageal reflux disease without esophagitis: Secondary | ICD-10-CM | POA: Diagnosis present

## 2014-04-09 DIAGNOSIS — F411 Generalized anxiety disorder: Secondary | ICD-10-CM | POA: Diagnosis present

## 2014-04-09 DIAGNOSIS — Z599 Problem related to housing and economic circumstances, unspecified: Secondary | ICD-10-CM

## 2014-04-09 DIAGNOSIS — F1120 Opioid dependence, uncomplicated: Secondary | ICD-10-CM | POA: Diagnosis present

## 2014-04-09 DIAGNOSIS — T50902A Poisoning by unspecified drugs, medicaments and biological substances, intentional self-harm, initial encounter: Secondary | ICD-10-CM

## 2014-04-09 DIAGNOSIS — T50901A Poisoning by unspecified drugs, medicaments and biological substances, accidental (unintentional), initial encounter: Secondary | ICD-10-CM | POA: Diagnosis present

## 2014-04-09 DIAGNOSIS — T43622A Poisoning by amphetamines, intentional self-harm, initial encounter: Principal | ICD-10-CM | POA: Diagnosis present

## 2014-04-09 DIAGNOSIS — E876 Hypokalemia: Secondary | ICD-10-CM | POA: Diagnosis present

## 2014-04-09 DIAGNOSIS — J4520 Mild intermittent asthma, uncomplicated: Secondary | ICD-10-CM

## 2014-04-09 DIAGNOSIS — Z915 Personal history of self-harm: Secondary | ICD-10-CM

## 2014-04-09 HISTORY — DX: Acute kidney failure, unspecified: N17.9

## 2014-04-09 LAB — RAPID URINE DRUG SCREEN, HOSP PERFORMED
AMPHETAMINES: POSITIVE — AB
BARBITURATES: NOT DETECTED
Benzodiazepines: POSITIVE — AB
Cocaine: NOT DETECTED
Opiates: NOT DETECTED
Tetrahydrocannabinol: NOT DETECTED

## 2014-04-09 LAB — CBC WITH DIFFERENTIAL/PLATELET
BASOS PCT: 0 % (ref 0–1)
Basophils Absolute: 0 10*3/uL (ref 0.0–0.1)
EOS ABS: 0 10*3/uL (ref 0.0–0.7)
EOS PCT: 0 % (ref 0–5)
HEMATOCRIT: 40.1 % (ref 36.0–46.0)
Hemoglobin: 12.8 g/dL (ref 12.0–15.0)
Lymphocytes Relative: 15 % (ref 12–46)
Lymphs Abs: 2.2 10*3/uL (ref 0.7–4.0)
MCH: 28.4 pg (ref 26.0–34.0)
MCHC: 31.9 g/dL (ref 30.0–36.0)
MCV: 89.1 fL (ref 78.0–100.0)
MONO ABS: 0.9 10*3/uL (ref 0.1–1.0)
Monocytes Relative: 6 % (ref 3–12)
NEUTROS ABS: 11.5 10*3/uL — AB (ref 1.7–7.7)
Neutrophils Relative %: 79 % — ABNORMAL HIGH (ref 43–77)
Platelets: 255 10*3/uL (ref 150–400)
RBC: 4.5 MIL/uL (ref 3.87–5.11)
RDW: 13.4 % (ref 11.5–15.5)
WBC: 14.5 10*3/uL — ABNORMAL HIGH (ref 4.0–10.5)

## 2014-04-09 LAB — URINE MICROSCOPIC-ADD ON

## 2014-04-09 LAB — COMPREHENSIVE METABOLIC PANEL
ALBUMIN: 4.1 g/dL (ref 3.5–5.2)
ALK PHOS: 106 U/L (ref 39–117)
ALT: 56 U/L — ABNORMAL HIGH (ref 0–35)
ALT: 57 U/L — ABNORMAL HIGH (ref 0–35)
ANION GAP: 13 (ref 5–15)
AST: 101 U/L — ABNORMAL HIGH (ref 0–37)
AST: 127 U/L — ABNORMAL HIGH (ref 0–37)
Albumin: 3.4 g/dL — ABNORMAL LOW (ref 3.5–5.2)
Alkaline Phosphatase: 123 U/L — ABNORMAL HIGH (ref 39–117)
Anion gap: 11 (ref 5–15)
BILIRUBIN TOTAL: 0.4 mg/dL (ref 0.3–1.2)
BILIRUBIN TOTAL: 0.3 mg/dL (ref 0.3–1.2)
BUN: 17 mg/dL (ref 6–23)
BUN: 14 mg/dL (ref 6–23)
CALCIUM: 9.3 mg/dL (ref 8.4–10.5)
CHLORIDE: 98 meq/L (ref 96–112)
CHLORIDE: 103 meq/L (ref 96–112)
CO2: 26 mEq/L (ref 19–32)
CO2: 27 mEq/L (ref 19–32)
Calcium: 8.8 mg/dL (ref 8.4–10.5)
Creatinine, Ser: 0.71 mg/dL (ref 0.50–1.10)
Creatinine, Ser: 0.83 mg/dL (ref 0.50–1.10)
GFR calc Af Amer: >90 mL/min (ref >90)
GFR calc non Af Amer: >90 mL/min (ref >90)
GFR calc non Af Amer: 85 mL/min — ABNORMAL LOW (ref >90)
GLUCOSE: 99 mg/dL (ref 70–99)
Glucose, Bld: 119 mg/dL — ABNORMAL HIGH (ref 70–99)
POTASSIUM: 3.8 meq/L (ref 3.7–5.3)
Potassium: 3.5 mEq/L — ABNORMAL LOW (ref 3.7–5.3)
Sodium: 137 mEq/L (ref 137–147)
Sodium: 141 mEq/L (ref 137–147)
Total Protein: 8 g/dL (ref 6.0–8.3)
Total Protein: 7 g/dL (ref 6.0–8.3)

## 2014-04-09 LAB — URINALYSIS, ROUTINE W REFLEX MICROSCOPIC
Bilirubin Urine: NEGATIVE
GLUCOSE, UA: NEGATIVE mg/dL
Ketones, ur: NEGATIVE mg/dL
LEUKOCYTES UA: NEGATIVE
Nitrite: NEGATIVE
PROTEIN: NEGATIVE mg/dL
Specific Gravity, Urine: 1.026 (ref 1.005–1.030)
Urobilinogen, UA: 0.2 mg/dL (ref 0.0–1.0)
pH: 5.5 (ref 5.0–8.0)

## 2014-04-09 LAB — CK
Total CK: 4548 U/L — ABNORMAL HIGH (ref 7–177)
Total CK: 5891 U/L — ABNORMAL HIGH (ref 7–177)

## 2014-04-09 LAB — SALICYLATE LEVEL

## 2014-04-09 LAB — ACETAMINOPHEN LEVEL: Acetaminophen (Tylenol), Serum: <15 ug/mL (ref 10–30)

## 2014-04-09 LAB — PREGNANCY, URINE: Preg Test, Ur: NEGATIVE

## 2014-04-09 LAB — ETHANOL

## 2014-04-09 MED ORDER — INFLUENZA VAC SPLIT QUAD 0.5 ML IM SUSY
0.5000 mL | PREFILLED_SYRINGE | Freq: Once | INTRAMUSCULAR | Status: DC
  Filled 2014-04-09: qty 0.5

## 2014-04-09 MED ORDER — SODIUM CHLORIDE 0.9 % IV BOLUS (SEPSIS)
500.0000 mL | Freq: Once | INTRAVENOUS | Status: AC
  Administered 2014-04-09: 500 mL via INTRAVENOUS

## 2014-04-09 MED ORDER — NICOTINE 21 MG/24HR TD PT24
21.0000 mg | MEDICATED_PATCH | Freq: Once | TRANSDERMAL | Status: AC
  Administered 2014-04-09: 21 mg via TRANSDERMAL
  Filled 2014-04-09: qty 1

## 2014-04-09 MED ORDER — SODIUM CHLORIDE 0.9 % IV BOLUS (SEPSIS)
1000.0000 mL | Freq: Once | INTRAVENOUS | Status: AC
  Administered 2014-04-09: 1000 mL via INTRAVENOUS

## 2014-04-09 MED ORDER — PNEUMOCOCCAL VAC POLYVALENT 25 MCG/0.5ML IJ INJ
0.5000 mL | INJECTION | Freq: Once | INTRAMUSCULAR | Status: DC
  Filled 2014-04-09: qty 0.5

## 2014-04-09 MED ORDER — IBUPROFEN 200 MG PO TABS
400.0000 mg | ORAL_TABLET | Freq: Once | ORAL | Status: AC
  Administered 2014-04-09: 400 mg via ORAL
  Filled 2014-04-09: qty 2

## 2014-04-09 NOTE — Progress Notes (Signed)
Called by Dr. Madilyn Hookees with concerns for admission on Mary Ewing given increased somnolent and lethargy. Patient in ED after she attempted suicidal overdose with home medications (methadone and  Benadryl); this suicide attempt happened approx 24 prior to ED visit. Patient is able to wake up, talked in oriented fashion but fall sleep again. Given the fact she has not been moving moving much in last 24-36 her CK is mildly elevated. Patient rest of blood work is essentially unremarkable. Initial plan was for observation and fluid resuscitation with potential use of narcan if lethargy/somnolence continues. After approx 10-15 minutes after conversation and case discussion, while walking to ED to evaluate patient by myself and start admission orders; patient was re-evaluated by ED physician and that moment patient was awake, talking properly, coherent and able to eat and drink. Request for admission was cancelled and plan was to observe further in ED and subsequently referred for psychiatry admission.   Mary Ewing, Mary Ewing [phone].

## 2014-04-09 NOTE — ED Provider Notes (Signed)
Patient visit shared. Patient check at 1530 patient somnolent but arousable to verbal stimuli. She easily falls back asleep during conversation. Plan to continue to monitor.  On recheck later in the emergency department visit patient with continued somnolence CPKs elevated. Discussed with medicine regarding admission for observation. On recheck later during emergency department stay patient is wide-awake and conversant tolerating oral fluids and feels sorry. Admission canceled at that time with plan to observe in the emergency department entrance or CPK. On recheck of CPK her levels are rising despite oral fluids and IV fluids. Discussed with medicine regarding admission for observation.  Tilden FossaElizabeth Yuriko Portales, MD 04/09/14 [phone]

## 2014-04-09 NOTE — Progress Notes (Signed)
  CARE MANAGEMENT ED NOTE 04/09/2014  Patient:  Mary Ewing,Mary Ewing   Account Number:  [id number]  Date Initiated:  04/09/2014  Documentation initiated by:  Radford PaxFERRERO,Delorean Knutzen  Subjective/Objective Assessment:   Patient presents to Ed after intentionally ingesting 500mg  of Methadone after having a fight with her mother     Subjective/Objective Assessment Detail:   Patient with pmhx of anxiety, depresson and suicide attempt x3     Action/Plan:   Action/Plan Detail:   Anticipated DC Date:       Status Recommendation to Physician:   Result of Recommendation:    Other ED Services  Consult Working Plan    DC Planning Services  CM consult  Other  PCP issues    Choice offered to / List presented to:            Status of service:  Completed, signed off  ED Comments:   ED Comments Detail:  [facility].  EDCM spoke to patient at bedside.  EDCM explained to patient [facility] with [facility] cost of [facility]one.  Patient reports that she has just gotten Medicaid insurnace and that there is a pcp listed on her card, she cannot remember the name of the physician. EDCM explained to patient that the physician name on her [employer] card is her pcp.  Medicaid card not in the system but confirmed patient does have Medicaid insurnace with registration.  Patient [facility] and just had appointment there last week. [facility], her counselor's name at the center is [place]et.  Patient has been on methadone for three years due to an opiate addiction.  No further EDCM needs at this time.

## 2014-04-09 NOTE — H&P (Signed)
[facility] History and Physical  [name] AVW:[id number] DOB: 26-Jan-1971 DOA: 04/09/2014  Referring physician: ED physician PCP: Evette Georges, MD  Specialists:   Chief Complaint: over dose  HPI: Mary Ewing is a 43 y.o. female with past medical history of depression, anxiety, GERD, asthma, who presents with drug overdose.  She reports that she overdosed 500 mg of methadone at around 4 PM yesterday, after she was yelled by her mother because of spending too long time on shopping holiday gift. She also took 2 pills of her boyfriend's Adderall, and 2 pills of Klonopin. After that, she felt sleepy, and had poor balance. She did not have fall or injure to herself. She did not have vision changes, hearing loss, hallucination, chest pain, shortness of breath, unilateral weakness. She denies fever, chills,  headaches, cough, chest pain, SOB, abdominal pain, diarrhea, constipation, dysuria, urgency, frequency, hematuria, skin rashes, joint pain or leg swelling. She was brought to ED by her mother today. When I evaluated patient in ED, she is drowsy, but orientated 3. She does not have suicidal or homicidal ideations currently.  Work up in the ED demonstrates CK 4548, repeated CK up to 5891; negative urinalysis; positive UDS with amphetamine and benzo; leukocytosis with WBC 14.5; elevated AST 127 and ALT 57; creatinine 0.83. The patient is admitted to inpatient for observation.  Review of Systems: As presented in the history of presenting illness, rest negative.  Where does patient live?  At mother's home Can patient participate in ADLs? Yes  Allergy:  Allergies  Allergen Reactions  . Sulfonamide Derivatives Hives    Past Medical History  Diagnosis Date  . GERD (gastroesophageal reflux disease)   . Asthmatic bronchitis     history of  . Anxiety   . Depression   . H/O: suicide attempt     x 3  . Acute renal failure     Past Surgical History  Procedure Laterality  Date  . Cesarean section    . Appendectomy  2004  . Right tympanoplasty  1999  . Tonsillectomy    . Refractive surgery    . Mouth surgery      teeth removed    Social History:  reports that she has been smoking Cigarettes.  She has a 24 pack-year smoking history. She has never used smokeless tobacco. She reports that she drinks alcohol. She reports that she uses illicit drugs.  Family History: History reviewed. No pertinent family history.   Prior to Admission medications   Medication Sig Start Date End Date Taking? Authorizing Provider  albuterol (PROVENTIL,VENTOLIN) 90 MCG/ACT inhaler Inhale 2 puffs into the lungs every 6 (six) hours as needed for wheezing. 04/10/11 04/09/14 Yes Penny Pia, MD  dimenhyDRINATE (DRAMAMINE) 50 MG tablet Take 50 mg by mouth every 8 (eight) hours as needed for dizziness. Nausea    Yes Historical Provider, MD  FLUoxetine (PROZAC) 20 MG tablet Take 60 mg by mouth every morning.    Yes Historical Provider, MD  gabapentin (NEURONTIN) 300 MG capsule Take 600 mg by mouth 3 (three) times daily.   Yes Historical Provider, MD  meclizine (ANTIVERT) 25 MG tablet Take 25 mg by mouth 3 (three) times daily as needed for dizziness.    Yes Historical Provider, MD  methadone (DOLOPHINE) 10 MG/ML solution Take 100 mg by mouth every morning. Supplied from [facility].   Yes Historical Provider, MD  Multiple Vitamins-Minerals (MULTIVITAMINS THER. W/MINERALS) TABS Take 1 tablet by mouth every morning.    Yes  Historical Provider, MD  omeprazole (PRILOSEC) 20 MG capsule Take 20 mg by mouth daily.     Yes Historical Provider, MD  azithromycin (ZITHROMAX) 250 MG tablet 2 tabs first day then 1 tab PO daily. Patient not taking: Reported on 04/09/2014 09/20/11   Raeford RazorStephen Kohut, MD    Physical Exam: Filed Vitals:   04/09/14 1952 04/09/14 2015 04/09/14 2130 04/09/14 2201  BP: 101/52 102/50 101/54 92/53  Pulse: 71 64 70 71  Temp:    98.8 F (37.1 C)  TempSrc:    Oral  Resp: 16   18   SpO2: 95% 90% 96% 93%   General: Not in acute distress. Dry mucous and membrane. HEENT:       Eyes: PERRL, EOMI, no scleral icterus       ENT: No discharge from the ears and nose, no pharynx injection, no tonsillar enlargement.        Neck: No JVD, no bruit, no mass felt. Cardiac: S1/S2, RRR, No murmurs, No gallops or rubs Pulm: Good air movement bilaterally. Clear to auscultation bilaterally. No rales, wheezing, rhonchi or rubs. Abd: Soft, nondistended, nontender, no rebound pain, no organomegaly, BS present Ext: No edema bilaterally. 2+DP/PT pulse bilaterally Musculoskeletal: No joint deformities, erythema, or stiffness, ROM full Skin: No rashes.  Neuro: Alert, drowsy, and oriented X3, cranial nerves II-XII grossly intact, muscle strength 5/5 in all extremeties, sensation to light touch intact. Brachial reflex 2+ bilaterally. Knee reflex 1+ bilaterally. Negative Babinski's sign. Normal finger to nose test. Psych: Patient is not psychotic, no suicidal or hemocidal ideation.  Labs on Admission:  Basic Metabolic Panel:  Recent Labs Lab 04/09/14 1156 04/09/14 2046  NA 137 141  K 3.5* 3.8  CL 98 103  CO2 26 27  GLUCOSE 119* 99  BUN 17 14  CREATININE 0.71 0.83  CALCIUM 9.3 8.8   Liver Function Tests:  Recent Labs Lab 04/09/14 1156 04/09/14 2046  AST 101* 127*  ALT 56* 57*  ALKPHOS 123* 106  BILITOT 0.4 0.3  PROT 8.0 7.0  ALBUMIN 4.1 3.4*   No results for input(s): LIPASE, AMYLASE in the last 168 hours. No results for input(s): AMMONIA in the last 168 hours. CBC:  Recent Labs Lab 04/09/14 1156  WBC 14.5*  NEUTROABS 11.5*  HGB 12.8  HCT 40.1  MCV 89.1  PLT 255   Cardiac Enzymes:  Recent Labs Lab 04/09/14 1529 04/09/14 2046  CKTOTAL 4548* 5891*    BNP (last 3 results) No results for input(s): PROBNP in the last 8760 hours. CBG: No results for input(s): GLUCAP in the last 168 hours.  Radiological Exams on Admission: No results found.  EKG:  Independently reviewed.   Assessment/Plan Principal Problem:   Overdose Active Problems:   Anxiety state   TOBACCO ABUSE   Extrinsic asthma   QT prolongation   Elevated transaminase level   Rhabdomyolysis  Over dose:  Including methadone, Adderall and Klonopin. Currently patient is hemodynamically stable. Drowsy, but oriented 3. Neuro examination is nonfocal. Patient does not have suicidal or homicidal ideations.  -will admit to med-surg bed for observation -hold all sedative medications: Including dimenhydrinate, Prozac, Neurontin, meclizine -sitter at bed side  Rhabdomyolysis: CK 4548, increasing to 5891 on repeated CK test. Likely due to overdose. Her renal function is normal, but has leukocytosis and increased transaminases. -Aggressive IV fluid: Normal saline 150 mL per hour -Repeat CK in morning  Elevated transaminases: AST 127, ALT 57, total bilirubin normal. It is most likely due to rhabdomyolysis. -  IV fluids as above -Check hepatitis panel -Check HIV antibody  Prolonged QT interval: QTC 521 on admission. Likely due to methadone overdose -Avoid QT prolonging medications, such as Zofran -repeat EKG in AM  Asthma: Stable, no signs of acute exacerbation. -Continue albuterol inhaler  Tobacco abuse: Smokes 1 pack a day for 27 years. -did counseling about importance of quitting smoking -Nicotine patch  Leukocytosis: with WBC 14.5. No signs infection. Likely due to stress - repeat CBC morning  Depression: Stable. Currentlyand all homicidal ideations. -Hold Prozac, may resume in morning if her mental status improves to baseline.  GERD: -PPI  DVT ppx: SQ Heparin     Code Status: Full code Family Communication: None at bed side.   Disposition Plan: Admit to inpatient   Date of Service 04/09/2014    Lorretta HarpIU, Shynice Sigel [facility] Pager [phone]  If 7PM-7AM, please contact night-coverage [email or web address] Password [email or web address] 04/09/2014, 10:45 PM

## 2014-04-09 NOTE — ED Notes (Addendum)
Patient comes from home following ingestion of 500 mg methadone.  Patient also took Klonopin and Aderral (which she has rx for).  Patient is prescribed 100 mg of methadone a day.  She has taken it for 3 years to assist with withdrawal from prescription medications.  Patient took the methadone intentionally after an argument with her mother.  Patient denies HI, but states she took the methadone to "get away from" her mother and to harm herself.  Patient denies hallucinations, both auditory and visual.  Patient is conscious, alert and oriented.  Patient denies pain currently and is drowsy, but conversant.  Patient does not want to live with her mother, but states she has no alternative.  On exam, patient's lung sounds are clear.  +2 radial and dorsalis pedis pulses palpated.  Heart sounds are normal with no S3, S4 or split noted.  Bowel sounds are normoactive and abdomen is soft and non-tender to palpation.  PERRL and MAE.

## 2014-04-09 NOTE — ED Notes (Signed)
Patient has redness, edema and bruising to left breast and chest.

## 2014-04-09 NOTE — ED Provider Notes (Signed)
CSN: [id number]     Arrival date & time 04/09/14  1107 History   First MD Initiated Contact with Patient 04/09/14 1123     Chief Complaint  Patient presents with  . Ingestion  . Suicide Attempt     (Consider location/radiation/quality/duration/timing/severity/associated sxs/prior Treatment) Patient is a 43 y.o. female presenting with Ingested Medication. The history is provided by the patient and a relative.  Ingestion Pertinent negatives include no chest pain, no abdominal pain and no shortness of breath.   is a patient reportedly overdosed at around 4 PM last night on 500 mg of methadone. It was her methadone and she is normally on 100 mg a day. She also may have taken some Xanax. Also found to have baclofen and Adderall. It was a suicide attempt. She reportedly slept outside last night. She still is somewhat sedate. Previous history of suicide attempts.  Past Medical History  Diagnosis Date  . GERD (gastroesophageal reflux disease)   . Asthmatic bronchitis     history of  . Anxiety   . Depression   . H/O: suicide attempt     x 3  . Acute renal failure    Past Surgical History  Procedure Laterality Date  . Cesarean section    . Appendectomy  2004  . Right tympanoplasty  1999  . Tonsillectomy    . Refractive surgery     History reviewed. No pertinent family history. History  Substance Use Topics  . Smoking status: Current Some Day Smoker -- 1.00 packs/day for 24 years    Types: Cigarettes  . Smokeless tobacco: Never Used  . Alcohol Use: Yes     Comment: occ   OB History    No data available     Review of Systems  Constitutional: Negative for fever and appetite change.  Respiratory: Negative for shortness of breath.   Cardiovascular: Negative for chest pain.  Gastrointestinal: Negative for abdominal pain.  Genitourinary: Negative for flank pain.  Musculoskeletal: Positive for back pain.  Neurological: Negative for seizures.  Psychiatric/Behavioral: Positive  for suicidal ideas.      Allergies  Sulfonamide derivatives  Home Medications   Prior to Admission medications   Medication Sig Start Date End Date Taking? Authorizing Provider  albuterol (PROVENTIL,VENTOLIN) 90 MCG/ACT inhaler Inhale 2 puffs into the lungs every 6 (six) hours as needed for wheezing. 04/10/11 04/09/14 Yes Penny Piarlando Vega, MD  dimenhyDRINATE (DRAMAMINE) 50 MG tablet Take 50 mg by mouth every 8 (eight) hours as needed for dizziness. Nausea    Yes Historical Provider, MD  FLUoxetine (PROZAC) 20 MG tablet Take 60 mg by mouth every morning.    Yes Historical Provider, MD  gabapentin (NEURONTIN) 300 MG capsule Take 600 mg by mouth 3 (three) times daily.   Yes Historical Provider, MD  meclizine (ANTIVERT) 25 MG tablet Take 25 mg by mouth 3 (three) times daily as needed for dizziness.    Yes Historical Provider, MD  methadone (DOLOPHINE) 10 MG/ML solution Take 100 mg by mouth every morning. [facility].   Yes Historical Provider, MD  Multiple Vitamins-Minerals (MULTIVITAMINS THER. W/MINERALS) TABS Take 1 tablet by mouth every morning.    Yes Historical Provider, MD  omeprazole (PRILOSEC) 20 MG capsule Take 20 mg by mouth daily.     Yes Historical Provider, MD  azithromycin (ZITHROMAX) 250 MG tablet 2 tabs first day then 1 tab PO daily. Patient not taking: Reported on 04/09/2014 09/20/11   Raeford RazorStephen Kohut, MD   BP 99/53 mmHg  Pulse 71  Temp(Src) 98.7 F (37.1 C) (Oral)  Resp 18  SpO2 98% Physical Exam  Constitutional: She appears well-developed.  HENT:  Head: Atraumatic.  Eyes: Pupils are equal, round, and reactive to light.  Cardiovascular: Normal rate.   Pulmonary/Chest: Effort normal.  Abdominal: There is no tenderness.  Neurological: She is alert.  Patient is somewhat sedate but otherwise appropriate.  Skin: Skin is warm.  Psychiatric:  Patient appears somewhat depressed    ED Course  Procedures (including critical care time) Labs Review Labs Reviewed   CBC WITH DIFFERENTIAL - Abnormal; Notable for the following:    WBC 14.5 (*)    Neutrophils Relative % 79 (*)    Neutro Abs 11.5 (*)    All other components within normal limits  COMPREHENSIVE METABOLIC PANEL - Abnormal; Notable for the following:    Potassium 3.5 (*)    Glucose, Bld 119 (*)    AST 101 (*)    ALT 56 (*)    Alkaline Phosphatase 123 (*)    All other components within normal limits  SALICYLATE LEVEL - Abnormal; Notable for the following:    Salicylate Lvl <2.0 (*)    All other components within normal limits  URINALYSIS, ROUTINE W REFLEX MICROSCOPIC - Abnormal; Notable for the following:    Color, Urine AMBER (*)    APPearance CLOUDY (*)    Hgb urine dipstick TRACE (*)    All other components within normal limits  URINE RAPID DRUG SCREEN (HOSP PERFORMED) - Abnormal; Notable for the following:    Benzodiazepines POSITIVE (*)    Amphetamines POSITIVE (*)    All other components within normal limits  URINE MICROSCOPIC-ADD ON - Abnormal; Notable for the following:    Bacteria, UA MANY (*)    Casts HYALINE CASTS (*)    All other components within normal limits  ETHANOL  ACETAMINOPHEN LEVEL  PREGNANCY, URINE  CK    Imaging Review No results found.   EKG Interpretation   Date/Time:  Thursday April 09 2014 11:30:31 EST Ventricular Rate:  89 PR Interval:  156 QRS Duration: 96 QT Interval:  428 QTC Calculation: 521 R Axis:   53 Text Interpretation:  Sinus rhythm Prolonged QT interval No significant  change since last tracing Confirmed by Rubin PayorPICKERING  MD, Harrold DonathNATHAN [phone]([id number]) on  04/09/2014 2:11:58 PM      MDM   Final diagnoses:  Overdose, intentional self-harm, initial encounter    Patient with overdose on methadone. Somewhat sedated and will require more monitoring. D/w poison control. EKG reassuring. Care turned over to Dr Madilyn Hookees. Overdose was a suicide attempt, but patient is voluntary at this time.  fr  Juliet RudeNathan R. Rubin PayorPickering, MD 04/09/14 1550

## 2014-04-09 NOTE — ED Notes (Signed)
Patient received lunch tray.  Mother at bedside helping her eat.

## 2014-04-09 NOTE — ED Notes (Signed)
TTS at bedside, but patient too somnolent to answer their questions and speak with them.

## 2014-04-09 NOTE — BH Assessment (Signed)
Assessment Note  Mary Ewing is an 43 y.o. female with history of depression and anxiety. Patient presents to [facility] following a ingestion of 500 mg methadone. Patient denies HI and AVH's. Per ED notes, patient  states she took the methadone to "get away from" her mother and to harm herself.Patient is prescribed 100 mg of methadone a day. She has taken it for 3 years to assist with withdrawal from prescription medications. Patient took the methadone intentionally after an argument with her mother. Patient has made similar suicide attempts in the past in addition to cutting her wrist. Patient reports on-going depressive symptoms (loss of interest in usual pleasures, crying spells, fatigue, and hopelessness). Her depression is triggered by conflicts with her mother, umemployment, and finances. Patient reports a increase in anxiety. Her appetite is poor. She also reports a decrease in sleep.  Patient receives outpatient mental health therapy in addition to methadone maintenance at [facility]. She reports a prior hospitalization at [facility] for suicidal ideations/attempts/gestures. Patient does not identify and supports.    Axis I: Major Depressive Disorder, Recurrent, Severe, without psychotic features Axis II: Deferred Axis III:  Past Medical History  Diagnosis Date  . GERD (gastroesophageal reflux disease)   . Asthmatic bronchitis     history of  . Anxiety   . Depression   . H/O: suicide attempt     x 3  . Acute renal failure    Axis IV: other psychosocial or environmental problems, problems related to social environment, problems with access to health care services and problems with primary support group Axis V: 31-40 impairment in reality testing  Past Medical History:  Past Medical History  Diagnosis Date  . GERD (gastroesophageal reflux disease)   . Asthmatic bronchitis     history of  . Anxiety   . Depression   . H/O: suicide attempt     x 3  . Acute renal failure      Past Surgical History  Procedure Laterality Date  . Cesarean section    . Appendectomy  2004  . Right tympanoplasty  1999  . Tonsillectomy    . Refractive surgery      Family History: History reviewed. No pertinent family history.  Social History:  reports that she has been smoking Cigarettes.  She has a 24 pack-year smoking history. She has never used smokeless tobacco. She reports that she drinks alcohol. She reports that she uses illicit drugs.  Additional Social History:  Alcohol / Drug Use Pain Medications: SEE MAR Prescriptions: SEE MAR Over the Counter: SEE MAR History of alcohol / drug use?: Yes Substance #1 Name of Substance 1: Patient has a history of opiate dependece. 1 - Age of First Use: unk 1 - Amount (size/oz): unk 1 - Frequency: unk 1 - Duration: unk 1 - Last Use / Amount: unk  CIWA: CIWA-Ar BP: 112/67 mmHg Pulse Rate: 74 COWS:    Allergies:  Allergies  Allergen Reactions  . Sulfonamide Derivatives Hives    REACTION: hives    Home Medications:  (Not in a hospital admission)  OB/GYN Status:  No LMP recorded. Patient is postmenopausal.  General Assessment Data Location of Assessment: [facility] ED Is this a Tele or Face-to-Face Assessment?: Face-to-Face Is this an Initial Assessment or a Re-assessment for this encounter?: Initial Assessment Living Arrangements: Other (Comment) (lives with mother ) Can pt return to current living arrangement?: Yes Admission Status: Voluntary Is patient capable of signing voluntary admission?: Yes Transfer from: Acute Hospital Referral  Source: Self/Family/Friend     [facility] Living Arrangements: Other (Comment) (lives with mother ) Name of Psychiatrist:  [facility] ) Name of Therapist:  [facility] )  Education Status Is patient currently in school?: No  Risk to self with the past 6 months Suicidal Ideation: Yes-Currently Present Suicidal Intent: Yes-Currently Present Is patient at  risk for suicide?: Yes Suicidal Plan?: Yes-Currently Present Specify Current Suicidal Plan:  (patient overdosed taking 500mg 's of methodone yesterday) Access to Means: Yes Specify Access to Suicidal Means:  (methodone ) What has been your use of drugs/alcohol within the last 12 months?:  (methodone ) Previous Attempts/Gestures: Yes (patient has cuts on wrist (superficial)) How many times?:  ("many") Other Self Harm Risks:  (none reported ) Triggers for Past Attempts: Other (Comment) (arguments with mom and depression) Intentional Self Injurious Behavior: None Family Suicide History: Unknown Recent stressful life event(s): Other (Comment), Job Loss, Financial Problems Persecutory voices/beliefs?: No Depression: Yes Depression Symptoms: Feeling angry/irritable, Feeling worthless/self pity, Guilt, Isolating, Insomnia, Despondent, Tearfulness, Fatigue, Loss of interest in usual pleasures Substance abuse history and/or treatment for substance abuse?: No Suicide prevention information given to non-admitted patients: Not applicable  Risk to Others within the past 6 months Homicidal Ideation: No Thoughts of Harm to Others: No Current Homicidal Intent: No Current Homicidal Plan: No Access to Homicidal Means: No Identified Victim:  (n/a) History of harm to others?: No Assessment of Violence: None Noted Violent Behavior Description:  (patient calm and cooperative ) Does patient have access to weapons?: No Criminal Charges Pending?: No Does patient have a court date: No  Psychosis Hallucinations: None noted Delusions: None noted  Mental Status Report Appear/Hygiene: Disheveled Eye Contact: Poor Motor Activity: Freedom of movement Speech: Logical/coherent Level of Consciousness: Alert Mood: Depressed Affect: Appropriate to circumstance Anxiety Level: Severe Thought Processes: Coherent, Relevant Judgement: Impaired Orientation: Place, Time, Situation, Person Obsessive Compulsive  Thoughts/Behaviors: None  Cognitive Functioning Concentration: Decreased Memory: Recent Intact, Remote Intact IQ: Average Insight: Fair Impulse Control: Fair Appetite: Poor Weight Loss:  (none reported ) Weight Gain:  (none reported ) Sleep: No Change Total Hours of Sleep:  (varies ) Vegetative Symptoms: None  ADLScreening [facility] Assessment Services) Patient's cognitive ability adequate to safely complete daily activities?: Yes Patient able to express need for assistance with ADLs?: Yes Independently performs ADLs?: Yes (appropriate for developmental age)  Prior Inpatient Therapy Prior Inpatient Therapy: Yes Prior Therapy Dates:  ([facility]-pt unable to recall date of admission) Prior Therapy Facilty/Provider(s):  [facility]) Reason for Treatment:  (suicidal and substance abuse)  Prior Outpatient Therapy Prior Outpatient Therapy: Yes Prior Therapy Dates:  (current) Prior Therapy Facilty/Provider(s):  [facility] ) Reason for Treatment:  (methodone treatment )  ADL Screening (condition at time of admission) Patient's cognitive ability adequate to safely complete daily activities?: Yes Is the patient deaf or have difficulty hearing?: No Does the patient have difficulty seeing, even when wearing glasses/contacts?: No Does the patient have difficulty concentrating, remembering, or making decisions?: No Patient able to express need for assistance with ADLs?: Yes Does the patient have difficulty dressing or bathing?: No Independently performs ADLs?: Yes (appropriate for developmental age) Does the patient have difficulty walking or climbing stairs?: No Weakness of Legs: None Weakness of Arms/Hands: None  Home Assistive Devices/Equipment Home Assistive Devices/Equipment: None    Abuse/Neglect Assessment (Assessment to be complete while patient is alone) Physical Abuse: Denies Verbal Abuse: Denies Sexual Abuse: Denies Exploitation of patient/patient's resources:  Denies Self-Neglect: Denies  Values / Beliefs Cultural Requests During Hospitalization: None Spiritual Requests During Hospitalization: None   Advance Directives (For Healthcare) Does patient have an advance directive?: Yes, No Would patient like information on creating an advanced directive?: No - patient declined information Does patient want to make changes to advanced directive?: No - Patient declined Nutrition Screen- [facility] Patient's home diet: Regular  Additional Information 1:1 In Past 12 Months?: No CIRT Risk: No Elopement Risk: No Does patient have medical clearance?: Yes     Disposition:  Disposition Initial Assessment Completed for this Encounter: Yes (Pending inpatient placement) Disposition of Patient: Inpatient treatment program [facility](Mary Eibach, NP recommends inpatient treatment. )  On Site Evaluation by:   Reviewed with Physician:    Melynda RipplePerry, [facility] 04/09/2014 12:29 PM

## 2014-04-09 NOTE — ED Notes (Addendum)
Pt presents after an intentional ingestion of 500mg  Methadone around 4:30pm yesterday, after a fight w/ her mother.  C/o generalized body aches and chronic back pain.  Pain score 7/10.  Pt reports that she slept outside last night and has generalized bruising.  Pt is lethargic, A & Ox4.  Denies SI/HI/AV.  Hx of previous suicide attempts.  Sts she is prescribed 100mg  Methadone per day.

## 2014-04-10 DIAGNOSIS — Z599 Problem related to housing and economic circumstances, unspecified: Secondary | ICD-10-CM | POA: Diagnosis not present

## 2014-04-10 DIAGNOSIS — M6282 Rhabdomyolysis: Secondary | ICD-10-CM | POA: Diagnosis present

## 2014-04-10 DIAGNOSIS — Z882 Allergy status to sulfonamides status: Secondary | ICD-10-CM | POA: Diagnosis not present

## 2014-04-10 DIAGNOSIS — F419 Anxiety disorder, unspecified: Secondary | ICD-10-CM | POA: Diagnosis present

## 2014-04-10 DIAGNOSIS — Z720 Tobacco use: Secondary | ICD-10-CM | POA: Diagnosis not present

## 2014-04-10 DIAGNOSIS — R740 Nonspecific elevation of levels of transaminase and lactic acid dehydrogenase [LDH]: Secondary | ICD-10-CM | POA: Diagnosis present

## 2014-04-10 DIAGNOSIS — F329 Major depressive disorder, single episode, unspecified: Secondary | ICD-10-CM

## 2014-04-10 DIAGNOSIS — J45909 Unspecified asthma, uncomplicated: Secondary | ICD-10-CM | POA: Diagnosis present

## 2014-04-10 DIAGNOSIS — F1120 Opioid dependence, uncomplicated: Secondary | ICD-10-CM

## 2014-04-10 DIAGNOSIS — E876 Hypokalemia: Secondary | ICD-10-CM | POA: Diagnosis present

## 2014-04-10 DIAGNOSIS — Z609 Problem related to social environment, unspecified: Secondary | ICD-10-CM | POA: Diagnosis present

## 2014-04-10 DIAGNOSIS — T424X2A Poisoning by benzodiazepines, intentional self-harm, initial encounter: Secondary | ICD-10-CM | POA: Diagnosis present

## 2014-04-10 DIAGNOSIS — T50902A Poisoning by unspecified drugs, medicaments and biological substances, intentional self-harm, initial encounter: Secondary | ICD-10-CM | POA: Diagnosis not present

## 2014-04-10 DIAGNOSIS — Z915 Personal history of self-harm: Secondary | ICD-10-CM | POA: Diagnosis not present

## 2014-04-10 DIAGNOSIS — T43622A Poisoning by amphetamines, intentional self-harm, initial encounter: Secondary | ICD-10-CM | POA: Diagnosis present

## 2014-04-10 DIAGNOSIS — Z569 Unspecified problems related to employment: Secondary | ICD-10-CM | POA: Diagnosis present

## 2014-04-10 DIAGNOSIS — K219 Gastro-esophageal reflux disease without esophagitis: Secondary | ICD-10-CM | POA: Diagnosis present

## 2014-04-10 DIAGNOSIS — T403X2A Poisoning by methadone, intentional self-harm, initial encounter: Secondary | ICD-10-CM | POA: Diagnosis present

## 2014-04-10 LAB — COMPREHENSIVE METABOLIC PANEL
ALBUMIN: 3.1 g/dL — AB (ref 3.5–5.2)
ALK PHOS: 95 U/L (ref 39–117)
ALT: 60 U/L — ABNORMAL HIGH (ref 0–35)
AST: 132 U/L — ABNORMAL HIGH (ref 0–37)
Anion gap: 12 (ref 5–15)
BILIRUBIN TOTAL: 0.3 mg/dL (ref 0.3–1.2)
BUN: 12 mg/dL (ref 6–23)
CHLORIDE: 109 meq/L (ref 96–112)
CO2: 25 mEq/L (ref 19–32)
Calcium: 8.3 mg/dL — ABNORMAL LOW (ref 8.4–10.5)
Creatinine, Ser: 0.76 mg/dL (ref 0.50–1.10)
GFR calc Af Amer: >90 mL/min (ref >90)
GFR calc non Af Amer: >90 mL/min (ref >90)
Glucose, Bld: 81 mg/dL (ref 70–99)
POTASSIUM: 3.5 meq/L — AB (ref 3.7–5.3)
SODIUM: 146 meq/L (ref 137–147)
TOTAL PROTEIN: 6.5 g/dL (ref 6.0–8.3)

## 2014-04-10 LAB — HIV ANTIBODY (ROUTINE TESTING W REFLEX): HIV 1&2 Ab, 4th Generation: NONREACTIVE

## 2014-04-10 LAB — HEPATITIS PANEL, ACUTE
HCV Ab: NEGATIVE
HEP A IGM: NONREACTIVE
HEP B C IGM: NONREACTIVE
Hepatitis B Surface Ag: NEGATIVE

## 2014-04-10 LAB — CBC
HEMATOCRIT: 35.8 % — AB (ref 36.0–46.0)
HEMOGLOBIN: 11.4 g/dL — AB (ref 12.0–15.0)
MCH: 28.4 pg (ref 26.0–34.0)
MCHC: 31.8 g/dL (ref 30.0–36.0)
MCV: 89.3 fL (ref 78.0–100.0)
Platelets: 175 10*3/uL (ref 150–400)
RBC: 4.01 MIL/uL (ref 3.87–5.11)
RDW: 13.8 % (ref 11.5–15.5)
WBC: 8.6 10*3/uL (ref 4.0–10.5)

## 2014-04-10 LAB — PROTIME-INR
INR: 1.13 (ref 0.00–1.49)
Prothrombin Time: 14.6 seconds (ref 11.6–15.2)

## 2014-04-10 LAB — CK: Total CK: 5143 U/L — ABNORMAL HIGH (ref 7–177)

## 2014-04-10 LAB — GLUCOSE, CAPILLARY: GLUCOSE-CAPILLARY: 72 mg/dL (ref 70–99)

## 2014-04-10 MED ORDER — FLUOXETINE HCL 20 MG PO CAPS
60.0000 mg | ORAL_CAPSULE | Freq: Every day | ORAL | Status: DC
  Administered 2014-04-10 – 2014-04-13 (×4): 60 mg via ORAL
  Filled 2014-04-10 (×4): qty 3

## 2014-04-10 MED ORDER — GABAPENTIN 300 MG PO CAPS
600.0000 mg | ORAL_CAPSULE | Freq: Three times a day (TID) | ORAL | Status: DC
  Administered 2014-04-10 – 2014-04-13 (×11): 600 mg via ORAL
  Filled 2014-04-10 (×12): qty 2

## 2014-04-10 MED ORDER — ASPIRIN-ACETAMINOPHEN-CAFFEINE 250-250-65 MG PO TABS
2.0000 | ORAL_TABLET | Freq: Two times a day (BID) | ORAL | Status: DC | PRN
  Administered 2014-04-10 – 2014-04-13 (×5): 2 via ORAL
  Filled 2014-04-10 (×7): qty 2

## 2014-04-10 MED ORDER — PANTOPRAZOLE SODIUM 40 MG PO TBEC
40.0000 mg | DELAYED_RELEASE_TABLET | Freq: Every day | ORAL | Status: DC
  Administered 2014-04-10 – 2014-04-13 (×4): 40 mg via ORAL
  Filled 2014-04-10 (×5): qty 1

## 2014-04-10 MED ORDER — POTASSIUM CHLORIDE CRYS ER 20 MEQ PO TBCR
20.0000 meq | EXTENDED_RELEASE_TABLET | Freq: Once | ORAL | Status: AC
  Administered 2014-04-10: 20 meq via ORAL
  Filled 2014-04-10: qty 1

## 2014-04-10 MED ORDER — SODIUM CHLORIDE 0.9 % IV SOLN
INTRAVENOUS | Status: DC
  Administered 2014-04-10 – 2014-04-12 (×5): via INTRAVENOUS

## 2014-04-10 MED ORDER — NICOTINE 21 MG/24HR TD PT24
21.0000 mg | MEDICATED_PATCH | Freq: Once | TRANSDERMAL | Status: AC
  Administered 2014-04-10: 21 mg via TRANSDERMAL
  Filled 2014-04-10: qty 1

## 2014-04-10 MED ORDER — IBUPROFEN 800 MG PO TABS
800.0000 mg | ORAL_TABLET | Freq: Once | ORAL | Status: AC
  Administered 2014-04-10: 800 mg via ORAL
  Filled 2014-04-10: qty 1

## 2014-04-10 MED ORDER — HEPARIN SODIUM (PORCINE) 5000 UNIT/ML IJ SOLN
5000.0000 [IU] | Freq: Three times a day (TID) | INTRAMUSCULAR | Status: DC
  Administered 2014-04-10 – 2014-04-12 (×9): 5000 [IU] via SUBCUTANEOUS
  Filled 2014-04-10 (×14): qty 1

## 2014-04-10 MED ORDER — ALBUTEROL SULFATE (2.5 MG/3ML) 0.083% IN NEBU: 3.0000 mL | INHALATION_SOLUTION | Freq: Four times a day (QID) | RESPIRATORY_TRACT | Status: DC | PRN

## 2014-04-10 MED ORDER — ADULT MULTIVITAMIN W/MINERALS CH
1.0000 | ORAL_TABLET | Freq: Every morning | ORAL | Status: DC
  Administered 2014-04-10 – 2014-04-13 (×4): 1 via ORAL
  Filled 2014-04-10 (×4): qty 1

## 2014-04-10 NOTE — Consult Note (Signed)
[facility] [place] [facility] Consult   Reason for Consult:  Depression, Suicidal Attempt Referring Physician:  Dr. Dion Body is an 43 y.o. female. Total Time spent with patient: 35 minutes   Assessment: AXIS I:   Major Depression, Opiate Dependnence  AXIS II:  Deferred AXIS III:   Past Medical History  Diagnosis Date  . GERD (gastroesophageal reflux disease)   . Asthmatic bronchitis     history of  . Anxiety   . Depression   . H/O: suicide attempt     x 3  . Acute renal failure    AXIS IV:  economic problems, housing problems, occupational problems and problems related to social environment AXIS V:  41-50 serious symptoms  Plan:  Recommend psychiatric Inpatient admission when medically cleared.  Subjective:   Mary Ewing is a 43 y.o. female patient admitted with  Overdose on methadone.  HPI:   43 year old female. Lives with mother. Reports a series of stressors- she is unemployed, and has applied for disability but does not have it yet. Therefore , she has been living with her mother. States she has a difficult relationship with mother, and they often argue and fight ( verbally). States mother often threatens her to stop paying for patient's methadone maintenance . They recently had a serious argument- patient felt very hurt by mother's criticisms and impulsively overdosed on 500 mgrs of Methadone. She states overdose was not planned- she did take all methadone available to her at the time. States that prior to the above she was " doing better" and was not feeling severely depressed or suicidal. At this time, however, does endorse depression, sadness, anhedonia. Denies ongoing plan or intention of hurting self or anyone else at this time. Of note, states she has been on Prozac for a period of months to years and feels Prozac has generally been well tolerated and effective. HPI Elements:   Serious drug overdose in the context of psychosocial stressors and interpersonal  discord.   Past Psychiatric History: Patient reports history of depression- has had three prior psychiatric admissions for depression, suicidal attempts. Denies history of mania, denies history of psychosis. As noted, has been on prozac with good response. She has a  History of opiate dependence, mainly opiate analgesics but also heroin in the past. Over a period of months has been on methadone maintenance, reports dose as 100 mgrs Daily.  Past Medical History  Diagnosis Date  . GERD (gastroesophageal reflux disease)   . Asthmatic bronchitis     history of  . Anxiety   . Depression   . H/O: suicide attempt     x 3  . Acute renal failure     reports that she has been smoking Cigarettes.  She has a 24 pack-year smoking history. She has never used smokeless tobacco. She reports that she drinks alcohol. She reports that she uses illicit drugs. History reviewed. No pertinent family history. Family History Substance Abuse: No Family Supports: No Living Arrangements: Parent (mother) Can pt return to current living arrangement?: Yes Abuse/Neglect [facility]) Physical Abuse: Denies Verbal Abuse: Denies Sexual Abuse: Denies Allergies:   Allergies  Allergen Reactions  . Sulfonamide Derivatives Hives     Objective: Blood pressure 118/61, pulse 77, temperature 98.6 F (37 C), temperature source Oral, resp. rate 20, height '5\' 2"'  (1.575 m), weight 85.911 kg (189 lb 6.4 oz), SpO2 95 %.Body mass index is 34.63 kg/(m^2). Results for orders placed or performed during the hospital encounter  of 04/09/14 (from the past 72 hour(s))  CBC with Differential     Status: Abnormal   Collection Time: 04/09/14 11:56 AM  Result Value Ref Range   WBC 14.5 (H) 4.0 - 10.5 K/uL   RBC 4.50 3.87 - 5.11 MIL/uL   Hemoglobin 12.8 12.0 - 15.0 g/dL   HCT 40.1 36.0 - 46.0 %   MCV 89.1 78.0 - 100.0 fL   MCH 28.4 26.0 - 34.0 pg   MCHC 31.9 30.0 - 36.0 g/dL   RDW 13.4 11.5 - 15.5 %   Platelets 255 150 - 400 K/uL    Neutrophils Relative % 79 (H) 43 - 77 %   Neutro Abs 11.5 (H) 1.7 - 7.7 K/uL   Lymphocytes Relative 15 12 - 46 %   Lymphs Abs 2.2 0.7 - 4.0 K/uL   Monocytes Relative 6 3 - 12 %   Monocytes Absolute 0.9 0.1 - 1.0 K/uL   Eosinophils Relative 0 0 - 5 %   Eosinophils Absolute 0.0 0.0 - 0.7 K/uL   Basophils Relative 0 0 - 1 %   Basophils Absolute 0.0 0.0 - 0.1 K/uL  Comprehensive metabolic panel     Status: Abnormal   Collection Time: 04/09/14 11:56 AM  Result Value Ref Range   Sodium 137 137 - 147 mEq/L   Potassium 3.5 (L) 3.7 - 5.3 mEq/L   Chloride 98 96 - 112 mEq/L   CO2 26 19 - 32 mEq/L   Glucose, Bld 119 (H) 70 - 99 mg/dL   BUN 17 6 - 23 mg/dL   Creatinine, Ser 0.71 0.50 - 1.10 mg/dL   Calcium 9.3 8.4 - 10.5 mg/dL   Total Protein 8.0 6.0 - 8.3 g/dL   Albumin 4.1 3.5 - 5.2 g/dL   AST 101 (H) 0 - 37 U/L   ALT 56 (H) 0 - 35 U/L   Alkaline Phosphatase 123 (H) 39 - 117 U/L   Total Bilirubin 0.4 0.3 - 1.2 mg/dL   GFR calc non Af Amer >90 >90 mL/min   GFR calc Af Amer >90 >90 mL/min    Comment: (NOTE) The eGFR has been calculated using the CKD EPI equation. This calculation has not been validated in all clinical situations. eGFR's persistently <90 mL/min signify possible Chronic Kidney Disease.    Anion gap 13 5 - 15  Ethanol     Status: None   Collection Time: 04/09/14 11:56 AM  Result Value Ref Range   Alcohol, Ethyl (B) <11 0 - 11 mg/dL    Comment:        LOWEST DETECTABLE LIMIT FOR SERUM ALCOHOL IS 11 mg/dL FOR MEDICAL PURPOSES ONLY   Salicylate level     Status: Abnormal   Collection Time: 04/09/14 11:56 AM  Result Value Ref Range   Salicylate Lvl <9.3 (L) 2.8 - 20.0 mg/dL  Acetaminophen level     Status: None   Collection Time: 04/09/14 11:56 AM  Result Value Ref Range   Acetaminophen (Tylenol), Serum <15.0 10 - 30 ug/mL    Comment:        THERAPEUTIC CONCENTRATIONS VARY SIGNIFICANTLY. A RANGE OF 10-30 ug/mL MAY BE AN EFFECTIVE CONCENTRATION FOR MANY  PATIENTS. HOWEVER, SOME ARE BEST TREATED AT CONCENTRATIONS OUTSIDE THIS RANGE. ACETAMINOPHEN CONCENTRATIONS >150 ug/mL AT 4 HOURS AFTER INGESTION AND >50 ug/mL AT 12 HOURS AFTER INGESTION ARE OFTEN ASSOCIATED WITH TOXIC REACTIONS.   Urinalysis, Routine w reflex microscopic     Status: Abnormal   Collection Time: 04/09/14  1:14 PM  Result Value Ref Range   Color, Urine AMBER (A) YELLOW    Comment: BIOCHEMICALS MAY BE AFFECTED BY COLOR   APPearance CLOUDY (A) CLEAR   Specific Gravity, Urine 1.026 1.005 - 1.030   pH 5.5 5.0 - 8.0   Glucose, UA NEGATIVE NEGATIVE mg/dL   Hgb urine dipstick TRACE (A) NEGATIVE   Bilirubin Urine NEGATIVE NEGATIVE   Ketones, ur NEGATIVE NEGATIVE mg/dL   Protein, ur NEGATIVE NEGATIVE mg/dL   Urobilinogen, UA 0.2 0.0 - 1.0 mg/dL   Nitrite NEGATIVE NEGATIVE   Leukocytes, UA NEGATIVE NEGATIVE  Pregnancy, urine     Status: None   Collection Time: 04/09/14  1:14 PM  Result Value Ref Range   Preg Test, Ur NEGATIVE NEGATIVE    Comment:        THE SENSITIVITY OF THIS METHODOLOGY IS >20 mIU/mL.   Urine microscopic-add on     Status: Abnormal   Collection Time: 04/09/14  1:14 PM  Result Value Ref Range   Squamous Epithelial / LPF RARE RARE   RBC / HPF 3-6 <3 RBC/hpf   Bacteria, UA MANY (A) RARE   Casts HYALINE CASTS (A) NEGATIVE   Urine-Other MUCOUS PRESENT     Comment: AMORPHOUS URATES/PHOSPHATES  Drug screen panel, emergency     Status: Abnormal   Collection Time: 04/09/14  1:15 PM  Result Value Ref Range   Opiates NONE DETECTED NONE DETECTED   Cocaine NONE DETECTED NONE DETECTED   Benzodiazepines POSITIVE (A) NONE DETECTED   Amphetamines POSITIVE (A) NONE DETECTED   Tetrahydrocannabinol NONE DETECTED NONE DETECTED   Barbiturates NONE DETECTED NONE DETECTED    Comment:        DRUG SCREEN FOR MEDICAL PURPOSES ONLY.  IF CONFIRMATION IS NEEDED FOR ANY PURPOSE, NOTIFY LAB WITHIN 5 DAYS.        LOWEST DETECTABLE LIMITS FOR URINE DRUG SCREEN Drug  Class       Cutoff (ng/mL) Amphetamine      1000 Barbiturate      200 Benzodiazepine   557 Tricyclics       322 Opiates          300 Cocaine          300 THC              50   CK     Status: Abnormal   Collection Time: 04/09/14  3:29 PM  Result Value Ref Range   Total CK 4548 (H) 7 - 177 U/L  Comprehensive metabolic panel     Status: Abnormal   Collection Time: 04/09/14  8:46 PM  Result Value Ref Range   Sodium 141 137 - 147 mEq/L   Potassium 3.8 3.7 - 5.3 mEq/L   Chloride 103 96 - 112 mEq/L   CO2 27 19 - 32 mEq/L   Glucose, Bld 99 70 - 99 mg/dL   BUN 14 6 - 23 mg/dL   Creatinine, Ser 0.83 0.50 - 1.10 mg/dL   Calcium 8.8 8.4 - 10.5 mg/dL   Total Protein 7.0 6.0 - 8.3 g/dL   Albumin 3.4 (L) 3.5 - 5.2 g/dL   AST 127 (H) 0 - 37 U/L   ALT 57 (H) 0 - 35 U/L   Alkaline Phosphatase 106 39 - 117 U/L   Total Bilirubin 0.3 0.3 - 1.2 mg/dL   GFR calc non Af Amer 85 (L) >90 mL/min   GFR calc Af Amer >90 >90 mL/min    Comment: (NOTE) The  eGFR has been calculated using the CKD EPI equation. This calculation has not been validated in all clinical situations. eGFR's persistently <90 mL/min signify possible Chronic Kidney Disease.    Anion gap 11 5 - 15  CK     Status: Abnormal   Collection Time: 04/09/14  8:46 PM  Result Value Ref Range   Total CK 5891 (H) 7 - 177 U/L  CK     Status: Abnormal   Collection Time: 04/10/14  5:50 AM  Result Value Ref Range   Total CK 5143 (H) 7 - 177 U/L  Comprehensive metabolic panel     Status: Abnormal   Collection Time: 04/10/14  5:50 AM  Result Value Ref Range   Sodium 146 137 - 147 mEq/L   Potassium 3.5 (L) 3.7 - 5.3 mEq/L   Chloride 109 96 - 112 mEq/L   CO2 25 19 - 32 mEq/L   Glucose, Bld 81 70 - 99 mg/dL   BUN 12 6 - 23 mg/dL   Creatinine, Ser 0.76 0.50 - 1.10 mg/dL   Calcium 8.3 (L) 8.4 - 10.5 mg/dL   Total Protein 6.5 6.0 - 8.3 g/dL   Albumin 3.1 (L) 3.5 - 5.2 g/dL   AST 132 (H) 0 - 37 U/L   ALT 60 (H) 0 - 35 U/L   Alkaline  Phosphatase 95 39 - 117 U/L   Total Bilirubin 0.3 0.3 - 1.2 mg/dL   GFR calc non Af Amer >90 >90 mL/min   GFR calc Af Amer >90 >90 mL/min    Comment: (NOTE) The eGFR has been calculated using the CKD EPI equation. This calculation has not been validated in all clinical situations. eGFR's persistently <90 mL/min signify possible Chronic Kidney Disease.    Anion gap 12 5 - 15  CBC     Status: Abnormal   Collection Time: 04/10/14  5:50 AM  Result Value Ref Range   WBC 8.6 4.0 - 10.5 K/uL   RBC 4.01 3.87 - 5.11 MIL/uL   Hemoglobin 11.4 (L) 12.0 - 15.0 g/dL   HCT 35.8 (L) 36.0 - 46.0 %   MCV 89.3 78.0 - 100.0 fL   MCH 28.4 26.0 - 34.0 pg   MCHC 31.8 30.0 - 36.0 g/dL   RDW 13.8 11.5 - 15.5 %   Platelets 175 150 - 400 K/uL    Comment: SPECIMEN CHECKED FOR CLOTS REPEATED TO VERIFY DELTA CHECK NOTED PLATELET COUNT CONFIRMED BY SMEAR   Protime-INR     Status: None   Collection Time: 04/10/14  5:50 AM  Result Value Ref Range   Prothrombin Time 14.6 11.6 - 15.2 seconds   INR 1.13 0.00 - 1.49  Hepatitis panel, acute     Status: None   Collection Time: 04/10/14  5:53 AM  Result Value Ref Range   Hepatitis B Surface Ag NEGATIVE NEGATIVE   HCV Ab NEGATIVE NEGATIVE   Hep A IgM NON REACTIVE NON REACTIVE    Comment: (NOTE) Effective March 09, 2014, Hepatitis Acute Panel (test code [phone]) will be revised to automatically reflex to the Hepatitis C Viral RNA, Quantitative, Real-Time PCR assay if the Hepatitis C antibody screening result is Reactive. This action is being taken to ensure that the CDC/USPSTF recommended HCV diagnostic algorithm with the appropriate test reflex needed for accurate interpretation is followed.    Hep B C IgM NON REACTIVE NON REACTIVE    Comment: (NOTE) High levels of Hepatitis B Core IgM antibody are detectable during the acute stage  of Hepatitis B. This antibody is used to differentiate current from past HBV infection. Performed at [facility]    HIV antibody     Status: None   Collection Time: 04/10/14  5:53 AM  Result Value Ref Range   HIV 1&2 Ab, 4th Generation NONREACTIVE NONREACTIVE    Comment: (NOTE) A NONREACTIVE HIV Ag/Ab result does not exclude HIV infection since the time frame for seroconversion is variable. If acute HIV infection is suspected, a HIV-1 RNA Qualitative TMA test is recommended. HIV-1/2 Antibody Diff         Not indicated. HIV-1 RNA, Qual TMA           Not indicated. PLEASE NOTE: This information has been disclosed to you from records whose confidentiality may be protected by state law. If your state requires such protection, then the state law prohibits you from making any further disclosure of the information without the specific written consent of the person to whom it pertains, or as otherwise permitted by law. A general authorization for the release of medical or other information is NOT sufficient for this purpose. The performance of this assay has not been clinically validated in patients less than 106 years old. Performed at [facility]   Glucose, capillary     Status: None   Collection Time: 04/10/14  8:05 AM  Result Value Ref Range   Glucose-Capillary 72 70 - 99 mg/dL   Labs are reviewed and are pertinent for  Leukocytosis, elevated LFTs, Elevated CPK  Current Facility-Administered Medications  Medication Dose Route Frequency Provider Last Rate Last Dose  . 0.9 %  sodium chloride infusion   Intravenous Continuous Robbie Lis, MD 50 mL/hr at 04/10/14 1101    . albuterol (PROVENTIL) (2.5 MG/3ML) 0.083% nebulizer solution 3 mL  3 mL Inhalation Q6H PRN Ivor Costa, MD      . aspirin-acetaminophen-caffeine [facility] MIGRAINE) per tablet 2 tablet  2 tablet Oral BID PRN Robbie Lis, MD   2 tablet at 04/10/14 1236  . FLUoxetine (PROZAC) capsule 60 mg  60 mg Oral Daily Robbie Lis, MD   60 mg at 04/10/14 1236  . gabapentin (NEURONTIN) capsule 600 mg  600 mg Oral TID Robbie Lis, MD    600 mg at 04/10/14 1236  . heparin injection 5,000 Units  5,000 Units Subcutaneous 3 times per day Ivor Costa, MD   5,000 Units at 04/10/14 0606  . multivitamin with minerals tablet 1 tablet  1 tablet Oral q morning - 10a Ivor Costa, MD   1 tablet at 04/10/14 1100  . nicotine (NICODERM CQ - dosed in mg/24 hours) patch 21 mg  21 mg Transdermal Once Quintella Reichert, MD   21 mg at 04/09/14 1800  . nicotine (NICODERM CQ - dosed in mg/24 hours) patch 21 mg  21 mg Transdermal Once Ivor Costa, MD   21 mg at 04/10/14 0016  . pantoprazole (PROTONIX) EC tablet 40 mg  40 mg Oral Daily Ivor Costa, MD   40 mg at 04/10/14 1100    Psychiatric Specialty Exam: ROS- headache, no SOB, no Chest Pain, no abdominal pain, no diarrhea, no Dysuria/Urgency, no peripheral edema, no rash.  *At this time not endorsing opiate withdrawal symptoms  Blood pressure 118/61, pulse 77, temperature 98.6 F (37 C), temperature source Oral, resp. rate 20, height '5\' 2"'  (1.575 m), weight 85.911 kg (189 lb 6.4 oz), SpO2 95 %.Body mass index is 34.63 kg/(m^2).  General Appearance: Fairly Groomed  Eye Contact::  Good  Speech:  Normal Rate  Volume:  Decreased  Mood:  Depressed  Affect:  Constricted and Tearful  Thought Process:  Linear  Orientation:  Full (Time, Place, and Person)  Thought Content:  denies hallucinations, no delusions  Suicidal Thoughts:  No at this time denies plan or intention to hurt self   Homicidal Thoughts:  No  Memory:  Recent and Remote grossly intact  Judgement:  Fair  Insight:  Fair  Psychomotor Activity:  Decreased  Concentration:  Good  Recall:  Good  Fund of Knowledge:Good  Language: Good  Akathisia:  Negative  Handed:  Right  AIMS (if indicated):     Assets:  Desire for Improvement Resilience  Sleep:      Musculoskeletal: Strength & Muscle Tone: within normal limits Gait & Station: normal Patient leans: N/A  Treatment Plan Summary: 1. At this time , based on her history of depression and  suicidal attempts, recent serious suicide attempt, and current presentation ( depressed, tearful) I do think patient requires psychiatric admission once medically cleared. At this time patient is agreeing to this and would consider signing voluntary admission. 2. Continue Prozac 60 mgrs QDAY 3. Of note, patient wants to continue methadone maintenance  And states she is not interested in tapering off /detoxing off opiates at this time. Based on recent overdose on methadone would not restart methadone yet, but would monitor for onset of opiate withdrawal symptoms and then reconsider opiate management options .   Neita Garnet 04/10/2014 3:33 PM

## 2014-04-10 NOTE — Progress Notes (Signed)
Pt states it is okay to give her Mom, Kathreen DevoidBetsy McNeil information regarding her care.

## 2014-04-10 NOTE — Progress Notes (Signed)
UR completed 

## 2014-04-10 NOTE — Progress Notes (Signed)
Clinical Social Work Department CLINICAL SOCIAL WORK PSYCHIATRY SERVICE LINE ASSESSMENT 04/10/2014  Patient:  Mary Ewing  Account:  [id number]  [place] Date:  04/09/2014  Clinical Social Worker:  Sindy Messing, LCSW  Date/Time:  04/10/2014 11:45 AM Referred by:  Physician  Date referred:  04/10/2014 Reason for Referral  Psychosocial assessment   Presenting Symptoms/Problems (In the person's/family's own words):   Psych consulted due to overdose.   Abuse/Neglect/Trauma History (check all that apply)  Denies history   Abuse/Neglect/Trauma Comments:   Psychiatric History (check all that apply)  Outpatient treatment  Inpatient/hospitilization   Psychiatric medications:  Prozac 60 mg   Current Mental Health Hospitalizations/Previous Mental Health History:   Patient reports she was diagnosed with depression in 2005 or 2006. Patient reports she had an abortion which triggered depression. Patient currently has medication management and outpatient counseling.   Current provider:   Jodene Nam and Date:   [employer], [place]   Current Medications:   Scheduled Meds:      . FLUoxetine  60 mg Oral Daily  . gabapentin  600 mg Oral TID  . heparin  5,000 Units Subcutaneous 3 times per day  . multivitamin with minerals  1 tablet Oral q morning - 10a  . nicotine  21 mg Transdermal Once  . nicotine  21 mg Transdermal Once  . pantoprazole  40 mg Oral Daily  . potassium chloride  20 mEq Oral Once        Continuous Infusions:      . sodium chloride 50 mL/hr at 04/10/14 1101          PRN Meds:.albuterol, aspirin-acetaminophen-caffeine       Previous Impatient Admission/Date/Reason:   Patient reports she was at [facility] 2012.   Emotional Health / Current Symptoms    Suicide/Self Harm  Suicide attempt in past (date/description)   Suicide attempt in the past:   Patient reports she attempted to slit her wrists in 2006. Patient reports that she also tried to overdose on 2012. Patient  admitted after overdosing on medication as well.   Other harmful behavior:   None reported   Psychotic/Dissociative Symptoms  None reported   Other Psychotic/Dissociative Symptoms:    Attention/Behavioral Symptoms  Within Normal Limits   Other Attention / Behavioral Symptoms:   Patient engaged during assessment.    Cognitive Impairment  Within Normal Limits   Other Cognitive Impairment:   Patient alert and oriented.    Mood and Adjustment  Flat    Stress, Anxiety, Trauma, Any Recent Loss/Stressor  Relationship  Grief/Loss (recent or history)   Anxiety (frequency):   N/A   Phobia (specify):   N/A   Compulsive behavior (specify):   N/A   Obsessive behavior (specify):   N/A   Other:   Patient reports father passed away about 1 year ago. Patient and mother do not get along well but live together.   Substance Abuse/Use  None   SBIRT completed (please refer for detailed history):  N  Self-reported substance use:   Patient denies any substance use.   Urinary Drug Screen Completed:  Y Alcohol level:   <11    Environmental/Housing/Living Arrangement  Stable housing   Who is in the home:   Mom   Emergency contact:  Betsy-mom   Financial  Medicaid   Patient's Strengths and Goals (patient's own words):   Patient reports she has already applied for disability. Patient has stable housing with mom.   Clinical Social Worker's Interpretive Summary:  CSW received referral in order to assist with completing psychosocial assessment. CSW reviewed chart and met with patient at bedside. CSW introduced myself and explained role.    Patient reports she lives at home with mom. 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 Patient has a 67 year old but father has custody of the child most of the time. Patient confirmed that child was not present when she overdosed.    Patient reports she was first diagnosed with  depression in 2006 following an abortion. Patient reports that she has attempted suicide 3 times in the past and that she was upset after arguing with mother. Patient reports she is unsure the amount of pills she took and that it was an impulsive action. Patient reports she is not feeling suicidal today but continues to feel depressed. Patient denies any SI or HI.    Patient has flat affect but engaged during assessment. Patient has been to inpatient psych placement in the past and reports she understands the process of being evaluated to determine disposition plans.    CSW will assist with any recommendations provided by psych MD.   Disposition:  Recommend Psych CSW continuing to support while in hospital   [place], [place] [phone]

## 2014-04-10 NOTE — Progress Notes (Signed)
CARE MANAGEMENT NOTE 04/10/2014  Patient:  Mary Ewing,Mary Ewing   Account Number:  [id number]  Date Initiated:  04/10/2014  Documentation initiated by:  Ferdinand CavaSCHETTINO,Tyress Loden  Subjective/Objective Assessment:   43 yo female admitted with OD     Action/Plan:   discharge planning   Anticipated DC Date:  04/11/2014   Anticipated DC Plan:  PSYCHIATRIC HOSPITAL  In-house referral  Clinical Social Worker      DC Planning Services  CM consult      Choice offered to / List presented to:             Status of service:  Completed, signed off Medicare Important Message given?   (If response is "NO", the following Medicare IM given date fields will be blank) Date Medicare IM given:   Medicare IM given by:   Date Additional Medicare IM given:   Additional Medicare IM given by:    Discharge Disposition:    Per UR Regulation:    If discussed at Long Length of Stay Meetings, dates discussed:    Comments:  04/10/14 Ferdinand CavaAndrea Schettino RN BSN CM [phone] Patient stated that she just recently received Medicaid and [place] [facility] listed on the card as her PCP but she has not had time to schedule an appointment.  04/10/14 Ferdinand CavaAndrea Schettino RN BSN CM [phone] Provided patient with resources for uninsured and explained that there are not assistance programs for narcotics. Will continue to follow for any needs

## 2014-04-10 NOTE — Progress Notes (Signed)
Patient ID: Mary Ewing, female   DOB: 01-26-1971, 43 y.o.   MRN: [id number]  [facility] PROGRESS NOTE  Mary Ewing WUJ:[id number] DOB: 01-26-1971 DOA: 04/09/2014 PCP: Evette GeorgesDD,JEFFREY ALLEN, MD  Brief narrative:    43 y.o. female with past medical history of depression, anxiety, GERD, asthma, who [facility].Per report review, pt apparently overdosed on 500 mg of methadone at around 4 PM yesterday, after she was yelled by her mother because of spending too long time on shopping holiday gift. She also took 2 pills of her boyfriend's Adderall, and 2 pills of Klonopin. After that, she felt sleepy, and had poor balance. She did not have fall or injury to herself. She did not have vision changes, hearing loss, hallucination, chest pain, shortness of breath, unilateral weakness. She denied fever, chills, headaches, cough, chest pain, SOB, abdominal pain, diarrhea, constipation, dysuria, urgency, frequency, hematuria, skin rashes, joint pain or leg swelling. She was brought to ED by her mother.   Work up in the ED demonstrated CK 4548, repeated CK up to 5891; negative urinalysis; positive UDS with amphetamine and benzo; leukocytosis with WBC 14.5; elevated AST 127 and ALT 57; creatinine 0.83.  Assessment/Plan:    Principal Problem:   Overdose - more clinically stable this AM but still drowsy - psych consult requested  Active Problems:   TOBACCO ABUSE - may provide nicotine patch if pt asks for it    QT prolongation - stable this AM, continue to monitor on telemetry    Elevated transaminase level - repeat CMET in AM   Rhabdomyolysis - from the OD on substances outline in HPI - continue IVF and repeat CK level in AM   Hypokalemia - supplement and repeat BMP in AM   Leukocytosis - likely reactive, WBC now WNL   DVT Prophylaxis: Heparin SQ  Code Status: Full.  Family Communication:  plan of care discussed with the patient Disposition Plan: Home when stable.    IV access:   Peripheral IV Procedures and diagnostic studies:     None Medical Consultants:   Psych Other Consultants:   None IAnti-Infectives:    None   Manson PasseyEVINE, ALMA, MD  [facility] Pager [phone]  If 7PM-7AM, please contact night-coverage [email or web address] Password [email or web address] 04/10/2014, 10:45 AM   LOS: 1 day    HPI/Subjective: No acute overnight events.  Objective: Filed Vitals:   04/09/14 2230 04/09/14 2315 04/10/14 0601 04/10/14 1027  BP: 113/69 108/61 115/58 128/74  Pulse: 76  71 73  Temp:   98.3 F (36.8 C) 98.4 F (36.9 C)  TempSrc:   Oral Oral  Resp:   16 20  SpO2: 100%  98% 100%    Intake/Output Summary (Last 24 hours) at 04/10/14 1045 Last data filed at 04/10/14 1041  Gross per 24 hour  Intake   1695 ml  Output      0 ml  Net   1695 ml    Exam:   General:  Pt is drowsy, NAD  Cardiovascular: Regular rate and rhythm, S1/S2, no murmurs  Respiratory: Clear to auscultation bilaterally, no wheezing, no crackles, no rhonchi  Abdomen: Soft, non tender, non distended, bowel sounds present  Extremities: No edema, pulses DP and PT palpable bilaterally  Neuro: Grossly nonfocal  Data Reviewed: Basic Metabolic Panel:  Recent Labs Lab 04/09/14 1156 04/09/14 2046 04/10/14 0550  NA 137 141 146  K 3.5* 3.8 3.5*  CL 98 103 109  CO2 26 27 25   GLUCOSE 119*  99 81  BUN 17 14 12   CREATININE 0.71 0.83 0.76  CALCIUM 9.3 8.8 8.3*   Liver Function Tests:  Recent Labs Lab 04/09/14 1156 04/09/14 2046 04/10/14 0550  AST 101* 127* 132*  ALT 56* 57* 60*  ALKPHOS 123* 106 95  BILITOT 0.4 0.3 0.3  PROT 8.0 7.0 6.5  ALBUMIN 4.1 3.4* 3.1*   CBC:  Recent Labs Lab 04/09/14 1156 04/10/14 0550  WBC 14.5* 8.6  NEUTROABS 11.5*  --   HGB 12.8 11.4*  HCT 40.1 35.8*  MCV 89.1 89.3  PLT 255 175   Cardiac Enzymes:  Recent Labs Lab 04/09/14 1529 04/09/14 2046 04/10/14 0550  CKTOTAL 4548* 5891* 5143*   BNP: Invalid input(s):  POCBNP CBG:  Recent Labs Lab 04/10/14 0805  GLUCAP 72    Scheduled Meds: . heparin  5,000 Units Subcutaneous 3 times per day  . multivitamin with minerals  1 tablet Oral q morning - 10a  . nicotine  21 mg Transdermal Once  . nicotine  21 mg Transdermal Once  . pantoprazole  40 mg Oral Daily   Continuous Infusions: . sodium chloride 150 mL/hr at 04/10/14 0755

## 2014-04-11 DIAGNOSIS — T50902A Poisoning by unspecified drugs, medicaments and biological substances, intentional self-harm, initial encounter: Secondary | ICD-10-CM

## 2014-04-11 DIAGNOSIS — F411 Generalized anxiety disorder: Secondary | ICD-10-CM

## 2014-04-11 LAB — CBC
HCT: 37.1 % (ref 36.0–46.0)
Hemoglobin: 11.6 g/dL — ABNORMAL LOW (ref 12.0–15.0)
MCH: 28.6 pg (ref 26.0–34.0)
MCHC: 31.3 g/dL (ref 30.0–36.0)
MCV: 91.4 fL (ref 78.0–100.0)
PLATELETS: 201 10*3/uL (ref 150–400)
RBC: 4.06 MIL/uL (ref 3.87–5.11)
RDW: 13.9 % (ref 11.5–15.5)
WBC: 6.4 10*3/uL (ref 4.0–10.5)

## 2014-04-11 LAB — COMPREHENSIVE METABOLIC PANEL
ALT: 65 U/L — ABNORMAL HIGH (ref 0–35)
AST: 131 U/L — AB (ref 0–37)
Albumin: 3.1 g/dL — ABNORMAL LOW (ref 3.5–5.2)
Alkaline Phosphatase: 96 U/L (ref 39–117)
Anion gap: 10 (ref 5–15)
BUN: 9 mg/dL (ref 6–23)
CALCIUM: 8.3 mg/dL — AB (ref 8.4–10.5)
CO2: 26 meq/L (ref 19–32)
CREATININE: 0.76 mg/dL (ref 0.50–1.10)
Chloride: 106 mEq/L (ref 96–112)
GFR calc non Af Amer: >90 mL/min (ref >90)
Glucose, Bld: 85 mg/dL (ref 70–99)
Potassium: 3.6 mEq/L — ABNORMAL LOW (ref 3.7–5.3)
SODIUM: 142 meq/L (ref 137–147)
Total Bilirubin: 0.2 mg/dL — ABNORMAL LOW (ref 0.3–1.2)
Total Protein: 6.3 g/dL (ref 6.0–8.3)

## 2014-04-11 LAB — CK TOTAL AND CKMB (NOT AT ARMC)
CK TOTAL: 3073 U/L — AB (ref 7–177)
CK, MB: 18.4 ng/mL (ref 0.3–4.0)
RELATIVE INDEX: 0.6 (ref 0.0–2.5)

## 2014-04-11 MED ORDER — NICOTINE 21 MG/24HR TD PT24
21.0000 mg | MEDICATED_PATCH | Freq: Every day | TRANSDERMAL | Status: DC
  Administered 2014-04-11 – 2014-04-13 (×3): 21 mg via TRANSDERMAL
  Filled 2014-04-11 (×3): qty 1

## 2014-04-11 MED ORDER — IBUPROFEN 200 MG PO TABS
400.0000 mg | ORAL_TABLET | Freq: Four times a day (QID) | ORAL | Status: DC | PRN
  Administered 2014-04-11 – 2014-04-13 (×4): 400 mg via ORAL
  Filled 2014-04-11 (×4): qty 2

## 2014-04-11 MED ORDER — POTASSIUM CHLORIDE CRYS ER 20 MEQ PO TBCR
40.0000 meq | EXTENDED_RELEASE_TABLET | Freq: Once | ORAL | Status: AC
  Administered 2014-04-11: 40 meq via ORAL
  Filled 2014-04-11: qty 2

## 2014-04-11 NOTE — Progress Notes (Signed)
CRITICAL VALUE ALERT  Critical value received:  ckmb 18.4  Date of notification:  12/19  Time of notification:  0732  Critical value read back:Yes.    Nurse who received alert:  Arjun Hard rn  MD notified (1st page):  IIzola [email or web address]. myers  Time of first page:  [phone]  MD notified (2nd page):  Time of second page:  Responding MD:    Time MD responded:

## 2014-04-11 NOTE — Progress Notes (Signed)
Patient ID: Mary Ewing, female   DOB: [birth year], 43 y.o.   MRN: [id number]  [facility] PROGRESS NOTE  Mary Ewing WUJ:[id number] DOB: [birth year] DOA: 04/09/2014 PCP: Evette GeorgesDD,JEFFREY ALLEN, MD  Brief narrative:    43 y.o. female with past medical history of depression, anxiety, GERD, asthma, who [facility].Per report review, pt apparently overdosed on 500 mg of methadone at around 4 PM yesterday, after she was yelled by her mother because of spending too long time on shopping holiday gift. She also took 2 pills of her boyfriend's Adderall, and 2 pills of Klonopin. After that, she felt sleepy, and had poor balance. She did not have fall or injury to herself. She did not have vision changes, hearing loss, hallucination, chest pain, shortness of breath, unilateral weakness. She denied fever, chills, headaches, cough, chest pain, SOB, abdominal pain, diarrhea, constipation, dysuria, urgency, frequency, hematuria, skin rashes, joint pain or leg swelling. She was brought to ED by her mother.   Work up in the ED demonstrated CK 4548, repeated CK up to 5891; negative urinalysis; positive UDS with amphetamine and benzo; leukocytosis with WBC 14.5; elevated AST 127 and ALT 57; creatinine 0.83.  Assessment/Plan:    Principal Problem:  Overdose - more clinically stable this AM but still drowsy - psych consult requested  Active Problems:  TOBACCO ABUSE - may provide nicotine patch if pt asks for it   QT prolongation - stable this AM, continue to monitor on telemetry   Elevated transaminase level - repeat CMET in AM  Rhabdomyolysis - from the OD on substances outline in HPI - continue IVF and repeat CK level in AM  Hypokalemia - supplement and repeat BMP in AM  Leukocytosis - likely reactive, WBC now WNL   DVT Prophylaxis: Heparin SQ  Code Status: Full.  Family Communication: plan of care discussed with the patient Disposition Plan: Home when  stable.   IV access:   Peripheral IV Procedures and diagnostic studies:    None Medical Consultants:   Psych Other Consultants:   None IAnti-Infectives:    None    Debbora PrestoMAGICK-MYERS, ISKRA, MD  [facility] Pager [phone]  If 7PM-7AM, please contact night-coverage [email or web address] Password [email or web address] 04/11/2014, 9:31 AM   LOS: 2 days   HPI/Subjective: No events overnight.   Objective: Filed Vitals:   04/10/14 1234 04/10/14 1513 04/10/14 2110 04/11/14 0529  BP:  118/61 125/65 124/55  Pulse:  77 64 61  Temp:  98.6 F (37 C) 98.1 F (36.7 C) 97.8 F (36.6 C)  TempSrc:  Oral Oral Oral  Resp:  20 18 18   Height: 5\' 2"  (1.575 m)     Weight: 85.911 kg (189 lb 6.4 oz)     SpO2:  95% 98% 99%    Intake/Output Summary (Last 24 hours) at 04/11/14 0931 Last data filed at 04/11/14 0754  Gross per 24 hour  Intake    600 ml  Output      0 ml  Net    600 ml    Exam:   General:  Pt is alert, follows commands appropriately, not in acute distress  Cardiovascular: Regular rate and rhythm, S1/S2, no murmurs, no rubs, no gallops  Respiratory: Clear to auscultation bilaterally, no wheezing, no crackles, no rhonchi  Abdomen: Soft, non tender, non distended, bowel sounds present, no guarding  Extremities: No edema, pulses DP and PT palpable bilaterally  Neuro: Grossly nonfocal, still with tremors at rest and with exertion   Data Reviewed:  Basic Metabolic Panel:  Recent Labs Lab 04/09/14 1156 04/09/14 2046 04/10/14 0550 04/11/14 0525  NA 137 141 146 142  K 3.5* 3.8 3.5* 3.6*  CL 98 103 109 106  CO2 26 27 25 26   GLUCOSE 119* 99 81 85  BUN 17 14 12 9   CREATININE 0.71 0.83 0.76 0.76  CALCIUM 9.3 8.8 8.3* 8.3*   Liver Function Tests:  Recent Labs Lab 04/09/14 1156 04/09/14 2046 04/10/14 0550 04/11/14 0525  AST 101* 127* 132* 131*  ALT 56* 57* 60* 65*  ALKPHOS 123* 106 95 96  BILITOT 0.4 0.3 0.3 0.2*  PROT 8.0 7.0 6.5 6.3  ALBUMIN 4.1 3.4* 3.1* 3.1*    CBC:  Recent Labs Lab 04/09/14 1156 04/10/14 0550 04/11/14 0525  WBC 14.5* 8.6 6.4  NEUTROABS 11.5*  --   --   HGB 12.8 11.4* 11.6*  HCT 40.1 35.8* 37.1  MCV 89.1 89.3 91.4  PLT 255 175 201   Cardiac Enzymes:  Recent Labs Lab 04/09/14 1529 04/09/14 2046 04/10/14 0550 04/11/14 0525  CKTOTAL 4548* 5891* 5143* 3073*  CKMB  --   --   --  18.4*   BNP: Invalid input(s): POCBNP CBG:  Recent Labs Lab 04/10/14 0805  GLUCAP 72    Scheduled Meds: . FLUoxetine  60 mg Oral Daily  . gabapentin  600 mg Oral TID  . heparin  5,000 Units Subcutaneous 3 times per day  . multivitamin with minerals  1 tablet Oral q morning - 10a  . nicotine  21 mg Transdermal Daily  . pantoprazole  40 mg Oral Daily   Continuous Infusions: . sodium chloride 50 mL/hr at 04/10/14 1101

## 2014-04-12 LAB — COMPREHENSIVE METABOLIC PANEL
ALBUMIN: 3.2 g/dL — AB (ref 3.5–5.2)
ALK PHOS: 99 U/L (ref 39–117)
ALT: 65 U/L — AB (ref 0–35)
AST: 117 U/L — ABNORMAL HIGH (ref 0–37)
Anion gap: 13 (ref 5–15)
BILIRUBIN TOTAL: 0.3 mg/dL (ref 0.3–1.2)
BUN: 6 mg/dL (ref 6–23)
CO2: 24 mEq/L (ref 19–32)
Calcium: 8.7 mg/dL (ref 8.4–10.5)
Chloride: 104 mEq/L (ref 96–112)
Creatinine, Ser: 0.73 mg/dL (ref 0.50–1.10)
GFR calc Af Amer: >90 mL/min (ref >90)
GFR calc non Af Amer: >90 mL/min (ref >90)
GLUCOSE: 86 mg/dL (ref 70–99)
Potassium: 3.9 mEq/L (ref 3.7–5.3)
SODIUM: 141 meq/L (ref 137–147)
TOTAL PROTEIN: 6.6 g/dL (ref 6.0–8.3)

## 2014-04-12 LAB — GLUCOSE, CAPILLARY: Glucose-Capillary: 84 mg/dL (ref 70–99)

## 2014-04-12 LAB — CBC
HCT: 37.5 % (ref 36.0–46.0)
HEMOGLOBIN: 11.7 g/dL — AB (ref 12.0–15.0)
MCH: 27.9 pg (ref 26.0–34.0)
MCHC: 31.2 g/dL (ref 30.0–36.0)
MCV: 89.5 fL (ref 78.0–100.0)
Platelets: 197 10*3/uL (ref 150–400)
RBC: 4.19 MIL/uL (ref 3.87–5.11)
RDW: 13.7 % (ref 11.5–15.5)
WBC: 6.4 10*3/uL (ref 4.0–10.5)

## 2014-04-12 LAB — CK TOTAL AND CKMB (NOT AT ARMC)
CK, MB: 10.6 ng/mL (ref 0.3–4.0)
Relative Index: 0.5 (ref 0.0–2.5)
Total CK: 2290 U/L — ABNORMAL HIGH (ref 7–177)

## 2014-04-12 MED ORDER — METHADONE HCL 10 MG/ML PO CONC
100.0000 mg | Freq: Every day | ORAL | Status: DC
  Administered 2014-04-12 – 2014-04-13 (×2): 100 mg via ORAL
  Filled 2014-04-12 (×2): qty 10

## 2014-04-12 NOTE — Progress Notes (Signed)
Patient ID: Mary Ewing L Witter, female   DOB: June 04, 1970, 43 y.o.   MRN: [id number]  [facility] PROGRESS NOTE  Mary Ewing L Gabrielle WUJ:[id number] DOB: June 04, 1970 DOA: 04/09/2014 PCP: Evette GeorgesDD,JEFFREY ALLEN, MD  Brief narrative:    43 y.o. female with past medical history of depression, anxiety, GERD, asthma, who [facility].Per report review, pt apparently overdosed on 500 mg of methadone at around 4 PM yesterday, after she was yelled by her mother because of spending too long time on shopping holiday gift. She also took 2 pills of her boyfriend's Adderall, and 2 pills of Klonopin. After that, she felt sleepy, and had poor balance. She did not have fall or injury to herself. She did not have vision changes, hearing loss, hallucination, chest pain, shortness of breath, unilateral weakness. She denied fever, chills, headaches, cough, chest pain, SOB, abdominal pain, diarrhea, constipation, dysuria, urgency, frequency, hematuria, skin rashes, joint pain or leg swelling. She was brought to ED by her mother.   Work up in the ED demonstrated CK 4548, repeated CK up to 5891; negative urinalysis; positive UDS with amphetamine and benzo; leukocytosis with WBC 14.5; elevated AST 127 and ALT 57; creatinine 0.83.  Assessment/Plan:    Principal Problem:  Overdose - more clinically stable this AM but still drowsy - psych consult appreciated  Active Problems:  TOBACCO ABUSE - may provide nicotine patch if pt asks for it   QT prolongation - stable this AM, no chest pain - d/c telemetry   Elevated transaminase level - LFT's trending down   Rhabdomyolysis - from the OD on substances outline in HPI - continue IVF and repeat CK level in AM  Hypokalemia - supplemented and WNL this AM  Leukocytosis - likely reactive, WBC now WNL   DVT Prophylaxis: Heparin SQ  Code Status: Full.  Family Communication: plan of care discussed with the patient [facility] be  [facility]  IV access:   Peripheral IV Procedures and diagnostic studies:    None Medical Consultants:   Psych Other Consultants:   None IAnti-Infectives:    None  Debbora PrestoMAGICK-MYERS, ISKRA, MD  [facility] Pager [phone]  If 7PM-7AM, please contact night-coverage [email or web address] Password [email or web address] 04/12/2014, 10:29 AM   LOS: 3 days   HPI/Subjective: No events overnight.   Objective: Filed Vitals:   04/11/14 0529 04/11/14 1503 04/11/14 2107 04/12/14 0621  BP: 124/55 142/87 123/52 142/69  Pulse: 61 61 56 55  Temp: 97.8 F (36.6 C) 99.2 F (37.3 C) 98.5 F (36.9 C) 98.2 F (36.8 C)  TempSrc: Oral Oral Oral Oral  Resp: 18 20 18 18   Height:      Weight:      SpO2: 99% 99% 97% 96%    Intake/Output Summary (Last 24 hours) at 04/12/14 1029 Last data filed at 04/11/14 1825  Gross per 24 hour  Intake 2530.42 ml  Output      0 ml  Net 2530.42 ml    Exam:   General:  Pt is alert, follows commands appropriately, not in acute distress  Cardiovascular: Regular rate and rhythm, S1/S2, no murmurs, no rubs, no gallops  Respiratory: Clear to auscultation bilaterally, no wheezing, no crackles, no rhonchi  Abdomen: Soft, non tender, non distended, bowel sounds present, no guarding  Extremities: No edema, pulses DP and PT palpable bilaterally  Neuro: Grossly nonfocal  Data Reviewed: Basic Metabolic Panel:  Recent Labs Lab 04/09/14 1156 04/09/14 2046 04/10/14 0550 04/11/14 0525 04/12/14 0520  NA 137  141 146 142 141  K 3.5* 3.8 3.5* 3.6* 3.9  CL 98 103 109 106 104  CO2 26 27 25 26 24   GLUCOSE 119* 99 81 85 86  BUN 17 14 12 9 6   CREATININE 0.71 0.83 0.76 0.76 0.73  CALCIUM 9.3 8.8 8.3* 8.3* 8.7   Liver Function Tests:  Recent Labs Lab 04/09/14 1156 04/09/14 2046 04/10/14 0550 04/11/14 0525 04/12/14 0520  AST 101* 127* 132* 131* 117*  ALT 56* 57* 60* 65* 65*  ALKPHOS 123* 106 95 96 99  BILITOT 0.4 0.3 0.3 0.2* 0.3  PROT 8.0 7.0 6.5  6.3 6.6  ALBUMIN 4.1 3.4* 3.1* 3.1* 3.2*   No results for input(s): LIPASE, AMYLASE in the last 168 hours. No results for input(s): AMMONIA in the last 168 hours. CBC:  Recent Labs Lab 04/09/14 1156 04/10/14 0550 04/11/14 0525 04/12/14 0520  WBC 14.5* 8.6 6.4 6.4  NEUTROABS 11.5*  --   --   --   HGB 12.8 11.4* 11.6* 11.7*  HCT 40.1 35.8* 37.1 37.5  MCV 89.1 89.3 91.4 89.5  PLT 255 175 201 197   Cardiac Enzymes:  Recent Labs Lab 04/09/14 1529 04/09/14 2046 04/10/14 0550 04/11/14 0525 04/12/14 0520  CKTOTAL 4548* 5891* 5143* 3073* 2290*  CKMB  --   --   --  18.4* 10.6*   BNP: Invalid input(s): POCBNP CBG:  Recent Labs Lab 04/10/14 0805 04/12/14 0718  GLUCAP 72 84    Scheduled Meds: . FLUoxetine  60 mg Oral Daily  . gabapentin  600 mg Oral TID  . heparin  5,000 Units Subcutaneous 3 times per day  . multivitamin with minerals  1 tablet Oral q morning - 10a  . nicotine  21 mg Transdermal Daily  . pantoprazole  40 mg Oral Daily   Continuous Infusions: . sodium chloride 75 mL/hr at 04/12/14 0530

## 2014-04-12 NOTE — Progress Notes (Signed)
Pt requesting to restart her home dose of methadone. MD made aware. Telephone order given. Order placed. Vwilliams,rn.

## 2014-04-13 LAB — COMPREHENSIVE METABOLIC PANEL
ALT: 64 U/L — AB (ref 0–35)
AST: 107 U/L — AB (ref 0–37)
Albumin: 3.4 g/dL — ABNORMAL LOW (ref 3.5–5.2)
Alkaline Phosphatase: 107 U/L (ref 39–117)
Anion gap: 11 (ref 5–15)
BUN: 5 mg/dL — ABNORMAL LOW (ref 6–23)
CALCIUM: 8.9 mg/dL (ref 8.4–10.5)
CO2: 27 meq/L (ref 19–32)
Chloride: 103 mEq/L (ref 96–112)
Creatinine, Ser: 0.8 mg/dL (ref 0.50–1.10)
GFR calc Af Amer: >90 mL/min (ref >90)
GFR, EST NON AFRICAN AMERICAN: 89 mL/min — AB (ref >90)
Glucose, Bld: 97 mg/dL (ref 70–99)
POTASSIUM: 3.9 meq/L (ref 3.7–5.3)
SODIUM: 141 meq/L (ref 137–147)
Total Bilirubin: 0.4 mg/dL (ref 0.3–1.2)
Total Protein: 6.8 g/dL (ref 6.0–8.3)

## 2014-04-13 LAB — CK TOTAL AND CKMB (NOT AT ARMC)
CK, MB: 6.9 ng/mL (ref 0.3–4.0)
RELATIVE INDEX: 0.4 (ref 0.0–2.5)
Total CK: 1617 U/L — ABNORMAL HIGH (ref 7–177)

## 2014-04-13 LAB — GLUCOSE, CAPILLARY: Glucose-Capillary: 92 mg/dL (ref 70–99)

## 2014-04-13 NOTE — Progress Notes (Signed)
Clinical Social Work Progress Note PSYCHIATRY SERVICE LINE 04/13/2014  Patient:  Mary Ewing  Account:  [id number]  Admit Date:  04/09/2014  Clinical Social Worker:   , LCSW  Date/Time:  04/13/2014 12:15 PM  Review of Patient  Overall Medical Condition:   Attending MD reports that patient is medically stable but wants psych MD to re-evaluate to determine if patient is safe to DC home.   Participation Level:  Active  Participation Quality  Appropriate   Other Participation Quality:   Patient engaged throughout session.   Affect  Depressed   Cognitive  Appropriate   Reaction to Medications/Concerns:   Patient reports she does not want any adjustments made to her medications.   Modes of Intervention  Support  Solution-Focused   Summary of Progress/Plan at Discharge   CSW spoke with attending MD and bedside RN prior to meeting with patient. CSW went to room and met with patient at bedside. Patient laying in bed and watching TV when CSW arrived.    Patient reported she had been waiting to see psych MD because she had previously agreed to inpatient psych placement but has thought about her options and does not feel that inpatient placement would be beneficial. CSW explained that psych MD will make determination on disposition but asked patient to discuss plans with CSW.    Patient reports she remains tearful but has spoken to mom a couple of times on the phone. Patient reports living at home is stressful and there is often tension between her and mother but that overdosing last week was impulsive and she now regrets that decision. Patient spoke about triggers to argument and overdose and identifies that father passing away last year and affected her relationship with mother. Patient believes that mother is also depressed re: death and has never dealt with her emotions, which often led to arguments between the two of them. Patient reports she has no other living  arrangement options so she knows she has to return to mother's house. Patient and mother have spoken about attending therapy together in order to work on their communication and relationship. Patient reports that she knows she should have handled situation differently but denies any active SI or HI. Patient reports she has started a safety plan and sister is agreeable to be a contact person if patient becomes overwhelmed or feels in crisis. Sister is agreeable to come to pick up patient in order to provide a break from mother and to keep patient safe. Patient reports she also has crisis number for [facility] and would be agreeable to use Mobile Crisis number if needed.    Patient continues to report she wants to go home and spend Christmas with her family. Patient aware that psych MD will make decision. Patient reports she has been to inpatient psych in the past and understands the tools she needs to use but needs to ensure that she is being proactive and using positive coping skills. Patient thanked CSW for visit and agreeable to continued support throughout hospital stay.    CSW will continue to follow and will assist with any recommendations provided by psych MD.        , LCSW [phone] 

## 2014-04-13 NOTE — Discharge Summary (Signed)
Physician Discharge Summary  Mary Ewing XBJ:[id number] DOB: 08-21-1970 DOA: 04/09/2014  PCP: Evette GeorgesDD,JEFFREY ALLEN, MD  Admit date: 04/09/2014 Discharge date: 04/13/2014  Recommendations for Outpatient Follow-up:  1. Pt will need to follow up with PCP in 2-3 weeks post discharge 2. Please obtain BMP to evaluate electrolytes and kidney function 3. Please also check CBC to evaluate Hg and Hct levels 4. Pt advised to contract for safety if needed, verbalized understanding 5. Pt is not suicidal, wants to follow up with psych in an outpatient setting   Discharge Diagnoses:  Principal Problem:   Overdose Active Problems:   Anxiety state   TOBACCO ABUSE   Extrinsic asthma   QT prolongation   Elevated transaminase level   Rhabdomyolysis    Discharge Condition: Stable  Diet recommendation: Heart healthy diet discussed in details   History of present illness:  Brief narrative:    43 y.o. female with past medical history of depression, anxiety, GERD, asthma, who [facility].Per report review, pt apparently overdosed on 500 mg of methadone at around 4 PM yesterday, after she was yelled by her mother because of spending too long time on shopping holiday gift. She also took 2 pills of her boyfriend's Adderall, and 2 pills of Klonopin. After that, she felt sleepy, and had poor balance. She did not have fall or injury to herself. She did not have vision changes, hearing loss, hallucination, chest pain, shortness of breath, unilateral weakness. She denied fever, chills, headaches, cough, chest pain, SOB, abdominal pain, diarrhea, constipation, dysuria, urgency, frequency, hematuria, skin rashes, joint pain or leg swelling. She was brought to ED by her mother.   Work up in the ED demonstrated CK 4548, repeated CK up to 5891; negative urinalysis; positive UDS with amphetamine and benzo; leukocytosis with WBC 14.5; elevated AST 127 and ALT 57; creatinine  0.83.  Assessment/Plan:    Principal Problem:  Overdose - more clinically stable this AM, denies SI/HI - psych consult appreciated  - pt wants to go home - spend over 1 hours discussing suicidal ideations and how to help herself in the case - pt verbalized understanding and knows how to contract for safety  Active Problems:  TOBACCO ABUSE - consultation on cessation provided   QT prolongation - stable this AM, no chest pain - no events on telemetry   Elevated transaminase level - LFT's trending down   Rhabdomyolysis - from the OD on substances outline in HPI - trending down - pt is asymptomatic   Hypokalemia - supplemented and WNL this AM  Leukocytosis - likely reactive, WBC now WNL   Code Status: Full.  Family Communication: plan of care discussed with the patient Disposition Plan: home   IV access:   Peripheral IV Procedures and diagnostic studies:    None Medical Consultants:   Psych Other Consultants:   None IAnti-Infectives:    None  Discharge Exam: Filed Vitals:   04/13/14 1355  BP: 136/81  Pulse: 95  Temp: 99 F (37.2 C)  Resp: 18   Filed Vitals:   04/12/14 2129 04/13/14 0557 04/13/14 1352 04/13/14 1355  BP: 117/62 119/55 132/79 136/81  Pulse: 57 57 97 95  Temp: 98.7 F (37.1 C) 98.2 F (36.8 C) 98.4 F (36.9 C) 99 F (37.2 C)  TempSrc: Oral Oral Oral Oral  Resp: 16 16 16 18   Height:      Weight:      SpO2: 96% 98% 98% 100%    General: Pt  is alert, follows commands appropriately, not in acute distress Cardiovascular: Regular rate and rhythm, S1/S2 +, no murmurs, no rubs, no gallops Respiratory: Clear to auscultation bilaterally, no wheezing, no crackles, no rhonchi Abdominal: Soft, non tender, non distended, bowel sounds +, no guarding Extremities: no edema, no cyanosis, pulses palpable bilaterally DP and PT Neuro: Grossly nonfocal  Discharge Instructions  Discharge Instructions    Diet - low  sodium heart healthy    Complete by:  As directed      Increase activity slowly    Complete by:  As directed             Medication List    STOP taking these medications        azithromycin 250 MG tablet  Commonly known as:  ZITHROMAX     dimenhyDRINATE 50 MG tablet  Commonly known as:  DRAMAMINE      TAKE these medications        albuterol 90 MCG/ACT inhaler  Commonly known as:  PROVENTIL,VENTOLIN  Inhale 2 puffs into the lungs every 6 (six) hours as needed for wheezing.     FLUoxetine 20 MG tablet  Commonly known as:  PROZAC  Take 60 mg by mouth every morning.     gabapentin 300 MG capsule  Commonly known as:  NEURONTIN  Take 600 mg by mouth 3 (three) times daily.     meclizine 25 MG tablet  Commonly known as:  ANTIVERT  Take 25 mg by mouth 3 (three) times daily as needed for dizziness.     methadone 10 MG/ML solution  Commonly known as:  DOLOPHINE  Take 100 mg by mouth every morning. Supplied from [facility].     multivitamins ther. w/minerals Tabs tablet  Take 1 tablet by mouth every morning.     omeprazole 20 MG capsule  Commonly known as:  PRILOSEC  Take 20 mg by mouth daily.           Follow-up Information    Follow up with TODD,JEFFREY Freida Busman, MD.   Specialty:  Family Medicine   Contact information:   [address] [place] [address] [phone]       Follow up with Debbora Presto, MD.   Specialty:  Internal Medicine   Why:  As needed, If symptoms worsen   Contact information:   [address] [place] [address] [phone]        The results of significant diagnostics from this hospitalization (including imaging, microbiology, ancillary and laboratory) are listed below for reference.     Microbiology: No results found for this or any previous visit (from the past 240 hour(s)).   Labs: Basic Metabolic Panel:  Recent Labs Lab 04/09/14 2046 04/10/14 0550 04/11/14 0525 04/12/14 0520  04/13/14 0452  NA 141 146 142 141 141  K 3.8 3.5* 3.6* 3.9 3.9  CL 103 109 106 104 103  CO2 27 25 26 24 27   GLUCOSE 99 81 85 86 97  BUN 14 12 9 6  5*  CREATININE 0.83 0.76 0.76 0.73 0.80  CALCIUM 8.8 8.3* 8.3* 8.7 8.9   Liver Function Tests:  Recent Labs Lab 04/09/14 2046 04/10/14 0550 04/11/14 0525 04/12/14 0520 04/13/14 0452  AST 127* 132* 131* 117* 107*  ALT 57* 60* 65* 65* 64*  ALKPHOS 106 95 96 99 107  BILITOT 0.3 0.3 0.2* 0.3 0.4  PROT 7.0 6.5 6.3 6.6 6.8  ALBUMIN 3.4* 3.1* 3.1* 3.2* 3.4*   No results for input(s):  LIPASE, AMYLASE in the last 168 hours. No results for input(s): AMMONIA in the last 168 hours. CBC:  Recent Labs Lab 04/09/14 1156 04/10/14 0550 04/11/14 0525 04/12/14 0520  WBC 14.5* 8.6 6.4 6.4  NEUTROABS 11.5*  --   --   --   HGB 12.8 11.4* 11.6* 11.7*  HCT 40.1 35.8* 37.1 37.5  MCV 89.1 89.3 91.4 89.5  PLT 255 175 201 197   Cardiac Enzymes:  Recent Labs Lab 04/09/14 2046 04/10/14 0550 04/11/14 0525 04/12/14 0520 04/13/14 0452  CKTOTAL 5891* 5143* 3073* 2290* 1617*  CKMB  --   --  18.4* 10.6* 6.9*   BNP: BNP (last 3 results) No results for input(s): PROBNP in the last 8760 hours. CBG:  Recent Labs Lab 04/10/14 0805 04/12/14 0718 04/13/14 0752  GLUCAP 72 84 92     SIGNED: Time coordinating discharge: Over 30 minutes  Debbora PrestoMAGICK-MYERS, ISKRA, MD  [facility] 04/13/2014, 3:27 PM Pager [phone]  If 7PM-7AM, please contact night-coverage [email or web address] Password [email or web address]

## 2014-04-13 NOTE — Progress Notes (Signed)
Discharge instructions and medications reviewed with patient. Patient verbalizes understanding and has no questions at this time. Patient confirms she has all personal belongings in her possession.

## 2014-04-13 NOTE — Discharge Instructions (Signed)
Suicidal Feelings, How to Help Yourself °Everyone feels sad or unhappy at times, but depressing thoughts and feelings of hopelessness can lead to thoughts of suicide. It can seem as if life is too tough to handle. If you feel as though you have reached the point where suicide is the only answer, it is time to let someone know immediately.  °HOW TO COPE AND PREVENT SUICIDE °· Let family, friends, teachers, or counselors know. Get help. Try not to isolate yourself from those who care about you. Even though you may not feel sociable, talk with someone every day. It is best if it is face-to-face. Remember, they will want to help you. °· Eat a regularly spaced and well-balanced diet. °· Get plenty of rest. °· Avoid alcohol and drugs because they will only make you feel worse and may also lower your inhibitions. Remove them from the home. If you are thinking of taking an overdose of your prescribed medicines, give your medicines to someone who can give them to you one day at a time. If you are on antidepressants, let your caregiver know of your feelings so he or she can provide a safer medicine, if that is a concern. °· Remove weapons or poisons from your home. °· Try to stick to routines. Follow a schedule and remind yourself that you have to keep that schedule every day. °· Set some realistic goals and achieve them. Make a list and cross things off as you go. Accomplishments give a sense of worth. Wait until you are feeling better before doing things you find difficult or unpleasant to do. °· If you are able, try to start exercising. Even half-hour periods of exercise each day will make you feel better. Getting out in the sun or into nature helps you recover from depression faster. If you have a favorite place to walk, take advantage of that. °· Increase safe activities that have always given you pleasure. This may include playing your favorite music, reading a good book, painting a picture, or playing your favorite  instrument. Do whatever takes your mind off your depression. °· Keep your living space well-lighted. °GET HELP °Contact a suicide hotline, crisis center, or local suicide prevention center for help right away. Local centers may include a hospital, clinic, community service organization, social service provider, or health department. °· Call your local emergency services (911 in the [place]). °· Call a suicide hotline: °¨ 1-800-273-TALK (1-800-273-8255) in the [place]. °¨ 1-800-SUICIDE (1-800-784-2433) in the [place]. °¨ 1-888-628-9454 in the [place] for Spanish-speaking counselors. °¨ 1-800-799-4TTY (1-800-799-4889) in the [place] for TTY users. °· Visit the following websites for information and help: °¨ National Suicide Prevention Lifeline: www.suicidepreventionlifeline.org °¨ Hopeline: www.hopeline.com °¨ American Foundation for Suicide Prevention: www.afsp.org °· For lesbian, gay, bisexual, transgender, or questioning youth, contact The Trevor Project: °¨ 1-866-4-U-TREVOR (1-866-488-7386) in the [place]. °¨ www.thetrevorproject.org °· In [place], treatment resources are listed in each province with listings available under [employer] or similar titles. Another source for Crisis Centres by Province is located at http://www.suicideprevention.ca/in-crisis-now/find-a-crisis-centre-now/crisis-centres °Document Released: 10/15/2002 Document Revised: 07/03/2011 Document Reviewed: 08/05/2013 °ExitCare® Patient Information ©2015 [employer]. This information is not intended to replace advice given to you by your health care provider. Make sure you discuss any questions you have with your health care provider. ° °

## 2014-10-23 DIAGNOSIS — G8929 Other chronic pain: Secondary | ICD-10-CM | POA: Insufficient documentation

## 2014-10-23 DIAGNOSIS — M542 Cervicalgia: Secondary | ICD-10-CM

## 2014-10-23 DIAGNOSIS — E28319 Asymptomatic premature menopause: Secondary | ICD-10-CM | POA: Insufficient documentation

## 2014-10-23 DIAGNOSIS — F339 Major depressive disorder, recurrent, unspecified: Secondary | ICD-10-CM | POA: Insufficient documentation

## 2014-10-23 DIAGNOSIS — M25519 Pain in unspecified shoulder: Secondary | ICD-10-CM | POA: Insufficient documentation

## 2014-10-23 DIAGNOSIS — F1121 Opioid dependence, in remission: Secondary | ICD-10-CM | POA: Insufficient documentation

## 2014-11-10 DIAGNOSIS — E785 Hyperlipidemia, unspecified: Secondary | ICD-10-CM | POA: Insufficient documentation

## 2014-11-11 DIAGNOSIS — D72829 Elevated white blood cell count, unspecified: Secondary | ICD-10-CM | POA: Insufficient documentation

## 2014-11-11 DIAGNOSIS — L0292 Furuncle, unspecified: Secondary | ICD-10-CM | POA: Insufficient documentation

## 2015-01-26 DIAGNOSIS — M25559 Pain in unspecified hip: Secondary | ICD-10-CM | POA: Insufficient documentation

## 2015-01-29 ENCOUNTER — Ambulatory Visit: Payer: Medicaid Other | Admitting: Family Medicine

## 2015-02-01 ENCOUNTER — Ambulatory Visit: Payer: Medicaid Other | Admitting: Family Medicine

## 2015-02-03 ENCOUNTER — Ambulatory Visit: Payer: Self-pay | Admitting: Family Medicine

## 2015-02-03 ENCOUNTER — Ambulatory Visit (INDEPENDENT_AMBULATORY_CARE_PROVIDER_SITE_OTHER): Payer: Medicaid Other | Admitting: Family Medicine

## 2015-02-03 ENCOUNTER — Encounter: Payer: Self-pay | Admitting: Family Medicine

## 2015-02-03 VITALS — BP 126/78 | HR 88 | Ht 62.0 in | Wt 178.0 lb

## 2015-02-03 DIAGNOSIS — M542 Cervicalgia: Secondary | ICD-10-CM

## 2015-02-03 MED ORDER — PREDNISONE 10 MG PO TABS: ORAL_TABLET | ORAL | Status: DC

## 2015-02-03 NOTE — Patient Instructions (Signed)
You have trapezius, rhomboid muscle strains - these can occur in isolation or as the result of an irritated nerve. Prednisone 6 day dose pack to relieve irritation/inflammation. Meloxicam 15mg  daily with food for pain and inflammation - restart day AFTER finishing prednisone if prescribed this. Flexeril three times a day as needed for muscle spasms (can make you sleepy - if so do not drive while taking this). Simple range of motion exercises within limits of pain to prevent further stiffness. We will request physical therapy for your low back and this area for stretching, exercises, traction, modalities, home exercise program. Heat 15 minutes at a time 3-4 times a day to help with spasms. Watch head position when on computers, texting, when sleeping in bed - should in line with back to prevent further spasms. Consider home traction unit if you get benefit with this in physical therapy. If not improving we will consider an MRI. Follow up with me once we get approval for a visit for your low back.

## 2015-02-04 DIAGNOSIS — M542 Cervicalgia: Secondary | ICD-10-CM | POA: Insufficient documentation

## 2015-02-04 NOTE — Progress Notes (Signed)
PCP and referred by: Dr. Delbert Harness  Subjective:   HPI: Patient is a 44 y.o. female here for left shoulder pain.  Patient reports she's had posterior left shoulder pain for 8 years. Started when she had two separate injuries where she fell backwards with arm behind her to brace self, fell onto left side posteriorly. Pain level 5/10 at rest, 8/10 with certain movements. Pain located at and just medial to left scapula. Pain is sharp, throbbing. States she is unable to work due to the pain here and in low back. Had x-rays remotely that were negative. Did some home exercises and stretches without improvement. Had not sought care regularly for this because she did not have insurance. + night pain. Feels better to sleep on left side. Some localized numbness. No bowel/bladder dysfunction. No skin changes, fever, other complaints.  Past Medical History  Diagnosis Date  . GERD (gastroesophageal reflux disease)   . Asthmatic bronchitis     history of  . Anxiety   . Depression   . H/O: suicide attempt     x 3  . Acute renal failure [facility])     Current Outpatient Prescriptions on File Prior to Visit  Medication Sig Dispense Refill  . albuterol (PROVENTIL,VENTOLIN) 90 MCG/ACT inhaler Inhale 2 puffs into the lungs every 6 (six) hours as needed for wheezing. 17 g 12  . FLUoxetine (PROZAC) 20 MG tablet Take 60 mg by mouth every morning.     . gabapentin (NEURONTIN) 300 MG capsule Take 600 mg by mouth 3 (three) times daily.    . meclizine (ANTIVERT) 25 MG tablet Take 25 mg by mouth 3 (three) times daily as needed for dizziness.     . methadone (DOLOPHINE) 10 MG/ML solution Take 100 mg by mouth every morning. Supplied from [facility].    . Multiple Vitamins-Minerals (MULTIVITAMINS THER. W/MINERALS) TABS Take 1 tablet by mouth every morning.     Marland Kitchen omeprazole (PRILOSEC) 20 MG capsule Take 20 mg by mouth daily.       No current facility-administered medications on file prior to visit.    Past  Surgical History  Procedure Laterality Date  . Cesarean section    . Appendectomy  2004  . Right tympanoplasty  1999  . Tonsillectomy    . Refractive surgery    . Mouth surgery      teeth removed    Allergies  Allergen Reactions  . Sulfonamide Derivatives Hives    Social History   Social History  . Marital Status: Single    Spouse Name: N/A  . Number of Children: N/A  . Years of Education: N/A   Occupational History  . Not on file.   Social History Main Topics  . Smoking status: Current Every Day Smoker -- 1.00 packs/day for 24 years    Types: Cigarettes  . Smokeless tobacco: Never Used  . Alcohol Use: 0.0 oz/week    0 Standard drinks or equivalent per week     Comment: occ  . Drug Use: Yes     Comment: prescription drugs  . Sexual Activity: No   Other Topics Concern  . Not on file   Social History Narrative    No family history on file.  BP 126/78 mmHg  Pulse 88  Ht  (1.575 m)  Wt 178 lb (80.74 kg)  BMI 32.55 kg/m2  Review of Systems: See HPI above.    Objective:  Physical Exam:  Gen: NAD  Neck: No gross deformity, swelling, bruising.  TTP medial to left scapula.  No midline/bony TTP.  No other tenderness. FROM neck - pain on right lateral rotation, flexion mainly. BUE strength 5/5.   Sensation intact to light touch.   2+ equal reflexes in triceps, biceps, brachioradialis tendons. Negative spurlings. NV intact distal BUEs.  Left shoulder: No swelling, ecchymoses.  No gross deformity. No TTP. FROM. Negative Hawkins, Neers. Negative Speeds, Yergasons. Strength 5/5 with empty can and resisted internal/external rotation. Negative apprehension. NV intact distally.    Assessment & Plan:  1. Neck pain - patient's shoulder exam is benign.  Issue is more localized to rhomboids, trapezius muscle on the left side.  Has not tried much to date besides home exercises.  Will refer for physical therapy (she wants to include her low back in this as  well since she has medicaid which would only allow one PT visit).  Trial prednisone with transition to meloxicam.  Flexeril as needed.  Heat for spasms.  Discussed ergonomic issues.  F/u in 5-6 weeks.  2. Low back pain - advised patient we would get a referral to see her for this as well though initial treatment is likely similar to that for her neck.

## 2015-02-04 NOTE — Assessment & Plan Note (Signed)
patient's shoulder exam is benign.  Issue is more localized to rhomboids, trapezius muscle on the left side.  Has not tried much to date besides home exercises.  Will refer for physical therapy (she wants to include her low back in this as well since she has medicaid which would only allow one PT visit).  Trial prednisone with transition to meloxicam.  Flexeril as needed.  Heat for spasms.  Discussed ergonomic issues.  F/u in 5-6 weeks.

## 2015-03-23 ENCOUNTER — Ambulatory Visit: Payer: Self-pay | Admitting: Family Medicine

## 2015-03-25 ENCOUNTER — Encounter (INDEPENDENT_AMBULATORY_CARE_PROVIDER_SITE_OTHER): Payer: Self-pay

## 2015-03-25 ENCOUNTER — Encounter: Payer: Self-pay | Admitting: Family Medicine

## 2015-03-25 ENCOUNTER — Ambulatory Visit: Payer: Medicaid Other | Admitting: Family Medicine

## 2015-03-25 ENCOUNTER — Ambulatory Visit (INDEPENDENT_AMBULATORY_CARE_PROVIDER_SITE_OTHER): Payer: Medicaid Other | Admitting: Family Medicine

## 2015-03-25 VITALS — BP 129/60 | HR 80 | Ht 62.0 in | Wt 175.0 lb

## 2015-03-25 DIAGNOSIS — M545 Low back pain: Secondary | ICD-10-CM | POA: Diagnosis present

## 2015-03-25 DIAGNOSIS — G8929 Other chronic pain: Secondary | ICD-10-CM | POA: Diagnosis not present

## 2015-03-25 NOTE — Patient Instructions (Signed)
Given the length of time you've had problems with your low back it's likely we will have to do an MRI to assess for a disc herniation though your insurance dictates conservative treatment for 4 weeks then come back and see me. Meloxicam 15mg  daily with food for pain and inflammation Flexeril three times a day as needed for muscle spasms (can make you sleepy - if so do not drive while taking this). Simple range of motion exercises Start physical therapy and do home exercises on days you don't go to therapy. Heat 15 minutes at a time 3-4 times a day to help with spasms. If not improving we will consider xrays and MRIs. Follow up with me in 4 weeks for reevaluation.

## 2015-03-30 DIAGNOSIS — G8929 Other chronic pain: Secondary | ICD-10-CM | POA: Insufficient documentation

## 2015-03-30 DIAGNOSIS — M545 Low back pain: Secondary | ICD-10-CM

## 2015-03-30 NOTE — Assessment & Plan Note (Signed)
Start physical therapy and home exercise program.  Mobic with flexeril as needed for spasms.  Heat as well.  F/u in 4 weeks.  Will go ahead with radiographs and MRI if not improving.

## 2015-03-30 NOTE — Progress Notes (Signed)
PCP and consultation requested by: Delbert Harness MD  Subjective:   HPI: Patient is a 44 y.o. female here for chronic low back pain.  Patient reports she's had over 10 years of low back pain. Pain is constant, throbbing. Currently at 4/10 level but up to 10/10 at times. Can go into both hips and groins. Gets numbness in both legs with prolonged sitting. Tried flexeril, meloxicam most recently without success. 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 Has not done PT for this. No skin changes, fever, other complaints.  Past Medical History  Diagnosis Date  . GERD (gastroesophageal reflux disease)   . Asthmatic bronchitis     history of  . Anxiety   . Depression   . H/O: suicide attempt     x 3  . Acute renal failure [facility])     Current Outpatient Prescriptions on File Prior to Visit  Medication Sig Dispense Refill  . albuterol (PROVENTIL,VENTOLIN) 90 MCG/ACT inhaler Inhale 2 puffs into the lungs every 6 (six) hours as needed for wheezing. 17 g 12  . aspirin-acetaminophen-caffeine (EXCEDRIN MIGRAINE) 250-250-65 MG tablet Take 1 tablet by mouth.    Marland Kitchen atorvastatin (LIPITOR) 20 MG tablet Take 20 mg by mouth.    Marland Kitchen b complex vitamins tablet Take 1 tablet by mouth.    . Black Cohosh 160 MG CAPS Take by mouth.    . co-enzyme Q-10 30 MG capsule Take by mouth.    . Cranberry Fruit 405 MG CAPS Take by mouth.    . cyclobenzaprine (FLEXERIL) 10 MG tablet Take 10 mg by mouth.    Marland Kitchen FLUoxetine (PROZAC) 20 MG tablet Take 60 mg by mouth every morning.     . gabapentin (NEURONTIN) 300 MG capsule Take 600 mg by mouth 3 (three) times daily.    . Ginkgo Biloba Extract 60 MG CAPS Take by mouth.    Marland Kitchen ipratropium (ATROVENT) 0.06 % nasal spray Place 2 sprays into the nose.    . meclizine (ANTIVERT) 25 MG tablet Take 25 mg by mouth 3 (three) times daily as needed for dizziness.     . meloxicam (MOBIC) 15 MG tablet TAKE 1 TABLET(15 MG) BY MOUTH DAILY    . methadone (DOLOPHINE) 10 MG/ML solution Take 100  mg by mouth every morning. Supplied from [facility].    . Multiple Vitamins-Minerals (MULTIVITAMINS THER. W/MINERALS) TABS Take 1 tablet by mouth every morning.     Marland Kitchen omeprazole (PRILOSEC) 20 MG capsule Take 20 mg by mouth daily.      . predniSONE (DELTASONE) 10 MG tablet 6 tabs po day 1, 5 tabs po day 2, 4 tabs po day 3, 3 tabs po day 4, 2 tabs po day 5, 1 tab po day 6 21 tablet 0  . pseudoephedrine (SUDAFED) 120 MG 12 hr tablet Take by mouth.    . ranitidine (ZANTAC) 150 MG tablet Take 150 mg by mouth.    . traZODone (DESYREL) 50 MG tablet Take 50 mg by mouth.    . Vitamin D, Cholecalciferol, 1000 UNITS CAPS Take 1,000 Units by mouth.     No current facility-administered medications on file prior to visit.    Past Surgical History  Procedure Laterality Date  . Cesarean section    . Appendectomy  2004  . Right tympanoplasty  1999  . Tonsillectomy    . Refractive surgery    . Mouth surgery      teeth removed    Allergies  Allergen Reactions  . Sulfonamide  Derivatives Hives    Social History   Social History  . Marital Status: Single    Spouse Name: N/A  . Number of Children: N/A  . Years of Education: N/A   Occupational History  . Not on file.   Social History Main Topics  . Smoking status: Current Every Day Smoker -- 1.00 packs/day for 24 years    Types: Cigarettes  . Smokeless tobacco: Never Used  . Alcohol Use: 0.0 oz/week    0 Standard drinks or equivalent per week     Comment: occ  . Drug Use: Yes     Comment: prescription drugs  . Sexual Activity: No   Other Topics Concern  . Not on file   Social History Narrative    No family history on file.  BP 129/60 mmHg  Pulse 80  Ht 5\' 2"  (1.575 m)  Wt 175 lb (79.379 kg)  BMI 32.00 kg/m2  Review of Systems: See HPI above.    Objective:  Physical Exam:  Gen: NAD  Back: No gross deformity, scoliosis. TTP just left of midline paraspinal lumbar region.  No midline or bony TTP. FROM with pain on flexion  and left lateral rotation. Strength LEs 5/5 all muscle groups.   2+ MSRs in patellar and achilles tendons, equal bilaterally. Negative SLRs. Sensation intact to light touch bilaterally.  Bilateral hips: Negative logroll bilateral hips Negative fabers and piriformis stretches.    Assessment & Plan:  1. Chronic low back pain - Start physical therapy and home exercise program.  Mobic with flexeril as needed for spasms.  Heat as well.  F/u in 4 weeks.  Will go ahead with radiographs and MRI if not improving.

## 2015-04-22 ENCOUNTER — Ambulatory Visit (HOSPITAL_BASED_OUTPATIENT_CLINIC_OR_DEPARTMENT_OTHER)
Admission: RE | Admit: 2015-04-22 | Discharge: 2015-04-22 | Disposition: A | Discharge status: Home / Self Care | Payer: Medicaid Other | Source: Ambulatory Visit | Attending: Family Medicine | Admitting: Family Medicine

## 2015-04-22 ENCOUNTER — Encounter: Payer: Self-pay | Admitting: Family Medicine

## 2015-04-22 ENCOUNTER — Ambulatory Visit (INDEPENDENT_AMBULATORY_CARE_PROVIDER_SITE_OTHER): Payer: Medicaid Other | Admitting: Family Medicine

## 2015-04-22 VITALS — BP 152/91 | HR 69 | Ht 62.0 in | Wt 178.0 lb

## 2015-04-22 DIAGNOSIS — M5442 Lumbago with sciatica, left side: Secondary | ICD-10-CM | POA: Diagnosis not present

## 2015-04-22 DIAGNOSIS — M5441 Lumbago with sciatica, right side: Secondary | ICD-10-CM

## 2015-04-22 DIAGNOSIS — M25512 Pain in left shoulder: Secondary | ICD-10-CM | POA: Diagnosis not present

## 2015-04-22 DIAGNOSIS — G8929 Other chronic pain: Secondary | ICD-10-CM

## 2015-04-22 DIAGNOSIS — M79605 Pain in left leg: Secondary | ICD-10-CM | POA: Insufficient documentation

## 2015-04-22 DIAGNOSIS — M545 Low back pain, unspecified: Secondary | ICD-10-CM

## 2015-04-22 DIAGNOSIS — M542 Cervicalgia: Secondary | ICD-10-CM

## 2015-04-22 NOTE — Patient Instructions (Addendum)
We will go ahead with MRIs of your cervical and lumbar spines.  I will call you with results and next steps.

## 2015-04-27 NOTE — Addendum Note (Signed)
Addended by: Kathi SimpersWISE, Lekeya Rollings F on: 04/27/2015 10:36 AM   Modules accepted: Orders

## 2015-04-27 NOTE — Assessment & Plan Note (Signed)
seems primarily muscular though nerve irritation at cervical level could cause spasms in the region she is sore.  Not improving with home exercises, mobic, flexeril, neurontin.  Will go ahead with MRI.

## 2015-04-27 NOTE — Assessment & Plan Note (Signed)
No changes with home exercises, mobic, flexeril, neurontin.  Will go ahead with MRI to assess for disc herniation, consider ESIs depending on results.

## 2015-04-27 NOTE — Progress Notes (Signed)
PCP and consultation requested by: Delbert Harness MD  Subjective:   HPI: Patient is a 45 y.o. female here for chronic neck and low back pain.  10/12: Patient reports she's had posterior left shoulder pain for 8 years. Started when she had two separate injuries where she fell backwards with arm behind her to brace self, fell onto left side posteriorly. Pain level 5/10 at rest, 8/10 with certain movements. Pain located at and just medial to left scapula. Pain is sharp, throbbing. States she is unable to work due to the pain here and in low back. Had x-rays remotely that were negative. Did some home exercises and stretches without improvement. Had not sought care regularly for this because she did not have insurance. + night pain. Feels better to sleep on left side. Some localized numbness. No bowel/bladder dysfunction. No skin changes, fever, other complaints.  12/1: Patient reports she's had over 10 years of low back pain. Pain is constant, throbbing. Currently at 4/10 level but up to 10/10 at times. Can go into both hips and groins. Gets numbness in both legs with prolonged sitting. Tried flexeril, meloxicam most recently without success. 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 Has not done PT for this. No skin changes, fever, other complaints.  12/29: Patient reports she has not noticed any changes since last visit. Pain level 4/10 in low back, 5/10 upper back/neck - sharp. Has not done physical therapy but is doing home exercises without much benefit. Takes neurontin. Tried mobic and flexeril. Can go into both hips and groins still. Numbness prolonged sitting in both legs. Nothing seems to make pain better. No bowel/bladder dysfunction.  Past Medical History  Diagnosis Date  . GERD (gastroesophageal reflux disease)   . Asthmatic bronchitis     history of  . Anxiety   . Depression   . H/O: suicide attempt     x 3  . Acute renal failure [facility])     Current  Outpatient Prescriptions on File Prior to Visit  Medication Sig Dispense Refill  . albuterol (PROVENTIL,VENTOLIN) 90 MCG/ACT inhaler Inhale 2 puffs into the lungs every 6 (six) hours as needed for wheezing. 17 g 12  . aspirin-acetaminophen-caffeine (EXCEDRIN MIGRAINE) 250-250-65 MG tablet Take 1 tablet by mouth.    Marland Kitchen atorvastatin (LIPITOR) 20 MG tablet Take 20 mg by mouth.    Marland Kitchen b complex vitamins tablet Take 1 tablet by mouth.    . Black Cohosh 160 MG CAPS Take by mouth.    . co-enzyme Q-10 30 MG capsule Take by mouth.    . Cranberry Fruit 405 MG CAPS Take by mouth.    . cyclobenzaprine (FLEXERIL) 10 MG tablet Take 10 mg by mouth.    Marland Kitchen FLUoxetine (PROZAC) 20 MG tablet Take 60 mg by mouth every morning.     . gabapentin (NEURONTIN) 300 MG capsule Take 600 mg by mouth 3 (three) times daily.    . Ginkgo Biloba Extract 60 MG CAPS Take by mouth.    Marland Kitchen ipratropium (ATROVENT) 0.06 % nasal spray Place 2 sprays into the nose.    . meclizine (ANTIVERT) 25 MG tablet Take 25 mg by mouth 3 (three) times daily as needed for dizziness.     . meloxicam (MOBIC) 15 MG tablet TAKE 1 TABLET(15 MG) BY MOUTH DAILY    . methadone (DOLOPHINE) 10 MG/ML solution Take 100 mg by mouth every morning. Supplied from [place].    . Multiple Vitamins-Minerals (MULTIVITAMINS THER. W/MINERALS) TABS Take 1 tablet  by mouth every morning.     Marland Kitchen. omeprazole (PRILOSEC) 20 MG capsule Take 20 mg by mouth daily.      . predniSONE (DELTASONE) 10 MG tablet 6 tabs po day 1, 5 tabs po day 2, 4 tabs po day 3, 3 tabs po day 4, 2 tabs po day 5, 1 tab po day 6 21 tablet 0  . pseudoephedrine (SUDAFED) 120 MG 12 hr tablet Take by mouth.    . ranitidine (ZANTAC) 150 MG tablet Take 150 mg by mouth.    . traZODone (DESYREL) 50 MG tablet Take 50 mg by mouth.    . Vitamin D, Cholecalciferol, 1000 UNITS CAPS Take 1,000 Units by mouth.     No current facility-administered medications on file prior to visit.    Past Surgical History  Procedure  Laterality Date  . Cesarean section    . Appendectomy  2004  . Right tympanoplasty  1999  . Tonsillectomy    . Refractive surgery    . Mouth surgery      teeth removed    Allergies  Allergen Reactions  . Sulfonamide Derivatives Hives    Social History   Social History  . Marital Status: Single    Spouse Name: N/A  . Number of Children: N/A  . Years of Education: N/A   Occupational History  . Not on file.   Social History Main Topics  . Smoking status: Current Every Day Smoker -- 1.00 packs/day for 24 years    Types: Cigarettes  . Smokeless tobacco: Never Used  . Alcohol Use: 0.0 oz/week    0 Standard drinks or equivalent per week     Comment: occ  . Drug Use: Yes     Comment: prescription drugs  . Sexual Activity: No   Other Topics Concern  . Not on file   Social History Narrative    No family history on file.  BP 152/91 mmHg  Pulse 69  Ht 5\' 2"  (1.575 m)  Wt 178 lb (80.74 kg)  BMI 32.55 kg/m2  Review of Systems: See HPI above.    Objective:  Physical Exam:  Gen: NAD  Neck: No gross deformity, swelling, bruising. TTP medial to left scapula. No midline/bony TTP. No other tenderness. FROM neck - pain on right lateral rotation, flexion mainly. BUE strength 5/5.  Sensation intact to light touch.  2+ equal reflexes in triceps, biceps, brachioradialis tendons. Negative spurlings. NV intact distal BUEs.  Left shoulder: No swelling, ecchymoses. No gross deformity. No TTP. FROM. Negative Hawkins, Neers. Negative Speeds, Yergasons. Strength 5/5 with empty can and resisted internal/external rotation. Negative apprehension. NV intact distally.  Back: No gross deformity, scoliosis. TTP just left of midline paraspinal lumbar region.  No midline or bony TTP. FROM with pain on flexion and left lateral rotation. Strength LEs 5/5 all muscle groups.   2+ MSRs in patellar and achilles tendons, equal bilaterally. Negative SLRs. Sensation intact  to light touch bilaterally.  Bilateral hips: Negative logroll bilateral hips Negative fabers and piriformis stretches.    Assessment & Plan:  1. Chronic low back pain - No changes with home exercises, mobic, flexeril, neurontin.  Will go ahead with MRI to assess for disc herniation, consider ESIs depending on results.  2. Upper back/neck pain - seems primarily muscular though nerve irritation at cervical level could cause spasms in the region she is sore.  Not improving with home exercises, mobic, flexeril, neurontin.  Will go ahead with MRI.

## 2015-04-28 ENCOUNTER — Telehealth: Payer: Self-pay | Admitting: Family Medicine

## 2015-04-29 ENCOUNTER — Telehealth: Payer: Self-pay | Admitting: Family Medicine

## 2015-04-29 NOTE — Telephone Encounter (Signed)
Patient notified

## 2015-04-29 NOTE — Telephone Encounter (Signed)
Spoke to patient and MRIs are scheduled for Saturday.

## 2015-05-01 ENCOUNTER — Ambulatory Visit (HOSPITAL_BASED_OUTPATIENT_CLINIC_OR_DEPARTMENT_OTHER): Payer: Medicaid Other

## 2015-05-01 ENCOUNTER — Ambulatory Visit (HOSPITAL_BASED_OUTPATIENT_CLINIC_OR_DEPARTMENT_OTHER): Admission: RE | Admit: 2015-05-01 | Payer: Medicaid Other | Source: Ambulatory Visit

## 2015-05-09 ENCOUNTER — Ambulatory Visit (HOSPITAL_BASED_OUTPATIENT_CLINIC_OR_DEPARTMENT_OTHER)
Admission: RE | Admit: 2015-05-09 | Discharge: 2015-05-09 | Disposition: A | Discharge status: Home / Self Care | Payer: Medicaid Other | Source: Ambulatory Visit | Attending: Family Medicine | Admitting: Family Medicine

## 2015-05-09 DIAGNOSIS — M542 Cervicalgia: Secondary | ICD-10-CM

## 2015-05-09 DIAGNOSIS — M545 Low back pain: Secondary | ICD-10-CM | POA: Insufficient documentation

## 2015-05-09 DIAGNOSIS — M5136 Other intervertebral disc degeneration, lumbar region: Secondary | ICD-10-CM | POA: Diagnosis not present

## 2015-05-09 DIAGNOSIS — M5430 Sciatica, unspecified side: Secondary | ICD-10-CM | POA: Insufficient documentation

## 2015-05-09 DIAGNOSIS — M50221 Other cervical disc displacement at C4-C5 level: Secondary | ICD-10-CM | POA: Insufficient documentation

## 2015-05-09 DIAGNOSIS — M50223 Other cervical disc displacement at C6-C7 level: Secondary | ICD-10-CM | POA: Insufficient documentation

## 2015-05-09 DIAGNOSIS — M50222 Other cervical disc displacement at C5-C6 level: Secondary | ICD-10-CM | POA: Diagnosis not present

## 2015-05-09 DIAGNOSIS — M5441 Lumbago with sciatica, right side: Secondary | ICD-10-CM

## 2015-05-09 DIAGNOSIS — M5442 Lumbago with sciatica, left side: Principal | ICD-10-CM

## 2015-05-09 DIAGNOSIS — M4802 Spinal stenosis, cervical region: Secondary | ICD-10-CM | POA: Diagnosis not present

## 2015-05-10 ENCOUNTER — Ambulatory Visit (INDEPENDENT_AMBULATORY_CARE_PROVIDER_SITE_OTHER): Payer: Medicaid Other | Admitting: Family Medicine

## 2015-05-10 ENCOUNTER — Encounter: Payer: Self-pay | Admitting: Family Medicine

## 2015-05-10 VITALS — BP 156/83 | HR 101 | Ht 62.0 in | Wt 177.0 lb

## 2015-05-10 DIAGNOSIS — M545 Low back pain: Secondary | ICD-10-CM

## 2015-05-10 DIAGNOSIS — G8929 Other chronic pain: Secondary | ICD-10-CM

## 2015-05-10 DIAGNOSIS — M542 Cervicalgia: Secondary | ICD-10-CM

## 2015-05-11 ENCOUNTER — Other Ambulatory Visit: Payer: Self-pay | Admitting: Family Medicine

## 2015-05-11 DIAGNOSIS — M5412 Radiculopathy, cervical region: Secondary | ICD-10-CM

## 2015-05-11 NOTE — Progress Notes (Signed)
PCP and consultation requested by: Delbert Harness MD  Subjective:   HPI: Patient is a 45 y.o. female here for chronic neck and low back pain.  10/12: Patient reports she's had posterior left shoulder pain for 8 years. Started when she had two separate injuries where she fell backwards with arm behind her to brace self, fell onto left side posteriorly. Pain level 5/10 at rest, 8/10 with certain movements. Pain located at and just medial to left scapula. Pain is sharp, throbbing. States she is unable to work due to the pain here and in low back. Had x-rays remotely that were negative. Did some home exercises and stretches without improvement. Had not sought care regularly for this because she did not have insurance. + night pain. Feels better to sleep on left side. Some localized numbness. No bowel/bladder dysfunction. No skin changes, fever, other complaints.  12/1: Patient reports she's had over 10 years of low back pain. Pain is constant, throbbing. Currently at 4/10 level but up to 10/10 at times. Can go into both hips and groins. Gets numbness in both legs with prolonged sitting. Tried flexeril, meloxicam most recently without success. 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 Has not done PT for this. No skin changes, fever, other complaints.  12/29: Patient reports she has not noticed any changes since last visit. Pain level 4/10 in low back, 5/10 upper back/neck - sharp. Has not done physical therapy but is doing home exercises without much benefit. Takes neurontin. Tried mobic and flexeril. Can go into both hips and groins still. Numbness prolonged sitting in both legs. Nothing seems to make pain better. No bowel/bladder dysfunction.  05/10/15: Patient returns to review MRIs of cervical and lumbar spines.  Past Medical History  Diagnosis Date  . GERD (gastroesophageal reflux disease)   . Asthmatic bronchitis     history of  . Anxiety   . Depression   . H/O:  suicide attempt     x 3  . Acute renal failure [facility])     Current Outpatient Prescriptions on File Prior to Visit  Medication Sig Dispense Refill  . albuterol (PROVENTIL,VENTOLIN) 90 MCG/ACT inhaler Inhale 2 puffs into the lungs every 6 (six) hours as needed for wheezing. 17 g 12  . aspirin-acetaminophen-caffeine (EXCEDRIN MIGRAINE) 250-250-65 MG tablet Take 1 tablet by mouth.    Marland Kitchen atorvastatin (LIPITOR) 20 MG tablet Take 20 mg by mouth.    Marland Kitchen b complex vitamins tablet Take 1 tablet by mouth.    . Black Cohosh 160 MG CAPS Take by mouth.    . co-enzyme Q-10 30 MG capsule Take by mouth.    . Cranberry Fruit 405 MG CAPS Take by mouth.    . cyclobenzaprine (FLEXERIL) 10 MG tablet Take 10 mg by mouth.    Marland Kitchen FLUoxetine (PROZAC) 20 MG tablet Take 60 mg by mouth every morning.     . gabapentin (NEURONTIN) 300 MG capsule Take 600 mg by mouth 3 (three) times daily.    . Ginkgo Biloba Extract 60 MG CAPS Take by mouth.    Marland Kitchen ipratropium (ATROVENT) 0.06 % nasal spray Place 2 sprays into the nose.    . meclizine (ANTIVERT) 25 MG tablet Take 25 mg by mouth 3 (three) times daily as needed for dizziness.     . meloxicam (MOBIC) 15 MG tablet TAKE 1 TABLET(15 MG) BY MOUTH DAILY    . methadone (DOLOPHINE) 10 MG/ML solution Take 100 mg by mouth every morning. Supplied from [place].    Marland Kitchen  Multiple Vitamins-Minerals (MULTIVITAMINS THER. W/MINERALS) TABS Take 1 tablet by mouth every morning.     Marland Kitchen omeprazole (PRILOSEC) 20 MG capsule Take 20 mg by mouth daily.      . predniSONE (DELTASONE) 10 MG tablet 6 tabs po day 1, 5 tabs po day 2, 4 tabs po day 3, 3 tabs po day 4, 2 tabs po day 5, 1 tab po day 6 21 tablet 0  . pseudoephedrine (SUDAFED) 120 MG 12 hr tablet Take by mouth.    . ranitidine (ZANTAC) 150 MG tablet Take 150 mg by mouth.    . traZODone (DESYREL) 50 MG tablet Take 50 mg by mouth.    . Vitamin D, Cholecalciferol, 1000 UNITS CAPS Take 1,000 Units by mouth.     No current facility-administered medications  on file prior to visit.    Past Surgical History  Procedure Laterality Date  . Cesarean section    . Appendectomy  2004  . Right tympanoplasty  1999  . Tonsillectomy    . Refractive surgery    . Mouth surgery      teeth removed    Allergies  Allergen Reactions  . Sulfonamide Derivatives Hives    Social History   Social History  . Marital Status: Single    Spouse Name: N/A  . Number of Children: N/A  . Years of Education: N/A   Occupational History  . Not on file.   Social History Main Topics  . Smoking status: Current Every Day Smoker -- 1.00 packs/day for 24 years    Types: Cigarettes  . Smokeless tobacco: Never Used  . Alcohol Use: 0.0 oz/week    0 Standard drinks or equivalent per week     Comment: occ  . Drug Use: Yes     Comment: prescription drugs  . Sexual Activity: No   Other Topics Concern  . Not on file   Social History Narrative    No family history on file.  BP 156/83 mmHg  Pulse 101  Ht  (1.575 m)  Wt 177 lb (80.287 kg)  BMI 32.37 kg/m2  Review of Systems: See HPI above.    Objective:  Physical Exam:  Gen: NAD Exam not repeated today.  Neck: No gross deformity, swelling, bruising. TTP medial to left scapula. No midline/bony TTP. No other tenderness. FROM neck - pain on right lateral rotation, flexion mainly. BUE strength 5/5.  Sensation intact to light touch.  2+ equal reflexes in triceps, biceps, brachioradialis tendons. Negative spurlings. NV intact distal BUEs.  Left shoulder: No swelling, ecchymoses. No gross deformity. No TTP. FROM. Negative Hawkins, Neers. Negative Speeds, Yergasons. Strength 5/5 with empty can and resisted internal/external rotation. Negative apprehension. NV intact distally.  Back: No gross deformity, scoliosis. TTP just left of midline paraspinal lumbar region.  No midline or bony TTP. FROM with pain on flexion and left lateral rotation. Strength LEs 5/5 all muscle groups.   2+  MSRs in patellar and achilles tendons, equal bilaterally. Negative SLRs. Sensation intact to light touch bilaterally.  Bilateral hips: Negative logroll bilateral hips Negative fabers and piriformis stretches.    Assessment & Plan:  1. Chronic low back pain - MRI reviewed and discussed with patient.  Nothing on MRI to account for patient's pain.  Only has mild facet arthropathy at L5-S1, L4-5 with slight disc bulge - no impingement or central stenosis.  Pain should improve with physical therapy - will order this and reevaluate in 6 weeks.    2. Upper  back/neck pain - MRI reviewed and discussed with patient.  She does have central protrusion/extrusion at C4-5 and C5-6.  Likely impingement bilaterally of C5 and C6 nerve roots.  Discussed options and she would like to do physical therapy and try ESIs bilaterally.  Will f/u in 6 weeks for this as well.    Total visit time 15 minutes - over half of which spent counseling, answering questions.

## 2015-05-11 NOTE — Assessment & Plan Note (Signed)
MRI reviewed and discussed with patient.  She does have central protrusion/extrusion at C4-5 and C5-6.  Likely impingement bilaterally of C5 and C6 nerve roots.  Discussed options and she would like to do physical therapy and try ESIs bilaterally.  Will f/u in 6 weeks for this as well.

## 2015-05-11 NOTE — Assessment & Plan Note (Signed)
MRI reviewed and discussed with patient.  Nothing on MRI to account for patient's pain.  Only has mild facet arthropathy at L5-S1, L4-5 with slight disc bulge - no impingement or central stenosis.  Pain should improve with physical therapy - will order this and reevaluate in 6 weeks.

## 2015-05-24 ENCOUNTER — Other Ambulatory Visit: Payer: Self-pay

## 2015-05-27 ENCOUNTER — Ambulatory Visit
Admission: RE | Admit: 2015-05-27 | Discharge: 2015-05-27 | Disposition: A | Discharge status: Home / Self Care | Payer: Medicaid Other | Source: Ambulatory Visit | Attending: Family Medicine | Admitting: Family Medicine

## 2015-05-27 DIAGNOSIS — M5412 Radiculopathy, cervical region: Secondary | ICD-10-CM

## 2015-05-27 MED ORDER — IOHEXOL 300 MG/ML  SOLN
1.0000 mL | Freq: Once | INTRAMUSCULAR | Status: AC | PRN
  Administered 2015-05-27: 1 mL via EPIDURAL

## 2015-05-27 MED ORDER — TRIAMCINOLONE ACETONIDE 40 MG/ML IJ SUSP (RADIOLOGY)
60.0000 mg | Freq: Once | INTRAMUSCULAR | Status: AC
Start: 2015-05-27 — End: 2015-05-27
  Administered 2015-05-27: 60 mg via EPIDURAL

## 2015-05-27 NOTE — Discharge Instructions (Signed)

## 2015-06-16 ENCOUNTER — Ambulatory Visit: Payer: Medicaid Other | Admitting: Family Medicine

## 2015-06-22 ENCOUNTER — Ambulatory Visit: Payer: Medicaid Other | Admitting: Family Medicine

## 2015-08-16 DIAGNOSIS — F331 Major depressive disorder, recurrent, moderate: Secondary | ICD-10-CM | POA: Diagnosis not present

## 2015-08-18 ENCOUNTER — Ambulatory Visit (INDEPENDENT_AMBULATORY_CARE_PROVIDER_SITE_OTHER): Payer: Medicare Other | Admitting: Family Medicine

## 2015-08-18 DIAGNOSIS — M542 Cervicalgia: Secondary | ICD-10-CM | POA: Diagnosis not present

## 2015-08-18 DIAGNOSIS — G8929 Other chronic pain: Secondary | ICD-10-CM

## 2015-08-18 MED ORDER — TIZANIDINE HCL 4 MG PO TABS: 4.0000 mg | ORAL_TABLET | Freq: Three times a day (TID) | ORAL | Status: DC | PRN

## 2015-08-18 NOTE — Patient Instructions (Addendum)
We will set you up for the steroid injection again on the left side. Zanaflex instead of the flexeril for spasms as directed. Start physical therapy and do home exercises on days you don't go to therapy. Follow up with me in 6 weeks for reevaluation.

## 2015-08-19 NOTE — Assessment & Plan Note (Signed)
Recurrence of pain on left side with radiation to left arm.  Likely due to C6 nerve root impingement seen on MRI.  Will go ahead with repeat ESI.  Start physical therapy and home exercises.  Zanaflex for spasms.  F/u in 6 weeks.

## 2015-08-19 NOTE — Progress Notes (Signed)
PCP and consultation requested by: Delbert HarnessKim Briscoe MD  Subjective:   HPI: Patient is a 45 y.o. female here for chronic neck pain.  10/12: Patient reports she's had posterior left shoulder pain for 8 years. Started when she had two separate injuries where she fell backwards with arm behind her to brace self, fell onto left side posteriorly. Pain level 5/10 at rest, 8/10 with certain movements. Pain located at and just medial to left scapula. Pain is sharp, throbbing. States she is unable to work due to the pain here and in low back. Had x-rays remotely that were negative. Did some home exercises and stretches without improvement. Had not sought care regularly for this because she did not have insurance. + night pain. Feels better to sleep on left side. Some localized numbness. No bowel/bladder dysfunction. No skin changes, fever, other complaints.  12/1: Patient reports she's had over 10 years of low back pain. Pain is constant, throbbing. Currently at 4/10 level but up to 10/10 at times. Can go into both hips and groins. Gets numbness in both legs with prolonged sitting. Tried flexeril, meloxicam most recently without success. 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 Has not done PT for this. No skin changes, fever, other complaints.  12/29: Patient reports she has not noticed any changes since last visit. Pain level 4/10 in low back, 5/10 upper back/neck - sharp. Has not done physical therapy but is doing home exercises without much benefit. Takes neurontin. Tried mobic and flexeril. Can go into both hips and groins still. Numbness prolonged sitting in both legs. Nothing seems to make pain better. No bowel/bladder dysfunction.  05/10/15: Patient returns to review MRIs of cervical and lumbar spines.  4/26: Patient returns with left sided neck, shoulder pain. Did well following injection (ESI) 3 months ago. Pain has started to come back in past week. Pain is 5/10, up  to 8/10 and sharp. Radiates into left upper trap, shoulder. Associated with numbness down arm into thumb, index, middle digits. No bowel/bladder dysfunction. Worse with motions of the neck. No skin changes, recent injuries.  Past Medical History  Diagnosis Date  . GERD (gastroesophageal reflux disease)   . Asthmatic bronchitis     history of  . Anxiety   . Depression   . H/O: suicide attempt     x 3  . Acute renal failure [facility])     Current Outpatient Prescriptions on File Prior to Visit  Medication Sig Dispense Refill  . albuterol (PROVENTIL,VENTOLIN) 90 MCG/ACT inhaler Inhale 2 puffs into the lungs every 6 (six) hours as needed for wheezing. 17 g 12  . aspirin-acetaminophen-caffeine (EXCEDRIN MIGRAINE) 250-250-65 MG tablet Take 1 tablet by mouth.    Marland Kitchen. atorvastatin (LIPITOR) 20 MG tablet Take 20 mg by mouth.    Marland Kitchen. b complex vitamins tablet Take 1 tablet by mouth.    . Black Cohosh 160 MG CAPS Take by mouth.    . co-enzyme Q-10 30 MG capsule Take by mouth.    . Cranberry Fruit 405 MG CAPS Take by mouth.    . cyclobenzaprine (FLEXERIL) 10 MG tablet Take 10 mg by mouth.    Marland Kitchen. FLUoxetine (PROZAC) 20 MG tablet Take 60 mg by mouth every morning.     . gabapentin (NEURONTIN) 300 MG capsule Take 600 mg by mouth 3 (three) times daily.    . Ginkgo Biloba Extract 60 MG CAPS Take by mouth.    Marland Kitchen. ipratropium (ATROVENT) 0.06 % nasal spray Place 2 sprays  into the nose.    . meclizine (ANTIVERT) 25 MG tablet Take 25 mg by mouth 3 (three) times daily as needed for dizziness.     . meloxicam (MOBIC) 15 MG tablet TAKE 1 TABLET(15 MG) BY MOUTH DAILY    . methadone (DOLOPHINE) 10 MG/ML solution Take 100 mg by mouth every morning. Supplied from [place].    . Multiple Vitamins-Minerals (MULTIVITAMINS THER. W/MINERALS) TABS Take 1 tablet by mouth every morning.     Marland Kitchen omeprazole (PRILOSEC) 20 MG capsule Take 20 mg by mouth daily.      . pseudoephedrine (SUDAFED) 120 MG 12 hr tablet Take by mouth.    .  ranitidine (ZANTAC) 150 MG tablet Take 150 mg by mouth.    . traZODone (DESYREL) 50 MG tablet Take 50 mg by mouth.    . Vitamin D, Cholecalciferol, 1000 UNITS CAPS Take 1,000 Units by mouth.     No current facility-administered medications on file prior to visit.    Past Surgical History  Procedure Laterality Date  . Cesarean section    . Appendectomy  2004  . Right tympanoplasty  1999  . Tonsillectomy    . Refractive surgery    . Mouth surgery      teeth removed    Allergies  Allergen Reactions  . Sulfonamide Derivatives Hives    Social History   Social History  . Marital Status: Single    Spouse Name: N/A  . Number of Children: N/A  . Years of Education: N/A   Occupational History  . Not on file.   Social History Main Topics  . Smoking status: Current Every Day Smoker -- 1.00 packs/day for 24 years    Types: Cigarettes  . Smokeless tobacco: Never Used  . Alcohol Use: 0.0 oz/week    0 Standard drinks or equivalent per week     Comment: occ  . Drug Use: Yes     Comment: prescription drugs  . Sexual Activity: No   Other Topics Concern  . Not on file   Social History Narrative    No family history on file.  There were no vitals taken for this visit.  Review of Systems: See HPI above.    Objective:  Physical Exam:  Gen: NAD  Neck: No gross deformity, swelling, bruising. TTP left trapezius, cervical paraspinal region.  No midline/bony TTP. No other tenderness. FROM neck - pain on left lateral rotation. BUE strength 5/5.  Sensation intact to light touch currently. 2+ equal reflexes in triceps, biceps, brachioradialis tendons. Negative spurlings. NV intact distal BUEs.  Left shoulder: No swelling, ecchymoses. No gross deformity. No TTP. FROM. Negative Hawkins, Neers. Negative Speeds. Strength 5/5 with empty can and resisted internal/external rotation. NV intact distally.  Assessment & Plan:  1. Upper back/neck pain - Recurrence of pain  on left side with radiation to left arm.  Likely due to C6 nerve root impingement seen on MRI.  Will go ahead with repeat ESI.  Start physical therapy and home exercises.  Zanaflex for spasms.  F/u in 6 weeks.

## 2015-08-20 ENCOUNTER — Other Ambulatory Visit: Payer: Self-pay | Admitting: Family Medicine

## 2015-08-20 DIAGNOSIS — M542 Cervicalgia: Secondary | ICD-10-CM

## 2015-08-20 NOTE — Addendum Note (Signed)
Addended by: Kathi SimpersWISE, Romney Compean F on: 08/20/2015 12:34 PM   Modules accepted: Orders

## 2015-08-24 ENCOUNTER — Telehealth: Payer: Self-pay | Admitting: Family Medicine

## 2015-08-26 NOTE — Telephone Encounter (Signed)
Talked to PT and they have the order.

## 2015-08-30 ENCOUNTER — Other Ambulatory Visit: Payer: Self-pay

## 2015-09-02 ENCOUNTER — Ambulatory Visit: Payer: Medicare Other | Attending: Family Medicine | Admitting: Physical Therapy

## 2015-09-06 ENCOUNTER — Ambulatory Visit
Admission: RE | Admit: 2015-09-06 | Discharge: 2015-09-06 | Disposition: A | Discharge status: Home / Self Care | Payer: Medicare Other | Source: Ambulatory Visit | Attending: Family Medicine | Admitting: Family Medicine

## 2015-09-06 DIAGNOSIS — M542 Cervicalgia: Secondary | ICD-10-CM

## 2015-09-06 DIAGNOSIS — M47812 Spondylosis without myelopathy or radiculopathy, cervical region: Secondary | ICD-10-CM | POA: Diagnosis not present

## 2015-09-06 MED ORDER — IOHEXOL 300 MG/ML  SOLN
1.0000 mL | Freq: Once | INTRAMUSCULAR | Status: AC | PRN
  Administered 2015-09-06: 1 mL via EPIDURAL

## 2015-09-06 MED ORDER — TRIAMCINOLONE ACETONIDE 40 MG/ML IJ SUSP (RADIOLOGY)
60.0000 mg | Freq: Once | INTRAMUSCULAR | Status: AC
Start: 2015-09-06 — End: 2015-09-06
  Administered 2015-09-06: 60 mg via EPIDURAL

## 2015-09-06 MED ORDER — METHYLPREDNISOLONE ACETATE 40 MG/ML INJ SUSP (RADIOLOG: 120.0000 mg | Freq: Once | INTRAMUSCULAR | Status: DC

## 2015-09-06 MED ORDER — IOPAMIDOL (ISOVUE-M 200) INJECTION 41%: 1.0000 mL | Freq: Once | INTRAMUSCULAR | Status: DC

## 2015-09-06 NOTE — Discharge Instructions (Signed)

## 2015-11-24 ENCOUNTER — Other Ambulatory Visit: Payer: Self-pay | Admitting: Family Medicine

## 2015-12-02 ENCOUNTER — Other Ambulatory Visit: Payer: Self-pay | Admitting: Family Medicine

## 2016-06-01 ENCOUNTER — Encounter (HOSPITAL_COMMUNITY): Payer: Self-pay

## 2016-06-15 ENCOUNTER — Ambulatory Visit (HOSPITAL_COMMUNITY): Payer: Self-pay | Admitting: Psychiatry

## 2017-04-25 ENCOUNTER — Emergency Department (HOSPITAL_COMMUNITY)
Admission: EM | Admit: 2017-04-25 | Discharge: 2017-04-25 | Discharge status: Absent without leave | Payer: Medicare Other

## 2017-06-22 ENCOUNTER — Emergency Department (HOSPITAL_COMMUNITY)
Admission: EM | Admit: 2017-06-22 | Discharge: 2017-06-22 | Disposition: A | Discharge status: Home / Self Care | Payer: Medicare Other | Attending: Emergency Medicine | Admitting: Emergency Medicine

## 2017-06-22 ENCOUNTER — Other Ambulatory Visit: Payer: Self-pay

## 2017-06-22 ENCOUNTER — Encounter (HOSPITAL_COMMUNITY): Payer: Self-pay

## 2017-06-22 DIAGNOSIS — F1410 Cocaine abuse, uncomplicated: Secondary | ICD-10-CM | POA: Insufficient documentation

## 2017-06-22 DIAGNOSIS — Z79899 Other long term (current) drug therapy: Secondary | ICD-10-CM | POA: Diagnosis not present

## 2017-06-22 DIAGNOSIS — F17210 Nicotine dependence, cigarettes, uncomplicated: Secondary | ICD-10-CM | POA: Insufficient documentation

## 2017-06-22 DIAGNOSIS — F1910 Other psychoactive substance abuse, uncomplicated: Secondary | ICD-10-CM

## 2017-06-22 DIAGNOSIS — R55 Syncope and collapse: Secondary | ICD-10-CM | POA: Diagnosis not present

## 2017-06-22 DIAGNOSIS — Z5320 Procedure and treatment not carried out because of patient's decision for unspecified reasons: Secondary | ICD-10-CM | POA: Diagnosis not present

## 2017-06-22 HISTORY — DX: Pure hypercholesterolemia, unspecified: E78.00

## 2017-06-22 HISTORY — DX: Other psychoactive substance abuse, uncomplicated: F19.10

## 2017-06-22 HISTORY — DX: Iron deficiency anemia, unspecified: D50.9

## 2017-06-22 HISTORY — DX: Unspecified osteoarthritis, unspecified site: M19.90

## 2017-06-22 NOTE — ED Notes (Signed)
Bed: [facility] Expected date:  Expected time:  Means of arrival:  Comments: Hold for triage 4 

## 2017-06-22 NOTE — ED Triage Notes (Signed)
Pt arrived via EMS pt was fnd in a [employer] bathroom stall > pt was found with a needle in her arm, and admits to cocaine use. Pt was lethargic. Per EMS pt has been Alert and oriented x 4  With EMS. Drug and paraphernalia were confiscated by [employer] who is present during this  Assessment    EMS v/s 154/77 HR 121 RR 16 O2 sat 99 CBG 236

## 2017-06-22 NOTE — ED Notes (Signed)
AVS explained in detail. Pt refused blood working saying, "I'll go to my PCP and get it." Ambulatory upon discharge. Signed AMA paperwork. Given ginger ale for the road. Pleasant, in NAD. A&Ox4.

## 2017-06-22 NOTE — ED Provider Notes (Signed)
[facility]-EMERGENCY DEPT Provider Note   CSN: [id number] Arrival date & time: 06/22/17  1449     History   Chief Complaint Chief Complaint  Patient presents with  . Addiction Problem    HPI Mary Ewing is a 47 y.o. female.  The history is provided by the patient and medical records. No language interpreter was used.   Mary Ewing is a 47 y.o. female  with a PMH of substance abuse who presents to the Emergency Department for substance abuse.  Patient states that she "did something stupid" and pop cocaine earlier today.  She states that she has been tired and had a lot going on, therefore wanted to do cocaine to give her some energy to complete her tasks.  She reports going to the [employer] bathroom to shoot up and the next thing she remembers, EMS was around her.  She believes that she must have passed out.  She states that she will intermittently do cocaine, probably about once a month, for energy.  She reports doing about the same amount as usual.  She was not aware of anything laced, but is concerned that may be what happened.  She denies any suicidal intention to this. Currently frustrated about situation, but no other complaints. Specifically no chest pain, shortness of breath or abdominal pain.   Past Medical History:  Diagnosis Date  . Acute renal failure (HCC)   . Anxiety   . Asthmatic bronchitis    history of  . Depression   . DJD (degenerative joint disease)   . GERD (gastroesophageal reflux disease)   . H/O: suicide attempt    x 3  . High cholesterol   . Iron deficiency anemia   . Substance abuse [facility])     Patient Active Problem List   Diagnosis Date Noted  . Chronic low back pain 03/30/2015  . Neck pain 02/04/2015  . Arthralgia of hip 01/26/2015  . Elevated WBC count 11/11/2014  . 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 HLD (hyperlipidemia) 11/10/2014  . Menopause praecox 10/23/2014  . Opioid dependence in remission (HCC) 10/23/2014  .  Recurrent major depressive disorder (HCC) 10/23/2014  . Pain in shoulder 10/23/2014  . Chronic cervical pain 10/23/2014  . Overdose 04/09/2014  . QT prolongation 04/09/2014  . Elevated transaminase level 04/09/2014  . Rhabdomyolysis 04/09/2014  . PNA (pneumonia) 04/06/2011  . NECK PAIN, ACUTE 06/10/2009  . TOBACCO ABUSE 05/20/2009  . Nausea alone 05/19/2009  . ABDOMINAL PAIN RIGHT LOWER QUADRANT 05/19/2009  . ABDOMINAL PAIN, HX OF 05/18/2009  . HYPERTENSION NEC 11/24/2008  . Extrinsic asthma 09/30/2008  . BACK PAIN 05/28/2008  . ABDOMINAL PAIN, LEFT UPPER QUADRANT 05/28/2008  . Anxiety state 03/24/2008  . DEPRESSION 03/24/2008  . SHOULDER PAIN, LEFT 03/24/2008  . HEADACHE 03/24/2008    Past Surgical History:  Procedure Laterality Date  . APPENDECTOMY  2004  . CESAREAN SECTION    . MOUTH SURGERY     teeth removed  . REFRACTIVE SURGERY    . right tympanoplasty  1999  . TONSILLECTOMY      OB History    No data available       Home Medications    Prior to Admission medications   Medication Sig Start Date End Date Taking? Authorizing Provider  albuterol (PROVENTIL,VENTOLIN) 90 MCG/ACT inhaler Inhale 2 puffs into the lungs every 6 (six) hours as needed for wheezing. 04/10/11 04/09/14  Penny Pia, MD  aspirin-acetaminophen-caffeine (EXCEDRIN MIGRAINE) [phone] MG tablet Take 1  tablet by mouth.    [provider]  atorvastatin (LIPITOR) 20 MG tablet Take 20 mg by mouth. 11/11/14 11/11/15  [provider]  b complex vitamins tablet Take 1 tablet by mouth.    [provider]  Black Cohosh 160 MG CAPS Take by mouth.    [provider]  co-enzyme Q-10 30 MG capsule Take by mouth.    [provider]  Cranberry Fruit 405 MG CAPS Take by mouth.    [provider]  cyclobenzaprine (FLEXERIL) 10 MG tablet Take 10 mg by mouth. 12/23/14   [provider]  FLUoxetine (PROZAC) 20 MG tablet Take 60 mg by mouth every  morning.     [provider]  gabapentin (NEURONTIN) 300 MG capsule Take 600 mg by mouth 3 (three) times daily.    [provider]  Ginkgo Biloba Extract 60 MG CAPS Take by mouth.    [provider]  ipratropium (ATROVENT) 0.06 % nasal spray Place 2 sprays into the nose.    [provider]  LATUDA 20 MG TABS tablet  08/16/15   [provider]  meclizine (ANTIVERT) 25 MG tablet Take 25 mg by mouth 3 (three) times daily as needed for dizziness.     [provider]  meloxicam (MOBIC) 15 MG tablet TAKE 1 TABLET(15 MG) BY MOUTH DAILY 01/15/15   [provider]  methadone (DOLOPHINE) 10 MG/ML solution Take 100 mg by mouth every morning. Supplied from [facility].    [provider]  Multiple Vitamins-Minerals (MULTIVITAMINS THER. W/MINERALS) TABS Take 1 tablet by mouth every morning.     [provider]  omeprazole (PRILOSEC) 20 MG capsule Take 20 mg by mouth daily.      [provider]  pseudoephedrine (SUDAFED) 120 MG 12 hr tablet Take by mouth.    [provider]  ranitidine (ZANTAC) 150 MG tablet Take 150 mg by mouth.    [provider]  tiZANidine (ZANAFLEX) 4 MG tablet Take 1 tablet (4 mg total) by mouth every 8 (eight) hours as needed for muscle spasms. 08/18/15   Lenda KelpHudnall, Shane R, MD  traZODone (DESYREL) 50 MG tablet Take 50 mg by mouth.    [provider]  Vitamin D, Cholecalciferol, 1000 UNITS CAPS Take 1,000 Units by mouth.    [provider]    Family History Family History  Problem Relation Age of Onset  . Asthma Mother   . COPD Mother   . Anxiety disorder Mother   . Diabetes Mother   . Depression Mother   . Hypertension Mother     Social History Social History   Tobacco Use  . Smoking status: Current Every Day Smoker    Packs/day: 1.00    Years: 24.00    Pack years: 24.00    Types: Cigarettes  . Smokeless tobacco: Never Used  Substance Use Topics  .  Alcohol use: Yes    Alcohol/week: 0.0 oz    Comment: occ  . Drug use: Yes    Types: Cocaine    Comment: prescription drugs     Allergies   Sulfonamide derivatives   Review of Systems Review of Systems  Neurological: Positive for syncope.  All other systems reviewed and are negative.    Physical Exam Updated Vital Signs BP (!) 149/106 (BP Location: Left Arm)   Pulse (!) 124   Temp 98.2 F (36.8 C) (Oral)   Resp 20   Ht 5\' 2"  (1.575 m)   Wt  76.2 kg (168 lb)   LMP 06/08/2017 (Approximate)   SpO2 94%   BMI 30.73 kg/m   Physical Exam  Constitutional: She appears well-developed and well-nourished. No distress.  HENT:  Head: Normocephalic and atraumatic.  Neck: Neck supple. No JVD present.  Cardiovascular: Normal heart sounds.  No murmur heard. Tachycardic but regular.  Pulmonary/Chest: Effort normal and breath sounds normal. No respiratory distress. She has no wheezes. She has no rales. She exhibits no tenderness.  Abdominal: Soft. She exhibits no distension. There is no tenderness.  Neurological:  A&Ox4. Speech clear and goal oriented. CN 2-12 grossly intact. Normal finger-to-nose and rapid alternating movements. No drift. Strength and sensation intact. Steady gait.   Skin: Skin is warm and dry.  Nursing note and vitals reviewed.    ED Treatments / Results  Labs (all labs ordered are listed, but only abnormal results are displayed) Labs Reviewed - No data to display  EKG  EKG Interpretation  Date/Time:  Friday June 22 2017 16:28:04 EST Ventricular Rate:  101 PR Interval:    QRS Duration: 89 QT Interval:  351 QTC Calculation: 455 R Axis:   38 Text Interpretation:  Sinus tachycardia Confirmed by Loren Racer ([id number]) on 06/22/2017 4:50:50 PM       Radiology No results found.  Procedures Procedures (including critical care time)  Medications Ordered in ED Medications - No data to display   Initial Impression / Assessment and Plan / ED Course    I have reviewed the triage vital signs and the nursing notes.  Pertinent labs & imaging results that were available during my care of the patient were reviewed by me and considered in my medical decision making (see chart for details).    Mary Ewing is a 47 y.o. female who presents to ED for syncopal episode after doing cocaine just prior to arrival. She is now alert & oriented x4 with no focal neuro deficits. She is tachycardic, but regular rhythm. EKG sinus tachy with HR of 101. Recommended fluids and blood work as well, but patient states that she must go home. I have discussed my concerns as a provider and the possibility that this may worsen. I have specifically discussed that without further evaluation I cannot guarantee there is not a life threatening event occurring, especially with syncopal episode.  Time was given to allow the opportunity to ask questions and consider the options, and after the discussion, the patient decided to refuse the offered workup and fluids. Pt is A&Ox4, her own POA and states understanding of my concerns and the possible consequences. After refusal, I made every reasonable opportunity to treat them to the best of my ability. I have made the patient aware that this is an AMA discharge, but she may return at any time for further evaluation and treatment. She denies any SI and states this was not intentional overdose. She does not appear to be a threat to herself or others. Discharged AMA.    Patient discussed with Dr. Ranae Palms who agrees with treatment plan.    Final Clinical Impressions(s) / ED Diagnoses   Final diagnoses:  Substance abuse [facility])    ED Discharge Orders    None       Daimien Patmon, Chase Picket, PA-C 06/22/17 1653    Loren Racer, MD 06/22/17 2246

## 2018-05-03 ENCOUNTER — Encounter: Payer: Self-pay | Admitting: Family Medicine

## 2018-05-03 ENCOUNTER — Ambulatory Visit (INDEPENDENT_AMBULATORY_CARE_PROVIDER_SITE_OTHER): Payer: Medicare Other | Admitting: Family Medicine

## 2018-05-03 VITALS — BP 122/75 | HR 85 | Ht 62.0 in | Wt 180.0 lb

## 2018-05-03 DIAGNOSIS — M545 Low back pain: Secondary | ICD-10-CM

## 2018-05-03 DIAGNOSIS — M79604 Pain in right leg: Secondary | ICD-10-CM

## 2018-05-03 DIAGNOSIS — M79605 Pain in left leg: Secondary | ICD-10-CM

## 2018-05-03 MED ORDER — PREDNISONE 10 MG PO TABS: ORAL_TABLET | ORAL | 0 refills | Status: DC

## 2018-05-03 NOTE — Patient Instructions (Signed)
You have lumbar radiculopathy (a pinched nerve in your low back). Take tylenol for baseline pain relief (1-2 extra strength tabs 3x/day) A prednisone dose pack is the best option for immediate relief and may be prescribed. Day after finishing prednisone start aleve 2 tabs twice a day with food for pain and inflammation. Baclofen as needed for muscle spasms (no driving on this medicine if it makes you sleepy). Continue with the CBD. Salon pas patches as well will help with the pain. Stay as active as possible. Physical therapy has been shown to be helpful as well but you wouldn't tolerate it currently. Strengthening of low back muscles, abdominal musculature are key for long term pain relief. We will check with Dr. Leonie Green office to see if they've gotten an MRI approved or if it's in process- if not we will go ahead with this Follow up and next steps will depend on the MRI.

## 2018-05-06 ENCOUNTER — Encounter: Payer: Self-pay | Admitting: Family Medicine

## 2018-05-06 NOTE — Progress Notes (Signed)
PCP: Macy Mis, MD  Subjective:   HPI: Patient is a 48 y.o. female here for low back pain.  Patient reports continued chronic low back pain but reports past month has been worse than usual. Pain level 7-8/10 and sharp right side of low back with stabbing sensation into buttock, hip, pelvis. Some radiation into thigh but also radiating up her back. No numbness, bowel/bladder dysfunction. She takes gabapentin, baclofen, CBD oil, and naproxen. Finished a prednisone dose pack without much benefit. Feels symptoms occurring into left leg now as well with weakness in both legs, difficulty getting comfortable  Past Medical History:  Diagnosis Date  . Acute renal failure (HCC)   . Anxiety   . Asthmatic bronchitis    history of  . Depression   . DJD (degenerative joint disease)   . GERD (gastroesophageal reflux disease)   . H/O: suicide attempt    x 3  . High cholesterol   . Iron deficiency anemia   . Substance abuse (HCC)     Current Outpatient Medications on File Prior to Visit  Medication Sig Dispense Refill  . baclofen (LIORESAL) 10 MG tablet Take by mouth.    . QUEtiapine (SEROQUEL) 300 MG tablet Take by mouth.    Marland Kitchen albuterol (PROVENTIL,VENTOLIN) 90 MCG/ACT inhaler Inhale 2 puffs into the lungs every 6 (six) hours as needed for wheezing. 17 g 12  . aspirin-acetaminophen-caffeine (EXCEDRIN MIGRAINE) 250-250-65 MG tablet Take 1 tablet by mouth.    Marland Kitchen atorvastatin (LIPITOR) 20 MG tablet Take 20 mg by mouth.    Marland Kitchen FLUoxetine (PROZAC) 20 MG tablet Take 60 mg by mouth every morning.     . gabapentin (NEURONTIN) 300 MG capsule Take 600 mg by mouth 3 (three) times daily.    Marland Kitchen ibuprofen (ADVIL,MOTRIN) 200 MG tablet Take 600 mg by mouth every 6 (six) hours as needed for headache or mild pain.    . Melatonin 10 MG TABS Take 10 mg by mouth at bedtime.    . Multiple Vitamins-Minerals (THERA-M) TABS Take by mouth.    . traZODone (DESYREL) 50 MG tablet Take 50-100 mg by mouth.      No  current facility-administered medications on file prior to visit.     Past Surgical History:  Procedure Laterality Date  . APPENDECTOMY  2004  . CESAREAN SECTION    . MOUTH SURGERY     teeth removed  . REFRACTIVE SURGERY    . right tympanoplasty  1999  . TONSILLECTOMY      Allergies  Allergen Reactions  . Sulfonamide Derivatives Hives    Social History   Socioeconomic History  . Marital status: Single    Spouse name: Not on file  . Number of children: Not on file  . Years of education: Not on file  . Highest education level: Not on file  Occupational History  . Not on file  Social Needs  . Financial resource strain: Not on file  . Food insecurity:    Worry: Not on file    Inability: Not on file  . Transportation needs:    Medical: Not on file    Non-medical: Not on file  Tobacco Use  . Smoking status: Current Every Day Smoker    Packs/day: 1.00    Years: 24.00    Pack years: 24.00    Types: Cigarettes  . Smokeless tobacco: Never Used  Substance and Sexual Activity  . Alcohol use: Yes    Alcohol/week: 0.0 standard drinks  Comment: occ  . Drug use: Yes    Types: Cocaine    Comment: prescription drugs  . Sexual activity: Never    Birth control/protection: None  Lifestyle  . Physical activity:    Days per week: Not on file    Minutes per session: Not on file  . Stress: Not on file  Relationships  . Social connections:    Talks on phone: Not on file    Gets together: Not on file    Attends religious service: Not on file    Active member of club or organization: Not on file    Attends meetings of clubs or organizations: Not on file    Relationship status: Not on file  . Intimate partner violence:    Fear of current or ex partner: Not on file    Emotionally abused: Not on file    Physically abused: Not on file    Forced sexual activity: Not on file  Other Topics Concern  . Not on file  Social History Narrative  . Not on file    Family History   Problem Relation Age of Onset  . Asthma Mother   . COPD Mother   . Anxiety disorder Mother   . Diabetes Mother   . Depression Mother   . Hypertension Mother     BP 122/75   Pulse 85   Ht 5\' 2"  (1.575 m)   Wt 180 lb (81.6 kg)   BMI 32.92 kg/m   Review of Systems: See HPI above.     Objective:  Physical Exam:  Gen: tearful  Back: No gross deformity, scoliosis. TTP right > left paraspinal lumbar regions.  No midline or bony TTP. Limited flexion and extension 2/2 pain. Strength right hip flexion 3/5, bilateral knee extension decreased with 3/5 on left, 4/5 on right.  All other muscle groups 5/5 bilateral lower extremities. 2+ MSRs in patellar and achilles tendons, equal bilaterally. Positive SLR on right, equivocal on left. Sensation intact to light touch bilaterally.  Bilateral hips: No deformity. FROM left, only 15 degrees IR on right but not painful.  Right hip flexion 3/5 - otherwise 5/5 hip strength. No tenderness to palpation. Negative logroll bilateral hips   Assessment & Plan:  1. Low back pain with radiation into both legs - concerning given severity of pain, associated weakness, and not responding to one course of prednisone.  Radiographs from 1/6 with only mild spondylosis L4-5, mild scoliosis.  Will go ahead with MRI to further assess - will check to see if PCP's office has started this process first.  In meantime, second course of prednisone, baclofen, topical medications and patches reviewed.

## 2018-05-07 NOTE — Addendum Note (Signed)
Addended by: Kathi Simpers F on: 05/07/2018 01:32 PM   Modules accepted: Orders

## 2018-05-15 ENCOUNTER — Telehealth: Payer: Self-pay | Admitting: Family Medicine

## 2018-05-15 MED ORDER — DIAZEPAM 5 MG PO TABS: ORAL_TABLET | ORAL | 0 refills | Status: DC

## 2018-05-15 NOTE — Telephone Encounter (Signed)
-----   Message from Lizbeth Bark sent at 05/13/2018 10:50 AM EST ----- Regarding: FW: Rx Request  ----- Message ----- From: Corrin Parker Sent: 05/13/2018  10:39 AM EST To: Lizbeth Bark Subject: Rx Request                                     Dr. Lazaro Arms patient is requesting Valium Rx for MRI scheduled for this Thursday 1/23.   Pharmacy: [facility] on [address]

## 2018-05-15 NOTE — Telephone Encounter (Signed)
Please let her know I sent in 2 tabs of valium to her pharmacy.  Thanks!

## 2018-05-16 ENCOUNTER — Other Ambulatory Visit: Payer: Self-pay

## 2018-05-19 ENCOUNTER — Ambulatory Visit
Admission: RE | Admit: 2018-05-19 | Discharge: 2018-05-19 | Disposition: A | Discharge status: Home / Self Care | Payer: Medicare Other | Source: Ambulatory Visit | Attending: Family Medicine | Admitting: Family Medicine

## 2018-05-19 DIAGNOSIS — M79604 Pain in right leg: Secondary | ICD-10-CM

## 2018-05-19 DIAGNOSIS — M545 Low back pain: Secondary | ICD-10-CM

## 2018-05-23 ENCOUNTER — Other Ambulatory Visit: Payer: Self-pay

## 2018-05-23 ENCOUNTER — Encounter (HOSPITAL_COMMUNITY): Payer: Self-pay | Admitting: Internal Medicine

## 2018-05-23 ENCOUNTER — Inpatient Hospital Stay (HOSPITAL_COMMUNITY)
Admission: AD | Admit: 2018-05-23 | Discharge: 2018-05-29 | DRG: 541 | Disposition: A | Discharge status: Home Health | Payer: Medicare Other | Source: Ambulatory Visit | Attending: Internal Medicine | Admitting: Internal Medicine

## 2018-05-23 DIAGNOSIS — E785 Hyperlipidemia, unspecified: Secondary | ICD-10-CM | POA: Diagnosis present

## 2018-05-23 DIAGNOSIS — F17210 Nicotine dependence, cigarettes, uncomplicated: Secondary | ICD-10-CM | POA: Diagnosis present

## 2018-05-23 DIAGNOSIS — M4626 Osteomyelitis of vertebra, lumbar region: Secondary | ICD-10-CM | POA: Diagnosis present

## 2018-05-23 DIAGNOSIS — G061 Intraspinal abscess and granuloma: Secondary | ICD-10-CM | POA: Diagnosis not present

## 2018-05-23 DIAGNOSIS — M4646 Discitis, unspecified, lumbar region: Secondary | ICD-10-CM | POA: Diagnosis present

## 2018-05-23 DIAGNOSIS — M869 Osteomyelitis, unspecified: Secondary | ICD-10-CM

## 2018-05-23 DIAGNOSIS — Z8249 Family history of ischemic heart disease and other diseases of the circulatory system: Secondary | ICD-10-CM | POA: Diagnosis not present

## 2018-05-23 DIAGNOSIS — Z825 Family history of asthma and other chronic lower respiratory diseases: Secondary | ICD-10-CM | POA: Diagnosis not present

## 2018-05-23 DIAGNOSIS — K029 Dental caries, unspecified: Secondary | ICD-10-CM | POA: Diagnosis present

## 2018-05-23 DIAGNOSIS — K047 Periapical abscess without sinus: Secondary | ICD-10-CM | POA: Diagnosis not present

## 2018-05-23 DIAGNOSIS — K056 Periodontal disease, unspecified: Secondary | ICD-10-CM | POA: Diagnosis present

## 2018-05-23 DIAGNOSIS — F418 Other specified anxiety disorders: Secondary | ICD-10-CM | POA: Diagnosis present

## 2018-05-23 DIAGNOSIS — Z833 Family history of diabetes mellitus: Secondary | ICD-10-CM

## 2018-05-23 DIAGNOSIS — K219 Gastro-esophageal reflux disease without esophagitis: Secondary | ICD-10-CM | POA: Diagnosis present

## 2018-05-23 DIAGNOSIS — F1121 Opioid dependence, in remission: Secondary | ICD-10-CM | POA: Diagnosis present

## 2018-05-23 DIAGNOSIS — M4647 Discitis, unspecified, lumbosacral region: Secondary | ICD-10-CM | POA: Diagnosis not present

## 2018-05-23 DIAGNOSIS — Z818 Family history of other mental and behavioral disorders: Secondary | ICD-10-CM | POA: Diagnosis not present

## 2018-05-23 DIAGNOSIS — E7800 Pure hypercholesterolemia, unspecified: Secondary | ICD-10-CM | POA: Diagnosis present

## 2018-05-23 DIAGNOSIS — Z720 Tobacco use: Secondary | ICD-10-CM | POA: Diagnosis present

## 2018-05-23 DIAGNOSIS — M48061 Spinal stenosis, lumbar region without neurogenic claudication: Secondary | ICD-10-CM | POA: Diagnosis present

## 2018-05-23 DIAGNOSIS — F1210 Cannabis abuse, uncomplicated: Secondary | ICD-10-CM | POA: Diagnosis present

## 2018-05-23 DIAGNOSIS — M549 Dorsalgia, unspecified: Secondary | ICD-10-CM | POA: Diagnosis not present

## 2018-05-23 DIAGNOSIS — F329 Major depressive disorder, single episode, unspecified: Secondary | ICD-10-CM | POA: Diagnosis not present

## 2018-05-23 DIAGNOSIS — F17200 Nicotine dependence, unspecified, uncomplicated: Secondary | ICD-10-CM | POA: Diagnosis not present

## 2018-05-23 DIAGNOSIS — Z882 Allergy status to sulfonamides status: Secondary | ICD-10-CM | POA: Diagnosis not present

## 2018-05-23 DIAGNOSIS — K0889 Other specified disorders of teeth and supporting structures: Secondary | ICD-10-CM | POA: Diagnosis not present

## 2018-05-23 DIAGNOSIS — M47817 Spondylosis without myelopathy or radiculopathy, lumbosacral region: Secondary | ICD-10-CM | POA: Diagnosis present

## 2018-05-23 DIAGNOSIS — K8020 Calculus of gallbladder without cholecystitis without obstruction: Secondary | ICD-10-CM | POA: Diagnosis present

## 2018-05-23 DIAGNOSIS — I10 Essential (primary) hypertension: Secondary | ICD-10-CM | POA: Diagnosis present

## 2018-05-23 DIAGNOSIS — R011 Cardiac murmur, unspecified: Secondary | ICD-10-CM | POA: Diagnosis not present

## 2018-05-23 LAB — CBC WITH DIFFERENTIAL/PLATELET
Abs Immature Granulocytes: 0.05 10*3/uL (ref 0.00–0.07)
BASOS ABS: 0 10*3/uL (ref 0.0–0.1)
BASOS PCT: 0 %
EOS ABS: 0.1 10*3/uL (ref 0.0–0.5)
Eosinophils Relative: 1 %
HCT: 37.3 % (ref 36.0–46.0)
Hemoglobin: 11.7 g/dL — ABNORMAL LOW (ref 12.0–15.0)
IMMATURE GRANULOCYTES: 1 %
Lymphocytes Relative: 23 %
Lymphs Abs: 2 10*3/uL (ref 0.7–4.0)
MCH: 26.8 pg (ref 26.0–34.0)
MCHC: 31.4 g/dL (ref 30.0–36.0)
MCV: 85.4 fL (ref 80.0–100.0)
Monocytes Absolute: 0.6 10*3/uL (ref 0.1–1.0)
Monocytes Relative: 7 %
NRBC: 0 % (ref 0.0–0.2)
Neutro Abs: 5.8 10*3/uL (ref 1.7–7.7)
Neutrophils Relative %: 68 %
PLATELETS: 366 10*3/uL (ref 150–400)
RBC: 4.37 MIL/uL (ref 3.87–5.11)
RDW: 13.3 % (ref 11.5–15.5)
WBC: 8.6 10*3/uL (ref 4.0–10.5)

## 2018-05-23 LAB — COMPREHENSIVE METABOLIC PANEL
ALBUMIN: 3.7 g/dL (ref 3.5–5.0)
ALT: 15 U/L (ref 0–44)
AST: 23 U/L (ref 15–41)
Alkaline Phosphatase: 90 U/L (ref 38–126)
Anion gap: 13 (ref 5–15)
BUN: 8 mg/dL (ref 6–20)
CHLORIDE: 97 mmol/L — AB (ref 98–111)
CO2: 26 mmol/L (ref 22–32)
Calcium: 9.3 mg/dL (ref 8.9–10.3)
Creatinine, Ser: 1.1 mg/dL — ABNORMAL HIGH (ref 0.44–1.00)
GFR calc Af Amer: >60 mL/min (ref >60)
GFR calc non Af Amer: 60 mL/min — ABNORMAL LOW (ref >60)
GLUCOSE: 100 mg/dL — AB (ref 70–99)
POTASSIUM: 3.9 mmol/L (ref 3.5–5.1)
Sodium: 136 mmol/L (ref 135–145)
Total Bilirubin: 0.4 mg/dL (ref 0.3–1.2)
Total Protein: 7.9 g/dL (ref 6.5–8.1)

## 2018-05-23 LAB — RAPID URINE DRUG SCREEN, HOSP PERFORMED
Amphetamines: NOT DETECTED
Barbiturates: NOT DETECTED
Benzodiazepines: NOT DETECTED
COCAINE: NOT DETECTED
Opiates: NOT DETECTED
Tetrahydrocannabinol: POSITIVE — AB

## 2018-05-23 LAB — SEDIMENTATION RATE: Sed Rate: 64 mm/hr — ABNORMAL HIGH (ref 0–22)

## 2018-05-23 LAB — C-REACTIVE PROTEIN: CRP: 2.1 mg/dL — ABNORMAL HIGH (ref <1.0)

## 2018-05-23 MED ORDER — ONDANSETRON HCL 4 MG/2ML IJ SOLN: 4.0000 mg | Freq: Four times a day (QID) | INTRAMUSCULAR | Status: DC | PRN

## 2018-05-23 MED ORDER — ACETAMINOPHEN 325 MG PO TABS: 650.0000 mg | ORAL_TABLET | Freq: Four times a day (QID) | ORAL | Status: DC | PRN

## 2018-05-23 MED ORDER — OXYCODONE HCL 5 MG PO TABS
5.0000 mg | ORAL_TABLET | ORAL | Status: DC | PRN
  Administered 2018-05-23 – 2018-05-25 (×5): 5 mg via ORAL
  Filled 2018-05-23 (×6): qty 1

## 2018-05-23 MED ORDER — KETOROLAC TROMETHAMINE 30 MG/ML IJ SOLN
30.0000 mg | Freq: Four times a day (QID) | INTRAMUSCULAR | Status: AC | PRN
  Administered 2018-05-24 – 2018-05-28 (×8): 30 mg via INTRAVENOUS
  Filled 2018-05-23 (×9): qty 1

## 2018-05-23 MED ORDER — MELATONIN 3 MG PO TABS
9.0000 mg | ORAL_TABLET | Freq: Every day | ORAL | Status: DC
  Administered 2018-05-23 – 2018-05-28 (×6): 9 mg via ORAL
  Filled 2018-05-23 (×7): qty 3

## 2018-05-23 MED ORDER — TRAZODONE HCL 50 MG PO TABS: 50.0000 mg | ORAL_TABLET | Freq: Every evening | ORAL | Status: DC | PRN

## 2018-05-23 MED ORDER — ATORVASTATIN CALCIUM 10 MG PO TABS
20.0000 mg | ORAL_TABLET | Freq: Every day | ORAL | Status: DC
  Administered 2018-05-23 – 2018-05-29 (×7): 20 mg via ORAL
  Filled 2018-05-23 (×7): qty 2

## 2018-05-23 MED ORDER — NICOTINE 14 MG/24HR TD PT24
14.0000 mg | MEDICATED_PATCH | Freq: Every day | TRANSDERMAL | Status: DC
  Administered 2018-05-23 – 2018-05-29 (×7): 14 mg via TRANSDERMAL
  Filled 2018-05-23 (×7): qty 1

## 2018-05-23 MED ORDER — DOCUSATE SODIUM 100 MG PO CAPS
100.0000 mg | ORAL_CAPSULE | Freq: Two times a day (BID) | ORAL | Status: DC
  Administered 2018-05-23 – 2018-05-29 (×11): 100 mg via ORAL
  Filled 2018-05-23 (×12): qty 1

## 2018-05-23 MED ORDER — BACLOFEN 10 MG PO TABS
5.0000 mg | ORAL_TABLET | Freq: Two times a day (BID) | ORAL | Status: DC
  Administered 2018-05-23 – 2018-05-29 (×12): 5 mg via ORAL
  Filled 2018-05-23 (×12): qty 1

## 2018-05-23 MED ORDER — GABAPENTIN 300 MG PO CAPS
600.0000 mg | ORAL_CAPSULE | Freq: Three times a day (TID) | ORAL | Status: DC
  Administered 2018-05-23 – 2018-05-29 (×19): 600 mg via ORAL
  Filled 2018-05-23 (×19): qty 2

## 2018-05-23 MED ORDER — ACETAMINOPHEN 650 MG RE SUPP: 650.0000 mg | Freq: Four times a day (QID) | RECTAL | Status: DC | PRN

## 2018-05-23 MED ORDER — FLUOXETINE HCL 20 MG PO CAPS
60.0000 mg | ORAL_CAPSULE | Freq: Every morning | ORAL | Status: DC
  Administered 2018-05-25 – 2018-05-29 (×5): 60 mg via ORAL
  Filled 2018-05-23 (×5): qty 3

## 2018-05-23 MED ORDER — QUETIAPINE FUMARATE 50 MG PO TABS
300.0000 mg | ORAL_TABLET | Freq: Every day | ORAL | Status: DC
  Administered 2018-05-23 – 2018-05-28 (×6): 300 mg via ORAL
  Filled 2018-05-23 (×5): qty 6
  Filled 2018-05-23: qty 12

## 2018-05-23 MED ORDER — ONDANSETRON HCL 4 MG PO TABS: 4.0000 mg | ORAL_TABLET | Freq: Four times a day (QID) | ORAL | Status: DC | PRN

## 2018-05-23 NOTE — Progress Notes (Addendum)
Patient arrived to 3w-35. Family at bedside. Patient is a direct admit from Dr. Dutch Quint. Patient is A&O x4. Skin intact. Patient states 6/10 pain in lower back.  Vape was found in bed with patient. Nurse educated about importance of stopping. Patient stated her mother would be taking it home. Nurse will continue to monitor. Mary Ewing

## 2018-05-23 NOTE — H&P (Signed)
History and Physical    Mary Ewing:[id number] DOB: 11-16-1970 DOA: 05/23/2018  PCP: Katherina Mires, MD Consultants:  Trenton Gammon - neurosurgery; Hudnall - sports medicine Patient coming from: Home - lives with mother and son; Donald Prose: Mother, [phone]  Chief Complaint: Back pain  HPI: Mary Ewing is a 48 y.o. female with medical history significant of h/o substance abuse, HLD, and depression with SI presenting as a direct admission from Dr. Trenton Gammon.  She woke up on Christmas Eve AM with terrible low back pain.  There was nothing she could do to make it better.  It has gotten progressively worse and her right leg has given out on her a few times, causing her to fall down.  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  She has never required surgery.  Intermittent fevers, as high as 102.8.  Her last fever was maybe a week ago.  Her feet have been cracking and peeling for several weeks, with bleeding.  She has also had 2 abscessed teeth, including one a week or two before Christmas; she has not had them pulled but did get a couple of rounds of antibiotics.   Course: She presented to the office with lumbar osteomyelitis with back pain, not surgical. She does not need ER evaluation. He saw her this AM. Patient with spontaneous septic joint at L3-4 based on MRI yesterday. Needs blood cultures. ESR 87, CRP 32 yesterday, should repeat. Normal WBC count. Needs facet joint aspiration and needs antibiotic treatment. Does not need surgery at this time.   Review of Systems: As per HPI; otherwise review of systems reviewed and negative.   Ambulatory Status:  Ambulates without assistance  Past Medical History:  Diagnosis Date  . Acute renal failure ([place])   . Anxiety   . Asthmatic bronchitis    history of  . Depression   . DJD (degenerative joint disease)   . GERD (gastroesophageal reflux disease)   . H/O: suicide attempt    x 3  . High cholesterol   . Iron deficiency anemia   . Substance  abuse [facility])     Past Surgical History:  Procedure Laterality Date  . APPENDECTOMY  2004  . CESAREAN SECTION    . MOUTH SURGERY     teeth removed  . REFRACTIVE SURGERY    . right tympanoplasty  1999  . TONSILLECTOMY      Social History   Socioeconomic History  . Marital status: Single    Spouse name: Not on file  . Number of children: Not on file  . Years of education: Not on file  . Highest education level: Not on file  Occupational History  . Occupation: disability  Social Needs  . Financial resource strain: Not on file  . Food insecurity:    Worry: Not on file    Inability: Not on file  . Transportation needs:    Medical: Not on file    Non-medical: Not on file  Tobacco Use  . Smoking status: Current Every Day Smoker    Packs/day: 1.00    Years: 24.00    Pack years: 24.00    Types: Cigarettes  . Smokeless tobacco: Never Used  . Tobacco comment: needs a patch  Substance and Sexual Activity  . Alcohol use: Yes    Alcohol/week: 0.0 standard drinks    Comment: occ  . Drug use: Yes    Types: Cocaine, Marijuana    Comment: prescription drugs;last use of  marijuana was this AM, denies cocaine   . Sexual activity: Never    Birth control/protection: None  Lifestyle  . Physical activity:    Days per week: Not on file    Minutes per session: Not on file  . Stress: Not on file  Relationships  . Social connections:    Talks on phone: Not on file    Gets together: Not on file    Attends religious service: Not on file    Active member of club or organization: Not on file    Attends meetings of clubs or organizations: Not on file    Relationship status: Not on file  . Intimate partner violence:    Fear of current or ex partner: Not on file    Emotionally abused: Not on file    Physically abused: Not on file    Forced sexual activity: Not on file  Other Topics Concern  . Not on file  Social History Narrative  . Not on file    Allergies  Allergen Reactions  .  Sulfonamide Derivatives Hives    Family History  Problem Relation Age of Onset  . Asthma Mother   . COPD Mother   . Anxiety disorder Mother   . Diabetes Mother   . Depression Mother   . Hypertension Mother     Prior to Admission medications   Medication Sig Start Date End Date Taking? Authorizing Provider  albuterol (PROVENTIL,VENTOLIN) 90 MCG/ACT inhaler Inhale 2 puffs into the lungs every 6 (six) hours as needed for wheezing. 04/10/11 04/09/14  Velvet Bathe, MD  aspirin-acetaminophen-caffeine (EXCEDRIN MIGRAINE) [phone] MG tablet Take 1 tablet by mouth.    [provider]  atorvastatin (LIPITOR) 20 MG tablet Take 20 mg by mouth. 11/11/14 06/22/17  [provider]  baclofen (LIORESAL) 10 MG tablet Take by mouth. 04/23/18 04/23/19  [provider]  diazepam (VALIUM) 5 MG tablet Take 1 tablet 30 minutes prior to procedure - may repeat x1 05/15/18   Hudnall, Sharyn Lull, MD  FLUoxetine (PROZAC) 20 MG tablet Take 60 mg by mouth every morning.     [provider]  gabapentin (NEURONTIN) 300 MG capsule Take 600 mg by mouth 3 (three) times daily.    [provider]  ibuprofen (ADVIL,MOTRIN) 200 MG tablet Take 600 mg by mouth every 6 (six) hours as needed for headache or mild pain.    [provider]  Melatonin 10 MG TABS Take 10 mg by mouth at bedtime.    [provider]  Multiple Vitamins-Minerals (THERA-M) TABS Take by mouth.    [provider]  predniSONE (DELTASONE) 10 MG tablet 6 tabs po day 1, 5 tabs po day 2, 4 tabs po day 3, 3 tabs po day 4, 2 tabs po day 5, 1 tab po day 6 05/03/18   Hudnall, Sharyn Lull, MD  QUEtiapine (SEROQUEL) 300 MG tablet Take by mouth. 04/23/18   [provider]  traZODone (DESYREL) 50 MG tablet Take 50-100 mg by mouth.     [provider]    Physical Exam: Vitals:   05/23/18 1508  BP: (!) 173/97  Pulse: 89  Resp: 18  Temp: 99 F (37.2 C)  TempSrc: Oral  SpO2: 96%      . General:  Appears calm and comfortable and is NAD; she appears older than stated age . Eyes:   EOMI, normal lids, iris . ENT:  grossly normal hearing, lips & tongue, mmm; very poor dentition with multiple apparent  erosions/abscesses . Neck:  no LAD, masses or thyromegaly; no carotid bruits . Cardiovascular:  RRR, no r/g, 3/6 systolic murmur that patient reports is chronic. No LE edema.  Marland Kitchen Respiratory:   CTA bilaterally with no wheezes/rales/rhonchi.  Normal respiratory effort. . Abdomen:  soft, NT, ND, NABS . Back:   normal alignment, no CVAT, mild LS TTP . Skin:  no rash or induration seen on limited exam . Musculoskeletal:  grossly normal tone BUE/BLE, good ROM, no bony abnormality . Psychiatric:  grossly normal mood and affect, speech fluent and appropriate, AOx3 . Neurologic:  CN 2-12 grossly intact, moves all extremities in coordinated fashion, sensation intact    Radiological Exams on Admission: No results found.  EKG: not done   Labs on Admission: I have personally reviewed the available labs and imaging studies at the time of the admission.  Pertinent labs:   ESR 87 CRP 32 Normal CBC other than Hgb 11.7  Assessment/Plan Principal Problem:   Acute osteomyelitis of lumbar spine (HCC) Active Problems:   TOBACCO ABUSE   Depression with anxiety   Opioid dependence in remission (HCC)   HLD (hyperlipidemia)   Essential hypertension   Marijuana abuse   Poor dentition   Acute lumbar osteomyelitis -Patient presenting with acute onset of back pain after dental abscess; found by MRI to have osteomyelitis with spontaneous septic facet at L3/4. -Concerning lab findings include CRP 32, elevated ESR (87; goal is < 1/2 age +22 if female).  WBC is often <15 in osteo and hers is normal. -Will need antibiotics for 4-6 weeks. -With vertebral osteo, needs testing for TB; quantiferon gold ordered. -Most likely source is poor dentition, which may need to be addressed as  inpatient based on severity of disease and current infection. -She has h/o substance abuse and this is also a possible source. -Further important considerations include nutrition, tobacco cessation. -Vertebral osteo is more common in the L-spine and usually presents with severe pain. -Will treat with PO oxy IR and IV Toradol after obtaining UDS. -Blood cultures pending; If bacteremia, will need echo and likely TEE. -IR consult requested for facet joint aspiration; after culture has been obtained, can begin antibiotic treatment. -Long-term antibiotic treatment may be difficult in this patient with h/o opioid dependence and she may benefit from SNF placement for completion of therapy.  HLD -Continue Lipitor  H/o Opioid dependence -As mentioned above, this complicates potential treatment options. -Would need to be very judicious with use of PICC line; oral agent or SNF placement would be preferred. -Will attempt to minimize habit-forming medications.  Depression with anxiety -Patient with significant psych history including SI -Continue home meds - Prozac, Seroquel, Trazodone, Melatonin  Marijuana abuse -Cessation encouraged; this should be encouraged on an ongoing basis -UDS ordered  Poor dentition -Very poor dentition -May benefit from OMFS consult while inpatient due to the severity of the disease  Tobacco dependence -Encourage cessation.  She is now vaping. -This was discussed with the patient and should be reviewed on an ongoing basis.   -Patch ordered at patient request.   DVT prophylaxis:  SCDs pending IR drainage Code Status:  Full - confirmed with patient/family Family Communication: Mother present throughout evaluation  Disposition Plan:  Home once clinically improved Consults called: Neurosurgery  Admission status: Admit - It is my clinical opinion that admission to INPATIENT is reasonable and necessary because of the expectation that this patient will require hospital  care that crosses at least 2 midnights to treat this condition based  on the medical complexity of the problems presented.  Given the aforementioned information, the predictability of an adverse outcome is felt to be significant.     Karmen Bongo MD [facility]   How to contact the [facility] Attending or Consulting provider [place] or covering provider during after hours [place], for this patient?  1. Check the care team in [facility] and look for a) attending/consulting [facility] provider listed and b) the [facility] team listed 2. Log into [email or web address] and use [place]'s universal password to access. If you do not have the password, please contact the hospital operator. 3. Locate the [facility] provider you are looking for under [facility] and page to a number that you can be directly reached. 4. If you still have difficulty reaching the provider, please page the [facility] (Director on Call) for the Hospitalists listed on amion for assistance.   05/23/2018, 4:29 PM

## 2018-05-23 NOTE — Plan of Care (Signed)
Pt A&O x 4, endorses pain 7/10 to lower back, PRN pain medication administered with pt noted to be asleep after 1 hour of administration. Admission completed, 22g in LFA initiated. POC d/w pt, including safety and NPO status at MN. Pt currently resting with eyes closed, no needs noted at this time.

## 2018-05-24 ENCOUNTER — Inpatient Hospital Stay (HOSPITAL_COMMUNITY): Payer: Medicare Other

## 2018-05-24 LAB — BASIC METABOLIC PANEL
ANION GAP: 8 (ref 5–15)
BUN: 10 mg/dL (ref 6–20)
CO2: 27 mmol/L (ref 22–32)
Calcium: 9.3 mg/dL (ref 8.9–10.3)
Chloride: 103 mmol/L (ref 98–111)
Creatinine, Ser: 0.78 mg/dL (ref 0.44–1.00)
GFR calc Af Amer: >60 mL/min (ref >60)
GFR calc non Af Amer: >60 mL/min (ref >60)
Glucose, Bld: 97 mg/dL (ref 70–99)
POTASSIUM: 4 mmol/L (ref 3.5–5.1)
Sodium: 138 mmol/L (ref 135–145)

## 2018-05-24 LAB — PROTIME-INR
INR: 0.96
Prothrombin Time: 12.7 seconds (ref 11.4–15.2)

## 2018-05-24 LAB — CBC
HCT: 37 % (ref 36.0–46.0)
HEMOGLOBIN: 11.6 g/dL — AB (ref 12.0–15.0)
MCH: 27 pg (ref 26.0–34.0)
MCHC: 31.4 g/dL (ref 30.0–36.0)
MCV: 86.2 fL (ref 80.0–100.0)
Platelets: 362 10*3/uL (ref 150–400)
RBC: 4.29 MIL/uL (ref 3.87–5.11)
RDW: 13.8 % (ref 11.5–15.5)
WBC: 5.7 10*3/uL (ref 4.0–10.5)
nRBC: 0 % (ref 0.0–0.2)

## 2018-05-24 LAB — HIV ANTIBODY (ROUTINE TESTING W REFLEX): HIV Screen 4th Generation wRfx: NONREACTIVE

## 2018-05-24 MED ORDER — FENTANYL CITRATE (PF) 100 MCG/2ML IJ SOLN
INTRAMUSCULAR | Status: AC
  Filled 2018-05-24: qty 2

## 2018-05-24 MED ORDER — MIDAZOLAM HCL 2 MG/2ML IJ SOLN
INTRAMUSCULAR | Status: AC
  Filled 2018-05-24: qty 2

## 2018-05-24 MED ORDER — FENTANYL CITRATE (PF) 100 MCG/2ML IJ SOLN
INTRAMUSCULAR | Status: AC | PRN
  Administered 2018-05-24: 50 ug via INTRAVENOUS
  Administered 2018-05-24: 25 ug via INTRAVENOUS

## 2018-05-24 MED ORDER — MIDAZOLAM HCL 2 MG/2ML IJ SOLN
INTRAMUSCULAR | Status: AC | PRN
  Administered 2018-05-24: 0.5 mg via INTRAVENOUS
  Administered 2018-05-24: 1 mg via INTRAVENOUS

## 2018-05-24 NOTE — Plan of Care (Signed)
Patient stable, discussed POC with patient, agreeable with plan, denies question/concerns at this time.  

## 2018-05-24 NOTE — Consult Note (Signed)
Chief Complaint: Patient was seen in consultation today for Lumbar 3-4 facet aspiration at the request of Dr Burman Foster   Supervising Physician: Jolaine Click  Patient Status: [facility] - In-pt  History of Present Illness: Mary Ewing is a 48 y.o. female   Hx substance abuse Has had LBP since Cmas eve. Intermittent fevers Worsening pain  Presented to MD with lumbar osteoomyelitis and back pain Direct admit  MRI 1/26:  IMPRESSION: 1. First of all, the numbering system is being revised from the prior MRI, such that L5 is the partially sacralized vertebra at the lumbosacral junction. This is felt to more accurately fit the patient's anatomic situation of 12 thoracic ribs and 5 lumbar type vertebra, the lowest of which is sacralized. 2. Abnormal new facet effusion on the right at L3-4 with extensive perifacet and paraspinal edema, a nearly 1 cc fluid collection extending posteriorly and superiorly from the facet joint, considerable marrow edema extending into the pedicles, and also infiltration/effacement of the adjacent epidural adipose tissues along the right side of the thecal sac and in the right lateral recess at the intervertebral level. There is a suggestion of a small fluid collection in the right lateral recess which could be a synovial cyst or abscess. Based on these findings, septic facet joint on the right at L3-4 is a distinct possibility. No definite confirmatory erosive findings along the facets. Correlate with the patient's symptoms and any clinical indicators of infection in determining appropriate next steps. There does appear to be mild right foraminal and mild right subarticular lateral recess stenosis at L3-4 partially due to the small fluid collection in the right lateral recess and partially from facet arthropathy  Request for L3-4 facet collection aspiration Dr Bonnielee Haff has reviewed imaging and approves procedure  Past Medical History:  Diagnosis Date  .  Acute renal failure (HCC)   . Anxiety   . Asthmatic bronchitis    history of  . Depression   . DJD (degenerative joint disease)   . GERD (gastroesophageal reflux disease)   . H/O: suicide attempt    x 3  . High cholesterol   . Iron deficiency anemia   . Substance abuse [facility])     Past Surgical History:  Procedure Laterality Date  . APPENDECTOMY  2004  . CESAREAN SECTION    . MOUTH SURGERY     teeth removed  . REFRACTIVE SURGERY    . right tympanoplasty  1999  . TONSILLECTOMY      Allergies: Sulfonamide derivatives  Medications: Prior to Admission medications   Medication Sig Start Date End Date Taking? Authorizing Provider  albuterol (PROVENTIL,VENTOLIN) 90 MCG/ACT inhaler Inhale 2 puffs into the lungs every 6 (six) hours as needed for wheezing. 04/10/11 04/09/14  Penny Pia, MD  aspirin-acetaminophen-caffeine (EXCEDRIN MIGRAINE) [phone] MG tablet Take 1 tablet by mouth every 8 (eight) hours as needed for headache.     [provider]  atorvastatin (LIPITOR) 20 MG tablet Take 20 mg by mouth daily at 6 PM.  11/11/14 06/22/17  [provider]  baclofen (LIORESAL) 10 MG tablet Take 5 mg by mouth 2 (two) times daily.  04/23/18 04/23/19  [provider]  diazepam (VALIUM) 5 MG tablet Take 1 tablet 30 minutes prior to procedure - may repeat x1 05/15/18   Hudnall, Azucena Fallen, MD  FLUoxetine (PROZAC) 20 MG tablet Take 60 mg by mouth every morning.     [provider]  gabapentin (NEURONTIN) 300 MG capsule Take 600 mg  by mouth 3 (three) times daily.    [provider]  ibuprofen (ADVIL,MOTRIN) 200 MG tablet Take 600 mg by mouth every 6 (six) hours as needed for headache or mild pain.    [provider]  Melatonin 10 MG TABS Take 10 mg by mouth at bedtime.    [provider]  Multiple Vitamins-Minerals (THERA-M) TABS Take 1 tablet by mouth daily.     [provider]  QUEtiapine (SEROQUEL) 300 MG tablet Take 300 mg by  mouth at bedtime.  04/23/18   [provider]  traZODone (DESYREL) 50 MG tablet Take 50-100 mg by mouth at bedtime as needed for sleep.     [provider]     Family History  Problem Relation Age of Onset  . Asthma Mother   . COPD Mother   . Anxiety disorder Mother   . Diabetes Mother   . Depression Mother   . Hypertension Mother     Social History   Socioeconomic History  . Marital status: Single    Spouse name: Not on file  . Number of children: Not on file  . Years of education: Not on file  . Highest education level: Not on file  Occupational History  . Occupation: disability  Social Needs  . Financial resource strain: Not on file  . Food insecurity:    Worry: Not on file    Inability: Not on file  . Transportation needs:    Medical: Not on file    Non-medical: Not on file  Tobacco Use  . Smoking status: Current Every Day Smoker    Packs/day: 1.00    Years: 24.00    Pack years: 24.00    Types: Cigarettes  . Smokeless tobacco: Never Used  . Tobacco comment: needs a patch  Substance and Sexual Activity  . Alcohol use: Yes    Alcohol/week: 0.0 standard drinks    Comment: occ  . Drug use: Yes    Types: Cocaine, Marijuana    Comment: prescription drugs;last use of marijuana was this AM, denies cocaine   . Sexual activity: Never    Birth control/protection: None  Lifestyle  . Physical activity:    Days per week: Not on file    Minutes per session: Not on file  . Stress: Not on file  Relationships  . Social connections:    Talks on phone: Not on file    Gets together: Not on file    Attends religious service: Not on file    Active member of club or organization: Not on file    Attends meetings of clubs or organizations: Not on file    Relationship status: Not on file  Other Topics Concern  . Not on file  Social History Narrative  . Not on file     Review of Systems: A 12 point ROS discussed and pertinent positives are indicated in  the HPI above.  All other systems are negative.  Review of Systems  Constitutional: Positive for activity change, appetite change, fatigue and fever.  Respiratory: Negative for shortness of breath.   Cardiovascular: Negative for chest pain.  Musculoskeletal: Positive for back pain and gait problem.  Neurological: Positive for weakness.  Psychiatric/Behavioral: Negative for behavioral problems and confusion.    Vital Signs: BP (!) 115/55 (BP Location: Left Arm)   Pulse 65   Temp 97.7 F (36.5 C) (Oral)   Resp 18   Ht 5\' 2"  (1.575 m)   Wt 180 lb (81.6  kg)   SpO2 98%   BMI 32.92 kg/m   Physical Exam Vitals signs reviewed.  Cardiovascular:     Rate and Rhythm: Normal rate and regular rhythm.     Heart sounds: Normal heart sounds.  Pulmonary:     Breath sounds: Normal breath sounds.  Abdominal:     Palpations: Abdomen is soft.     Tenderness: There is no abdominal tenderness.  Musculoskeletal: Normal range of motion.     Comments: Low back pain  Skin:    General: Skin is warm and dry.  Neurological:     General: No focal deficit present.     Mental Status: She is oriented to person, place, and time.  Psychiatric:        Mood and Affect: Mood normal.        Behavior: Behavior normal.        Thought Content: Thought content normal.        Judgment: Judgment normal.     Imaging: Mr Lumbar Spine Wo Contrast  Addendum Date: 05/19/2018   ADDENDUM REPORT: 05/19/2018 17:44 ADDENDUM: The original report was by Dr. Gaylyn Rong. The following addendum is by Dr. Gaylyn Rong: We have had some difficulty contacting the on-call provider for Dr. Lazaro Arms office. Several messages were left but have not been returned, possibly from some sort of malfunction in the answering service. In order to avert the unexpected possibility of any severe issue overnight, I called Ms. Mccluskey on her cellphone at approximately 5:30 p.m. on 05/19/2018 and we discussed the findings of an  inflamed joint in her spine, along with the possibility of infection. She indicated that she believes she has had some fevers over the last several weeks and that her pain has been increasing. I told her that given the duration of for symptoms, unless she has significant fever tonight or significant progression of her back pain/weakness, it is likely safe to wait and speak to Dr. Lazaro Arms office in the morning once we have had the opportunity to call the report. However, I cautioned her several times that were she to experience severe worsening of the back pain, significant fevers, or new weakness in the right lower extremity that those would be reasons to proceed to a local emergency room immediately. She understood and was agreeable to this course of action. Our current plan is to phone the report impressions to Dr. Lazaro Arms office at earliest opportunity in the morning once they his office opens. Electronically Signed   By: Gaylyn Rong M.D.   On: 05/19/2018 17:44   Result Date: 05/19/2018 CLINICAL DATA:  Bilateral low back pain, weakness, numbness in legs since December 2019 EXAM: MRI LUMBAR SPINE WITHOUT CONTRAST TECHNIQUE: Multiplanar, multisequence MR imaging of the lumbar spine was performed. No intravenous contrast was administered. COMPARISON:  Multiple exams, including 05/09/2015 MRI FINDINGS: Segmentation: Correlating with prior CT abdomen and chest radiographs, there are 12 paired thoracic ribs, along with 5 lumbar type non-rib-bearing vertebra, the lowest of which is partially sacralized. Accordingly, the sacralized vertebra is believed to represent L5. I will be changing the numbering strategy from the prior lumbar spine MRI which had previously labeled this vertebra as S1. To reiterate, for today's exam the partially sacralized lowest lumbar vertebra is L5. Alignment:  No vertebral subluxation is observed. Vertebrae: Right facet and perifacet edema at the L3-4 level, significantly worsened  from 05/09/2015, with associated edema extending into the pedicles at both vertebral levels, a with a small fluid collection measuring 3.1  by 0.7 by 0.7 cm (volume = 0.8 cm^3) extending posteriorly and superiorly from the facet joint. Presumably this represents a synovial cyst. There is edema in the adjacent paraspinal tissues and a right facet joint effusion is present at this level. There is subtle adjacent interspinous edema at L3-4 as well. Conus medullaris and cauda equina: Conus extends to the T12-L1 level. Conus and cauda equina appear normal. Paraspinal and other soft tissues: Paraspinal edema on the right around the L3-4 facet joint as noted above. Otherwise unremarkable. Disc levels: T12-L1: Unremarkable. L1-2: Unremarkable. L2-3: Unremarkable. L3-4: There is abnormal low T1 signal infiltrating the epidural adipose tissue along the right side of the thecal sac and right lateral recess as shown on images 29 through 31 of series 8. Presumably this is associated with the facet arthropathy. A CT exclude a small synovial cyst along the medial margin of the facet extending into the lateral recess on image 29/7. A small epidural abscess in the lateral recess is a differential diagnostic consideration. No obvious cortical destruction along the facet joint. Mild right foraminal stenosis and right subarticular lateral recess stenosis due to facet arthropathy and the above findings. L4-5: Unremarkable L5-S1: Unremarkable.  Very small intervertebral disc. IMPRESSION: 1. First of all, the numbering system is being revised from the prior MRI, such that L5 is the partially sacralized vertebra at the lumbosacral junction. This is felt to more accurately fit the patient's anatomic situation of 12 thoracic ribs and 5 lumbar type vertebra, the lowest of which is sacralized. 2. Abnormal new facet effusion on the right at L3-4 with extensive perifacet and paraspinal edema, a nearly 1 cc fluid collection extending posteriorly  and superiorly from the facet joint, considerable marrow edema extending into the pedicles, and also infiltration/effacement of the adjacent epidural adipose tissues along the right side of the thecal sac and in the right lateral recess at the intervertebral level. There is a suggestion of a small fluid collection in the right lateral recess which could be a synovial cyst or abscess. Based on these findings, septic facet joint on the right at L3-4 is a distinct possibility. No definite confirmatory erosive findings along the facets. Correlate with the patient's symptoms and any clinical indicators of infection in determining appropriate next steps. There does appear to be mild right foraminal and mild right subarticular lateral recess stenosis at L3-4 partially due to the small fluid collection in the right lateral recess and partially from facet arthropathy. Radiology assistant personnel have been notified to put me in telephone contact with the referring physician or the referring physician's clinical representative in order to discuss these findings. Once this communication is established I will issue an addendum to this report for documentation purposes. Electronically Signed: By: Gaylyn RongWalter  Liebkemann M.D. On: 05/19/2018 15:15    Labs:  CBC: Recent Labs    05/23/18 1545  WBC 8.6  HGB 11.7*  HCT 37.3  PLT 366    COAGS: No results for input(s): INR, APTT in the last 8760 hours.  BMP: Recent Labs    05/23/18 1545  NA 136  K 3.9  CL 97*  CO2 26  GLUCOSE 100*  BUN 8  CALCIUM 9.3  CREATININE 1.10*  GFRNONAA 60*  GFRAA >60    LIVER FUNCTION TESTS: Recent Labs    05/23/18 1545  BILITOT 0.4  AST 23  ALT 15  ALKPHOS 90  PROT 7.9  ALBUMIN 3.7    TUMOR MARKERS: No results for input(s): AFPTM, CEA, CA199, CHROMGRNA  in the last 8760 hours.  Assessment and Plan:  Hx substance abuse Persistent/worsening Low back pain L3-4 facet collection noted on imaging For aspiration  today Risks and benefits discussed with the patient including, but not limited to bleeding, infection, damage to adjacent structures or low yield requiring additional tests.  All of the patient's questions were answered, patient is agreeable to proceed. Consent signed and in chart.   Thank you for this interesting consult.  I greatly enjoyed meeting Mary Ewing and look forward to participating in their care.  A copy of this report was sent to the requesting provider on this date.  Electronically Signed: Robet LeuPamela A Easten Maceachern, PA-C 05/24/2018, 8:43 AM   I spent a total of 40 Minutes    in face to face in clinical consultation, greater than 50% of which was counseling/coordinating care for L3-4 facet aspiration

## 2018-05-24 NOTE — Progress Notes (Signed)
PROGRESS NOTE    Mary Ewing  DQQ:[id number] DOB: 1970/08/21 DOA: 05/23/2018 PCP: Macy Mis, MD     Brief Narrative:  Mary Ewing is a 48 y.o. female with medical history significant of h/o substance abuse, HLD, and depression with SI presenting as a direct admission from Dr. Jordan Likes.  She woke up on Christmas Eve AM with terrible low back pain.  There was nothing she could do to make it better.  It has gotten progressively worse and her right leg has given out on her a few times, causing her to fall down.  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  She has never required surgery.  Intermittent fevers, as high as 102.8.  Her last fever was maybe a week ago. She has also had 2 abscessed teeth, including one a week or two before Christmas; she has not had them pulled but did get a couple of rounds of antibiotics. She presented to neurosurgery office with lumbar osteomyelitis with back pain, not surgical.  Admitted for facet joint aspiration and antibiotic treatment.  New events last 24 hours / Subjective: No acute events overnight.  Afebrile.  Assessment & Plan:   Principal Problem:   Acute osteomyelitis of lumbar spine (HCC) Active Problems:   TOBACCO ABUSE   Depression with anxiety   Opioid dependence in remission (HCC)   HLD (hyperlipidemia)   Essential hypertension   Marijuana abuse   Poor dentition   Acute lumbar osteomyelitis -IR consulted for facet joint aspiration -Antibiotic therapy not initiated until culture results available to guide treatment -Blood cultures pending   Poor dentition -Patient was unable to follow-up with an outpatient dentist due to financial constraints -No dental coverage available in hospital until 2/18  HLD -Continue Lipitor  History of opioid dependence -UDS positive for THC only   Depression with anxiety -Patient with significant psych history including SI -Continue Prozac, Seroquel, Trazodone  Tobacco  dependence -Encourage smoking cessation -Nicotine patch    DVT prophylaxis: SCD Code Status: Full code Family Communication: No family at bedside Disposition Plan: IR aspiration of facet joint, culture result, antibiotic recommendation   Consultants:   IR  Procedures:   None  Antimicrobials:  Anti-infectives (From admission, onward)   None       Objective: Vitals:   05/23/18 2346 05/24/18 0349 05/24/18 0829 05/24/18 1207  BP: (!) 100/57 (!) 101/58 (!) 115/55 134/80  Pulse: 72 64 65 67  Resp: 17 17 18 19   Temp: 97.6 F (36.4 C) (!) 97.5 F (36.4 C) 97.7 F (36.5 C) 98.7 F (37.1 C)  TempSrc: Oral Oral Oral Oral  SpO2: 97% 98% 98% 100%  Weight:      Height:       No intake or output data in the 24 hours ending 05/24/18 1344 Filed Weights   05/23/18 2030  Weight: 81.6 kg    Examination:  General exam: Appears calm and comfortable  ENT: Poor dentition  Respiratory system: Clear to auscultation. Respiratory effort normal. Cardiovascular system: S1 & S2 heard, RRR. No JVD, murmurs, rubs, gallops or clicks. No pedal edema. Gastrointestinal system: Abdomen is nondistended, soft and nontender. No organomegaly or masses felt. Normal bowel sounds heard. Central nervous system: Alert and oriented. No focal neurological deficits. Extremities: Symmetric 5 x 5 power. Skin: No rashes, lesions or ulcers Psychiatry: Judgement and insight appear normal. Mood & affect appropriate.   Data Reviewed: I have personally reviewed following labs and imaging studies  CBC: Recent Labs  Lab 05/23/18 1545 05/24/18 0832  WBC 8.6 5.7  NEUTROABS 5.8  --   HGB 11.7* 11.6*  HCT 37.3 37.0  MCV 85.4 86.2  PLT 366 362   Basic Metabolic Panel: Recent Labs  Lab 05/23/18 1545 05/24/18 0832  NA 136 138  K 3.9 4.0  CL 97* 103  CO2 26 27  GLUCOSE 100* 97  BUN 8 10  CREATININE 1.10* 0.78  CALCIUM 9.3 9.3   GFR: Estimated Creatinine Clearance: 86 mL/min (by C-G formula based  on SCr of 0.78 mg/dL). Liver Function Tests: Recent Labs  Lab 05/23/18 1545  AST 23  ALT 15  ALKPHOS 90  BILITOT 0.4  PROT 7.9  ALBUMIN 3.7   No results for input(s): LIPASE, AMYLASE in the last 168 hours. No results for input(s): AMMONIA in the last 168 hours. Coagulation Profile: Recent Labs  Lab 05/24/18 0832  INR 0.96   Cardiac Enzymes: No results for input(s): CKTOTAL, CKMB, CKMBINDEX, TROPONINI in the last 168 hours. BNP (last 3 results) No results for input(s): PROBNP in the last 8760 hours. HbA1C: No results for input(s): HGBA1C in the last 72 hours. CBG: No results for input(s): GLUCAP in the last 168 hours. Lipid Profile: No results for input(s): CHOL, HDL, LDLCALC, TRIG, CHOLHDL, LDLDIRECT in the last 72 hours. Thyroid Function Tests: No results for input(s): TSH, T4TOTAL, FREET4, T3FREE, THYROIDAB in the last 72 hours. Anemia Panel: No results for input(s): VITAMINB12, FOLATE, FERRITIN, TIBC, IRON, RETICCTPCT in the last 72 hours. Sepsis Labs: No results for input(s): PROCALCITON, LATICACIDVEN in the last 168 hours.  Recent Results (from the past 240 hour(s))  Culture, blood (routine x 2)     Status: None (Preliminary result)   Collection Time: 05/23/18  4:17 PM  Result Value Ref Range Status   Specimen Description BLOOD RIGHT ANTECUBITAL  Final   Special Requests   Final    BOTTLES DRAWN AEROBIC AND ANAEROBIC Blood Culture adequate volume   Culture   Final    NO GROWTH < 24 HOURS Performed at [facility], [address] [place], [place] [address]    Report Status PENDING  Incomplete  Culture, blood (routine x 2)     Status: None (Preliminary result)   Collection Time: 05/23/18  4:26 PM  Result Value Ref Range Status   Specimen Description BLOOD LEFT HAND  Final   Special Requests   Final    BOTTLES DRAWN AEROBIC AND ANAEROBIC Blood Culture adequate volume   Culture   Final    NO GROWTH < 24 HOURS Performed at [facility], [address] [place], [place] [address]    Report Status PENDING  Incomplete       Radiology Studies: No results found.    Scheduled Meds: . atorvastatin  20 mg Oral q1800  . baclofen  5 mg Oral BID  . docusate sodium  100 mg Oral BID  . FLUoxetine  60 mg Oral q morning - 10a  . gabapentin  600 mg Oral TID  . Melatonin  9 mg Oral QHS  . nicotine  14 mg Transdermal Daily  . QUEtiapine  300 mg Oral QHS   Continuous Infusions:   LOS: 1 day    Time spent: 35 minutes   Noralee Stain, DO [facility] [email or web address] 05/24/2018, 1:44 PM

## 2018-05-24 NOTE — Progress Notes (Signed)
Initial Nutrition Assessment  DOCUMENTATION CODES:   Obesity unspecified  INTERVENTION:  Once diet advances: -Magic cup TID with meals, each supplement provides 290 kcal and 9 grams of protein  -Educated about foods higher in protein to assist in healing.  NUTRITION DIAGNOSIS:   Increased nutrient needs related to acute illness(osteomyelitis) as evidenced by estimated needs.  GOAL:   Patient will meet greater than or equal to 90% of their needs  MONITOR:   PO intake, Diet advancement  REASON FOR ASSESSMENT:   Consult Assessment of nutrition requirement/status  ASSESSMENT:   Pt with PMH of substance abused, HLD, depression, and GERD. Pt also had 2 abscessed teeth that were not pulled but she was on antibiotics. Presented with acute lumbar osteomyelitis.   Spoke with pt at bedside; she was awake and pleasant. Currently experiencing some pain d/t her back.   Pt reports a normal intake of 2 meals and snacks in between. Her mom is the one usually preparing the meals d/t the amount of pain she is in when she moves. She wakes up early AM and has her first meal around 11-Noon. Her first meal is often leftovers from dinner, soups, sandwiches, or cereal. Her next meal is around 5-7pm and consists of frozen meals, fast food, or easy to cook meals like chicken breasts. She mentioned that her mother's health is worsening so it is hard on both of them to fix full meals. Pt enjoys having clementines for snacks. Will drink 2-3 16oz Coke during the day.  Pt does have problems ambulating d/t the osteomyelitis. She said last week she went to get groceries and was in a great amount of pain doing that. Pt only has front teeth and still has the 2 abscessed teeth; is trying to gather the funds to have her teeth pulled and dentures made. No n/v, does have to chew longer or take smaller bites.    Recommended cutting down to 1-2 Cokes a day and to begin to incorporate high protein foods to assist with  healing. Pt is slightly reluctant to have supplements, but once diet advances she will try Magic Cups on her meal tray. Encouraged pt to choose softer foods if she feels SOB or weak after eating.   Medications reviewed and include: Lipitor 20mg  Labs reviewed.  NUTRITION - FOCUSED PHYSICAL EXAM:    Most Recent Value  Orbital Region  No depletion  Upper Arm Region  No depletion  Thoracic and Lumbar Region  No depletion  Buccal Region  No depletion  Temple Region  No depletion  Clavicle Bone Region  No depletion  Clavicle and Acromion Bone Region  No depletion  Scapular Bone Region  No depletion  Dorsal Hand  No depletion  Patellar Region  No depletion  Anterior Thigh Region  No depletion  Posterior Calf Region  No depletion  Edema (RD Assessment)  None  Hair  Reviewed  Eyes  Reviewed  Mouth  Reviewed  Skin  Reviewed  Nails  Reviewed     Diet Order:   Diet Order            Diet NPO time specified Except for: Sips with Meds  Diet effective midnight             EDUCATION NEEDS:   Education needs have been addressed  Skin:  Skin Assessment: Reviewed RN Assessment  Last BM:  1/29  Height:   Ht Readings from Last 1 Encounters:  05/23/18 5\' 2"  (1.575 m)  Weight:   Wt Readings from Last 1 Encounters:  05/23/18 81.6 kg    Ideal Body Weight:  50 kg  BMI:  Body mass index is 32.92 kg/m.  Estimated Nutritional Needs:   Kcal:  1500-1700 kcal  Protein:  80-90 g  Fluid:  >/= 1.5L    [employer] Dietetic Intern

## 2018-05-24 NOTE — Procedures (Signed)
R L3/4 facet aspiration Two drops bloody fluid EBL 0 Comp 0

## 2018-05-25 MED ORDER — OXYCODONE HCL 5 MG PO TABS
5.0000 mg | ORAL_TABLET | ORAL | Status: DC | PRN
  Administered 2018-05-25 – 2018-05-29 (×19): 5 mg via ORAL
  Filled 2018-05-25 (×20): qty 1

## 2018-05-25 MED ORDER — TRAMADOL HCL 50 MG PO TABS
50.0000 mg | ORAL_TABLET | Freq: Four times a day (QID) | ORAL | Status: DC | PRN
  Administered 2018-05-25 – 2018-05-29 (×7): 50 mg via ORAL
  Filled 2018-05-25 (×8): qty 1

## 2018-05-25 NOTE — Evaluation (Signed)
Physical Therapy Evaluation Patient Details Name: Mary Ewing MRN: [id number] DOB: August 11, 1970 Today's Date: 05/25/2018   History of Present Illness  Pt is a 48 y/o female admitted secondary to back pain and found to have acute lumbar osteomyelitis. PMH including but not limited to substance abuse, HLD, and depression with SI.    Clinical Impression  Pt presented supine in bed with HOB elevated, awake and willing to participate in therapy session. Prior to admission, pt reported that she ambulated with a cane PRN and was independent with ADLs. Pt lives with her mother and child in a single level home with a level entry. She is currently at supervision level overall for mobility without use of an AD. Pt limited secondary to pain; however, tolerated evaluation well. PT will continue to follow acutely to progress mobility as tolerated.     Follow Up Recommendations No PT follow up    Equipment Recommendations  None recommended by PT    Recommendations for Other Services       Precautions / Restrictions Precautions Precautions: Fall Restrictions Weight Bearing Restrictions: No      Mobility  Bed Mobility Overal bed mobility: Modified Independent                Transfers Overall transfer level: Modified independent Equipment used: None                Ambulation/Gait Ambulation/Gait assistance: Supervision Gait Distance (Feet): 100 Feet Assistive device: None Gait Pattern/deviations: Step-through pattern Gait velocity: decreased   General Gait Details: pt steady without use of an AD, no LOB or need for physical assistance, limited secondary to pain  Stairs            Wheelchair Mobility    Modified Rankin (Stroke Patients Only)       Balance Overall balance assessment: Needs assistance Sitting-balance support: Feet supported Sitting balance-Leahy Scale: Good     Standing balance support: No upper extremity supported Standing balance-Leahy  Scale: Good                               Pertinent Vitals/Pain Pain Assessment: 0-10 Pain Score: 7  Pain Location: back Pain Descriptors / Indicators: Guarding Pain Intervention(s): Monitored during session;Repositioned;Patient requesting pain meds-RN notified;RN gave pain meds during session    Home Living Family/patient expects to be discharged to:: Private residence Living Arrangements: Parent;Children Available Help at Discharge: Family;Available PRN/intermittently Type of Home: House Home Access: Level entry     Home Layout: One level Home Equipment: Cane - single point      Prior Function Level of Independence: Independent with assistive device(s)         Comments: pt recently ambulating with cane when outside of the home, independent inside     Hand Dominance        Extremity/Trunk Assessment   Upper Extremity Assessment Upper Extremity Assessment: Overall WFL for tasks assessed    Lower Extremity Assessment Lower Extremity Assessment: Overall WFL for tasks assessed       Communication   Communication: No difficulties  Cognition Arousal/Alertness: Awake/alert Behavior During Therapy: WFL for tasks assessed/performed Overall Cognitive Status: Within Functional Limits for tasks assessed                                        General Comments  Exercises     Assessment/Plan    PT Assessment Patient needs continued PT services  PT Problem List Decreased mobility;Decreased knowledge of precautions;Pain       PT Treatment Interventions DME instruction;Gait training;Stair training;Functional mobility training;Therapeutic activities;Therapeutic exercise;Balance training;Neuromuscular re-education;Patient/family education    PT Goals (Current goals can be found in the Care Plan section)  Acute Rehab PT Goals Patient Stated Goal: decrease pain PT Goal Formulation: With patient Time For Goal Achievement:  06/08/18 Potential to Achieve Goals: Good    Frequency Min 3X/week   Barriers to discharge        Co-evaluation               AM-PAC PT "6 Clicks" Mobility  Outcome Measure Help needed turning from your back to your side while in a flat bed without using bedrails?: None Help needed moving from lying on your back to sitting on the side of a flat bed without using bedrails?: None Help needed moving to and from a bed to a chair (including a wheelchair)?: None Help needed standing up from a chair using your arms (e.g., wheelchair or bedside chair)?: None Help needed to walk in hospital room?: None Help needed climbing 3-5 steps with a railing? : A Little 6 Click Score: 23    End of Session   Activity Tolerance: Patient limited by pain Patient left: in chair;with call bell/phone within reach Nurse Communication: Mobility status PT Visit Diagnosis: Other abnormalities of gait and mobility (R26.89);Pain Pain - part of body: (back)    Time: 8280-0349 PT Time Calculation (min) (ACUTE ONLY): 23 min   Charges:   PT Evaluation $PT Eval Moderate Complexity: 1 Mod PT Treatments $Gait Training: 8-22 mins        Deborah Chalk, PT, DPT  Acute Rehabilitation Services Pager [phone] Office [phone]    Alessandra Bevels Alfred Harrel 05/25/2018, 3:52 PM

## 2018-05-25 NOTE — Progress Notes (Signed)
Occupational Therapy Evaluation Patient Details Name: Mary Ewing MRN: [id number] DOB: 16-Feb-1971 Today's Date: 05/25/2018    History of Present Illness Pt is a 48 y/o female admitted secondary to back pain and found to have acute lumbar osteomyelitis. PMH including but not limited to substance abuse, HLD, and depression with SI.   Clinical Impression   PTA, pt reports she was living at home with her son and mother. Pt reports she was independent with ADL and functional mobility with use of spc PRN, but significantly limited secondary to pain. Pt currently requires S for functional mobility with ADL completion. Pt tolerated evaluation well and very willing to participate; however pt notably limited in participation secondary to pain. Pt expressed interest in utilizing AE to assist with pain management. Will continue to follow acutely and progress as tolerated to maximize safety and independence with ADL and functional mobility to allow d/c home.     Follow Up Recommendations  No OT follow up    Equipment Recommendations       Recommendations for Other Services       Precautions / Restrictions Precautions Precautions: Fall Restrictions Weight Bearing Restrictions: No      Mobility Bed Mobility Overal bed mobility: Modified Independent             General bed mobility comments: pt sitting in recliner upon arrival  Transfers Overall transfer level: Modified independent Equipment used: None                  Balance Overall balance assessment: Needs assistance Sitting-balance support: Feet supported Sitting balance-Leahy Scale: Good     Standing balance support: No upper extremity supported Standing balance-Leahy Scale: Good                             ADL either performed or assessed with clinical judgement   ADL Overall ADL's : Needs assistance/impaired     Grooming: Supervision/safety;Standing   Upper Body Bathing: Supervision/  safety;Standing   Lower Body Bathing: Supervison/ safety;Sit to/from stand   Upper Body Dressing : Supervision/safety;Standing   Lower Body Dressing: Supervision/safety;Sit to/from stand   Toilet Transfer: Supervision/safety;Ambulation   Toileting- Clothing Manipulation and Hygiene: Supervision/safety;Sit to/from stand         General ADL Comments: pt S with ADL completion, limited in involvement secondary to pain. discussed possible use of AE to assist with pain management     Vision Baseline Vision/History: No visual deficits       Perception     Praxis      Pertinent Vitals/Pain Pain Assessment: 0-10 Pain Score: 7  Pain Location: back Pain Descriptors / Indicators: Guarding Pain Intervention(s): Monitored during session     Hand Dominance Right   Extremity/Trunk Assessment Upper Extremity Assessment Upper Extremity Assessment: Overall WFL for tasks assessed   Lower Extremity Assessment Lower Extremity Assessment: Overall WFL for tasks assessed       Communication Communication Communication: No difficulties   Cognition Arousal/Alertness: Awake/alert Behavior During Therapy: WFL for tasks assessed/performed Overall Cognitive Status: Within Functional Limits for tasks assessed                                     General Comments  pt with complaints of shoulder pain, educated pt on general UE exercises to maintain ROM    Exercises     Shoulder Instructions  Home Living Family/patient expects to be discharged to:: Private residence Living Arrangements: Parent;Children Available Help at Discharge: Family;Available PRN/intermittently Type of Home: House Home Access: Level entry     Home Layout: One level     Bathroom Shower/Tub: Chief Strategy Officer: Standard Bathroom Accessibility: No   Home Equipment: Cane - single point          Prior Functioning/Environment Level of Independence: Independent with  assistive device(s)        Comments: pt recently ambulating with cane when outside of the home, independent inside;reports difficulty with showering, reducing frequency of showers to 1x/wk. Stated driving exacerbates pain;reports of falling 1x outside states her leg just gave out        OT Problem List: Decreased activity tolerance;Impaired balance (sitting and/or standing);Decreased knowledge of use of DME or AE;Pain      OT Treatment/Interventions: DME and/or AE instruction;Therapeutic exercise;Patient/family education;Balance training    OT Goals(Current goals can be found in the care plan section) Acute Rehab OT Goals Patient Stated Goal: decrease pain OT Goal Formulation: With patient Time For Goal Achievement: 06/08/18 Potential to Achieve Goals: Good  OT Frequency: Min 1X/week   Barriers to D/C:            Co-evaluation              AM-PAC OT "6 Clicks" Daily Activity     Outcome Measure Help from another person eating meals?: None Help from another person taking care of personal grooming?: None Help from another person toileting, which includes using toliet, bedpan, or urinal?: None Help from another person bathing (including washing, rinsing, drying)?: None Help from another person to put on and taking off regular upper body clothing?: None Help from another person to put on and taking off regular lower body clothing?: None 6 Click Score: 24   End of Session Nurse Communication: Mobility status  Activity Tolerance: Patient limited by pain Patient left: in chair;with call bell/phone within reach  OT Visit Diagnosis: Other abnormalities of gait and mobility (R26.89);Pain;History of falling (Z91.81) Pain - part of body: (back)                Time: 9390-3009 OT Time Calculation (min): 25 min Charges:  OT General Charges $OT Visit: 1 Visit OT Evaluation $OT Eval Low Complexity: 1 Low OT Treatments $Self Care/Home Management : 8-22 mins  Diona Browner  OTR/L Acute Rehabilitation Services Office: [phone]   Rebeca Alert 05/25/2018, 4:09 PM

## 2018-05-25 NOTE — Progress Notes (Signed)
PROGRESS NOTE    Mary AlarDana L Pau  ZOX:[id number] DOB: [birth year] DOA: 05/23/2018 PCP: Macy MisBriscoe, Kim K, MD     Brief Narrative:  Mary Ewing is a 48 y.o. female with medical history significant of h/o substance abuse, HLD, and depression with SI presenting as a direct admission from Dr. Jordan LikesPool.  She woke up on Christmas Eve AM with terrible low back pain.  There was nothing she could do to make it better.  It has gotten progressively worse and her right leg has given out on her a few times, causing her to fall down.  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  She has never required surgery.  Intermittent fevers, as high as 102.8.  Her last fever was maybe a week ago. She has also had 2 abscessed teeth, including one a week or two before Christmas; she has not had them pulled but did get a couple of rounds of antibiotics. She presented to neurosurgery office with lumbar osteomyelitis with back pain, not surgical.  Admitted for facet joint aspiration and antibiotic treatment.  New events last 24 hours / Subjective: Underwent fluid aspiration yesterday with IR 1/31.  No other acute events.  Continues to have low back pain.  Assessment & Plan:   Principal Problem:   Acute osteomyelitis of lumbar spine (HCC) Active Problems:   TOBACCO ABUSE   Depression with anxiety   Opioid dependence in remission (HCC)   HLD (hyperlipidemia)   Essential hypertension   Marijuana abuse   Poor dentition   Acute lumbar L3-4 fluid collection, concern for osteomyelitis -IR consulted for facet joint aspiration -Antibiotic therapy not initiated until culture results available to guide treatment -Blood cultures negative to date -Abscess culture from lumbar spine pending, Gram stain negative  Poor dentition -Patient was unable to follow-up with an outpatient dentist due to financial constraints -No dental coverage available in hospital until 2/18  HLD -Continue Lipitor  History of opioid  dependence -UDS positive for THC only   Depression with anxiety -Patient with significant psych history including SI -Continue Prozac, Seroquel, Trazodone  Tobacco dependence -Encourage smoking cessation -Nicotine patch    DVT prophylaxis: SCD Code Status: Full code Family Communication: No family at bedside Disposition Plan: Awaiting culture results to guide antibiotic therapy, PT OT pending   Consultants:   IR   Antimicrobials:  Anti-infectives (From admission, onward)   None       Objective: Vitals:   05/24/18 2005 05/24/18 2349 05/25/18 0336 05/25/18 0810  BP: 124/73 (!) 104/38 103/63 (!) 116/47  Pulse: 79 88 66 67  Resp: 17 17 18 16   Temp: 98.5 F (36.9 C) 98 F (36.7 C) 98 F (36.7 C) 98 F (36.7 C)  TempSrc: Oral Oral Oral Oral  SpO2: 99% 97% 96% 98%  Weight:      Height:       No intake or output data in the 24 hours ending 05/25/18 1014 Filed Weights   05/23/18 2030  Weight: 81.6 kg    Examination: General exam: Appears calm and comfortable  Respiratory system: Clear to auscultation. Respiratory effort normal. Cardiovascular system: S1 & S2 heard, RRR. No JVD, murmurs, rubs, gallops or clicks. No pedal edema. Gastrointestinal system: Abdomen is nondistended, soft and nontender. No organomegaly or masses felt. Normal bowel sounds heard. Central nervous system: Alert and oriented. No focal neurological deficits. Extremities: Symmetric Skin: No rashes, lesions or ulcers Psychiatry: Judgement and insight appear normal. Mood & affect appropriate.  Data Reviewed: I have personally reviewed following labs and imaging studies  CBC: Recent Labs  Lab 05/23/18 1545 05/24/18 0832  WBC 8.6 5.7  NEUTROABS 5.8  --   HGB 11.7* 11.6*  HCT 37.3 37.0  MCV 85.4 86.2  PLT 366 362   Basic Metabolic Panel: Recent Labs  Lab 05/23/18 1545 05/24/18 0832  NA 136 138  K 3.9 4.0  CL 97* 103  CO2 26 27  GLUCOSE 100* 97  BUN 8 10  CREATININE 1.10*  0.78  CALCIUM 9.3 9.3   GFR: Estimated Creatinine Clearance: 86 mL/min (by C-G formula based on SCr of 0.78 mg/dL). Liver Function Tests: Recent Labs  Lab 05/23/18 1545  AST 23  ALT 15  ALKPHOS 90  BILITOT 0.4  PROT 7.9  ALBUMIN 3.7   No results for input(s): LIPASE, AMYLASE in the last 168 hours. No results for input(s): AMMONIA in the last 168 hours. Coagulation Profile: Recent Labs  Lab 05/24/18 0832  INR 0.96   Cardiac Enzymes: No results for input(s): CKTOTAL, CKMB, CKMBINDEX, TROPONINI in the last 168 hours. BNP (last 3 results) No results for input(s): PROBNP in the last 8760 hours. HbA1C: No results for input(s): HGBA1C in the last 72 hours. CBG: No results for input(s): GLUCAP in the last 168 hours. Lipid Profile: No results for input(s): CHOL, HDL, LDLCALC, TRIG, CHOLHDL, LDLDIRECT in the last 72 hours. Thyroid Function Tests: No results for input(s): TSH, T4TOTAL, FREET4, T3FREE, THYROIDAB in the last 72 hours. Anemia Panel: No results for input(s): VITAMINB12, FOLATE, FERRITIN, TIBC, IRON, RETICCTPCT in the last 72 hours. Sepsis Labs: No results for input(s): PROCALCITON, LATICACIDVEN in the last 168 hours.  Recent Results (from the past 240 hour(s))  Culture, blood (routine x 2)     Status: None (Preliminary result)   Collection Time: 05/23/18  4:17 PM  Result Value Ref Range Status   Specimen Description BLOOD RIGHT ANTECUBITAL  Final   Special Requests   Final    BOTTLES DRAWN AEROBIC AND ANAEROBIC Blood Culture adequate volume   Culture NO GROWTH 2 DAYS  Final   Report Status PENDING  Incomplete  Culture, blood (routine x 2)     Status: None (Preliminary result)   Collection Time: 05/23/18  4:26 PM  Result Value Ref Range Status   Specimen Description BLOOD LEFT HAND  Final   Special Requests   Final    BOTTLES DRAWN AEROBIC AND ANAEROBIC Blood Culture adequate volume   Culture NO GROWTH 2 DAYS  Final   Report Status PENDING  Incomplete    Aerobic/Anaerobic Culture (surgical/deep wound)     Status: None (Preliminary result)   Collection Time: 05/24/18  3:11 PM  Result Value Ref Range Status   Specimen Description ABSCESS  Final   Special Requests NONE  Final   Gram Stain   Final    NO WBC SEEN NO ORGANISMS SEEN CYTOSPIN SMEAR Performed at [facility], [address] [place], [place] [address]    Culture PENDING  Incomplete   Report Status PENDING  Incomplete       Radiology Studies: Ct Aspiration  Result Date: 05/24/2018 INDICATION: Suspected septic right L3-4 facet arthritis EXAM: CT-GUIDED ASPIRATION MEDICATIONS: The patient is currently admitted to the hospital and receiving intravenous antibiotics. The antibiotics were administered within an appropriate time frame prior to the initiation of the procedure. ANESTHESIA/SEDATION: Fentanyl 75 mcg IV; Versed 1.5 mg IV Moderate Sedation Time:  10 minutes The patient was continuously  monitored during the procedure by the interventional radiology nurse under my direct supervision. COMPLICATIONS: None immediate. PROCEDURE: Informed written consent was obtained from the patient after a thorough discussion of the procedural risks, benefits and alternatives. All questions were addressed. Maximal Sterile Barrier Technique was utilized including caps, mask, sterile gowns, sterile gloves, sterile drape, hand hygiene and skin antiseptic. A timeout was performed prior to the initiation of the procedure. 1% lidocaine was utilized for local anesthesia. Under CT guidance, an 18 gauge needle was directed towards the superior aspect of the right L3-4 facet joint. Aspiration yielded 2 drops of serosanguineous fluid which was sent for culture. FINDINGS: Images document needle placement within the region of the right L3-4 facet joint and posterior soft tissues. IMPRESSION: Successful CT-guided aspiration as described yielding 2 drops serosanguineous fluid. Electronically Signed   By: Jolaine Click M.D.   On: 05/24/2018 15:42      Scheduled Meds: . atorvastatin  20 mg Oral q1800  . baclofen  5 mg Oral BID  . docusate sodium  100 mg Oral BID  . FLUoxetine  60 mg Oral q morning - 10a  . gabapentin  600 mg Oral TID  . Melatonin  9 mg Oral QHS  . nicotine  14 mg Transdermal Daily  . QUEtiapine  300 mg Oral QHS   Continuous Infusions:   LOS: 2 days    Time spent:   Noralee Stain, DO [facility] [email or web address] 05/25/2018, 10:14 AM

## 2018-05-26 NOTE — Progress Notes (Signed)
PROGRESS NOTE    Mary AlarDana L Donaghy  ZOX:[id number] DOB: 1970/10/25 DOA: 05/23/2018 PCP: Macy MisBriscoe, Kim K, MD     Brief Narrative:  Mary Ewing is a 48 y.o. female with medical history significant of h/o substance abuse, HLD, and depression with SI presenting as a direct admission from Dr. Jordan LikesPool.  She woke up on Christmas Eve AM with terrible low back pain.  There was nothing she could do to make it better.  It has gotten progressively worse and her right leg has given out on her a few times, causing her to fall down.  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  She has never required surgery.  Intermittent fevers, as high as 102.8.  Her last fever was maybe a week ago. She has also had 2 abscessed teeth, including one a week or two before Christmas; she has not had them pulled but did get a couple of rounds of antibiotics. She presented to neurosurgery office with lumbar osteomyelitis with back pain, not surgical.  Admitted for facet joint aspiration and antibiotic treatment.  New events last 24 hours / Subjective: No new complaints today.  Remains afebrile.  Assessment & Plan:   Principal Problem:   Acute osteomyelitis of lumbar spine (HCC) Active Problems:   TOBACCO ABUSE   Depression with anxiety   Opioid dependence in remission (HCC)   HLD (hyperlipidemia)   Essential hypertension   Marijuana abuse   Poor dentition   Acute lumbar L3-4 fluid collection, concern for osteomyelitis -IR consulted for facet joint aspiration -Antibiotic therapy not initiated until culture results available to guide treatment -Blood cultures negative to date -Abscess culture from lumbar spine pending, Gram stain negative  Poor dentition -Patient was unable to follow-up with an outpatient dentist due to financial constraints -No dental coverage available in hospital until 2/18  HLD -Continue Lipitor  History of opioid dependence -UDS positive for THC only   Depression with  anxiety -Patient with significant psych history including SI -Continue Prozac, Seroquel, Trazodone  Tobacco dependence -Encourage smoking cessation -Nicotine patch    DVT prophylaxis: SCD Code Status: Full code Family Communication: No family at bedside Disposition Plan: Awaiting culture results to guide antibiotic therapy   Consultants:   IR   Antimicrobials:  Anti-infectives (From admission, onward)   None       Objective: Vitals:   05/25/18 2330 05/25/18 2330 05/26/18 0327 05/26/18 0830  BP: (!) 102/54 (!) 102/54 (!) 107/59 (!) 110/50  Pulse: 85 85 66 69  Resp: 20 20 20 20   Temp: 98 F (36.7 C) 98 F (36.7 C) 97.6 F (36.4 C) 98.3 F (36.8 C)  TempSrc: Oral Oral Oral Tympanic  SpO2: 96% 96% 96% 100%  Weight:      Height:        Intake/Output Summary (Last 24 hours) at 05/26/2018 0951 Last data filed at 05/26/2018 0932 Gross per 24 hour  Intake 420 ml  Output -  Net 420 ml   Filed Weights   05/23/18 2030  Weight: 81.6 kg    Examination: General exam: Appears calm and comfortable  Respiratory system: Clear to auscultation. Respiratory effort normal. Cardiovascular system: S1 & S2 heard, RRR. No JVD, murmurs, rubs, gallops or clicks. No pedal edema. Gastrointestinal system: Abdomen is nondistended, soft and nontender. No organomegaly or masses felt. Normal bowel sounds heard. Central nervous system: Alert and oriented. No focal neurological deficits. Extremities: Symmetric 5 x 5 power. Skin: No rashes, lesions or  ulcers Psychiatry: Judgement and insight appear normal. Mood & affect appropriate.    Data Reviewed: I have personally reviewed following labs and imaging studies  CBC: Recent Labs  Lab 05/23/18 1545 05/24/18 0832  WBC 8.6 5.7  NEUTROABS 5.8  --   HGB 11.7* 11.6*  HCT 37.3 37.0  MCV 85.4 86.2  PLT 366 362   Basic Metabolic Panel: Recent Labs  Lab 05/23/18 1545 05/24/18 0832  NA 136 138  K 3.9 4.0  CL 97* 103  CO2 26 27   GLUCOSE 100* 97  BUN 8 10  CREATININE 1.10* 0.78  CALCIUM 9.3 9.3   GFR: Estimated Creatinine Clearance: 86 mL/min (by C-G formula based on SCr of 0.78 mg/dL). Liver Function Tests: Recent Labs  Lab 05/23/18 1545  AST 23  ALT 15  ALKPHOS 90  BILITOT 0.4  PROT 7.9  ALBUMIN 3.7   No results for input(s): LIPASE, AMYLASE in the last 168 hours. No results for input(s): AMMONIA in the last 168 hours. Coagulation Profile: Recent Labs  Lab 05/24/18 0832  INR 0.96   Cardiac Enzymes: No results for input(s): CKTOTAL, CKMB, CKMBINDEX, TROPONINI in the last 168 hours. BNP (last 3 results) No results for input(s): PROBNP in the last 8760 hours. HbA1C: No results for input(s): HGBA1C in the last 72 hours. CBG: No results for input(s): GLUCAP in the last 168 hours. Lipid Profile: No results for input(s): CHOL, HDL, LDLCALC, TRIG, CHOLHDL, LDLDIRECT in the last 72 hours. Thyroid Function Tests: No results for input(s): TSH, T4TOTAL, FREET4, T3FREE, THYROIDAB in the last 72 hours. Anemia Panel: No results for input(s): VITAMINB12, FOLATE, FERRITIN, TIBC, IRON, RETICCTPCT in the last 72 hours. Sepsis Labs: No results for input(s): PROCALCITON, LATICACIDVEN in the last 168 hours.  Recent Results (from the past 240 hour(s))  Culture, blood (routine x 2)     Status: None (Preliminary result)   Collection Time: 05/23/18  4:17 PM  Result Value Ref Range Status   Specimen Description BLOOD RIGHT ANTECUBITAL  Final   Special Requests   Final    BOTTLES DRAWN AEROBIC AND ANAEROBIC Blood Culture adequate volume   Culture NO GROWTH 3 DAYS  Final   Report Status PENDING  Incomplete  Culture, blood (routine x 2)     Status: None (Preliminary result)   Collection Time: 05/23/18  4:26 PM  Result Value Ref Range Status   Specimen Description BLOOD LEFT HAND  Final   Special Requests   Final    BOTTLES DRAWN AEROBIC AND ANAEROBIC Blood Culture adequate volume   Culture NO GROWTH 3 DAYS   Final   Report Status PENDING  Incomplete  Aerobic/Anaerobic Culture (surgical/deep wound)     Status: None (Preliminary result)   Collection Time: 05/24/18  3:11 PM  Result Value Ref Range Status   Specimen Description ABSCESS  Final   Special Requests NONE  Final   Gram Stain   Final    NO WBC SEEN NO ORGANISMS SEEN CYTOSPIN SMEAR Performed at [facility], [address], [place], [place] [address]    Culture NO GROWTH < 24 HOURS  Final   Report Status PENDING  Incomplete       Radiology Studies: Ct Aspiration  Result Date: 05/24/2018 INDICATION: Suspected septic right L3-4 facet arthritis EXAM: CT-GUIDED ASPIRATION MEDICATIONS: The patient is currently admitted to the hospital and receiving intravenous antibiotics. The antibiotics were administered within an appropriate time frame prior to the initiation of the procedure. ANESTHESIA/SEDATION: Fentanyl  75 mcg IV; Versed 1.5 mg IV Moderate Sedation Time:  10 minutes The patient was continuously monitored during the procedure by the interventional radiology nurse under my direct supervision. COMPLICATIONS: None immediate. PROCEDURE: Informed written consent was obtained from the patient after a thorough discussion of the procedural risks, benefits and alternatives. All questions were addressed. Maximal Sterile Barrier Technique was utilized including caps, mask, sterile gowns, sterile gloves, sterile drape, hand hygiene and skin antiseptic. A timeout was performed prior to the initiation of the procedure. 1% lidocaine was utilized for local anesthesia. Under CT guidance, an 18 gauge needle was directed towards the superior aspect of the right L3-4 facet joint. Aspiration yielded 2 drops of serosanguineous fluid which was sent for culture. FINDINGS: Images document needle placement within the region of the right L3-4 facet joint and posterior soft tissues. IMPRESSION: Successful CT-guided aspiration as described yielding 2 drops  serosanguineous fluid. Electronically Signed   By: Jolaine ClickArthur  Hoss M.D.   On: 05/24/2018 15:42      Scheduled Meds: . atorvastatin  20 mg Oral q1800  . baclofen  5 mg Oral BID  . docusate sodium  100 mg Oral BID  . FLUoxetine  60 mg Oral q morning - 10a  . gabapentin  600 mg Oral TID  . Melatonin  9 mg Oral QHS  . nicotine  14 mg Transdermal Daily  . QUEtiapine  300 mg Oral QHS   Continuous Infusions:   LOS: 3 days    Time spent: 20minutes   Noralee StainJennifer Tekisha Darcey, DO [facility] [email or web address] 05/26/2018, 9:51 AM

## 2018-05-27 ENCOUNTER — Inpatient Hospital Stay (HOSPITAL_COMMUNITY): Payer: Medicare Other

## 2018-05-27 DIAGNOSIS — F1121 Opioid dependence, in remission: Secondary | ICD-10-CM

## 2018-05-27 DIAGNOSIS — R011 Cardiac murmur, unspecified: Secondary | ICD-10-CM

## 2018-05-27 DIAGNOSIS — Z882 Allergy status to sulfonamides status: Secondary | ICD-10-CM

## 2018-05-27 DIAGNOSIS — K0889 Other specified disorders of teeth and supporting structures: Secondary | ICD-10-CM

## 2018-05-27 DIAGNOSIS — F17210 Nicotine dependence, cigarettes, uncomplicated: Secondary | ICD-10-CM

## 2018-05-27 DIAGNOSIS — F329 Major depressive disorder, single episode, unspecified: Secondary | ICD-10-CM

## 2018-05-27 DIAGNOSIS — M4646 Discitis, unspecified, lumbar region: Secondary | ICD-10-CM

## 2018-05-27 DIAGNOSIS — G061 Intraspinal abscess and granuloma: Secondary | ICD-10-CM

## 2018-05-27 DIAGNOSIS — M47817 Spondylosis without myelopathy or radiculopathy, lumbosacral region: Secondary | ICD-10-CM

## 2018-05-27 NOTE — Progress Notes (Signed)
PROGRESS NOTE    Mary Ewing  ZOX:[id number] DOB: 21-May-1970 DOA: 05/23/2018 PCP: Macy MisBriscoe, Kim K, MD     Brief Narrative:  Mary Ewing is a 48 y.o. female with medical history significant of h/o substance abuse, HLD, and depression with SI presenting as a direct admission from Dr. Jordan LikesPool.  She woke up on Christmas Eve AM with terrible low back pain.  There was nothing she could do to make it better.  It has gotten progressively worse and her right leg has given out on her a few times, causing her to fall down.  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  She has never required surgery.  Intermittent fevers, as high as 102.8.  Her last fever was maybe a week ago. She has also had 2 abscessed teeth, including one a week or two before Christmas; she has not had them pulled but did get a couple of rounds of antibiotics. She presented to neurosurgery office with lumbar osteomyelitis with back pain, not surgical.  Admitted for facet joint aspiration and antibiotic treatment.  New events last 24 hours / Subjective: Afebrile, No new complaints, continues to have low back pain with radiation down to her knees   Assessment & Plan:   Principal Problem:   Facet joint disease of lumbosacral region Active Problems:   TOBACCO ABUSE   Depression with anxiety   Opioid dependence in remission (HCC)   HLD (hyperlipidemia)   Essential hypertension   Marijuana abuse   Poor dentition   Acute lumbar L3-4 fluid collection, concern for osteomyelitis -IR consulted for facet joint aspiration -Antibiotic therapy not initiated until culture results available to guide treatment -Blood cultures negative to date -Abscess culture from lumbar spine negative at 3 days -With negative cultures, no fever, and continued symptoms will opt to repeat MRI for re-evaluation   Poor dentition -Patient was unable to follow-up with an outpatient dentist due to financial constraints -No dental coverage available in  hospital until 2/18  HLD -Continue Lipitor  History of opioid dependence -UDS positive for THC only   Depression with anxiety -Patient with significant psych history including SI -Continue Prozac, Seroquel, Trazodone  Tobacco dependence -Encourage smoking cessation -Nicotine patch    DVT prophylaxis: SCD Code Status: Full code Family Communication: No family at bedside Disposition Plan: Awaiting repeat MRI    Consultants:   IR   Antimicrobials:  Anti-infectives (From admission, onward)   None       Objective: Vitals:   05/26/18 2022 05/27/18 0013 05/27/18 0014 05/27/18 0420  BP: 138/88 122/66 122/66 (!) 103/55  Pulse: 81 91 91 70  Resp: 16 17 17 18   Temp: 99.3 F (37.4 C) 98 F (36.7 C) 98 F (36.7 C) 97.6 F (36.4 C)  TempSrc: Oral Oral Oral Oral  SpO2: 97% 98% 99% 100%  Weight:      Height:        Intake/Output Summary (Last 24 hours) at 05/27/2018 1117 Last data filed at 05/26/2018 2200 Gross per 24 hour  Intake 240 ml  Output -  Net 240 ml   Filed Weights   05/23/18 2030  Weight: 81.6 kg    Examination: General exam: Appears calm and comfortable  Respiratory system: Clear to auscultation. Respiratory effort normal. Cardiovascular system: S1 & S2 heard, RRR. No JVD, murmurs, rubs, gallops or clicks. No pedal edema. Gastrointestinal system: Abdomen is nondistended, soft and nontender. No organomegaly or masses felt. Normal bowel sounds heard. Central  nervous system: Alert and oriented. No focal neurological deficits. Extremities: Symmetric 5 x 5 power. R slightly weaker than L Skin: No rashes, lesions or ulcers Psychiatry: Judgement and insight appear normal. Mood & affect appropriate.    Data Reviewed: I have personally reviewed following labs and imaging studies  CBC: Recent Labs  Lab 05/23/18 1545 05/24/18 0832  WBC 8.6 5.7  NEUTROABS 5.8  --   HGB 11.7* 11.6*  HCT 37.3 37.0  MCV 85.4 86.2  PLT 366 362   Basic Metabolic  Panel: Recent Labs  Lab 05/23/18 1545 05/24/18 0832  NA 136 138  K 3.9 4.0  CL 97* 103  CO2 26 27  GLUCOSE 100* 97  BUN 8 10  CREATININE 1.10* 0.78  CALCIUM 9.3 9.3   GFR: Estimated Creatinine Clearance: 86 mL/min (by C-G formula based on SCr of 0.78 mg/dL). Liver Function Tests: Recent Labs  Lab 05/23/18 1545  AST 23  ALT 15  ALKPHOS 90  BILITOT 0.4  PROT 7.9  ALBUMIN 3.7   No results for input(s): LIPASE, AMYLASE in the last 168 hours. No results for input(s): AMMONIA in the last 168 hours. Coagulation Profile: Recent Labs  Lab 05/24/18 0832  INR 0.96   Cardiac Enzymes: No results for input(s): CKTOTAL, CKMB, CKMBINDEX, TROPONINI in the last 168 hours. BNP (last 3 results) No results for input(s): PROBNP in the last 8760 hours. HbA1C: No results for input(s): HGBA1C in the last 72 hours. CBG: No results for input(s): GLUCAP in the last 168 hours. Lipid Profile: No results for input(s): CHOL, HDL, LDLCALC, TRIG, CHOLHDL, LDLDIRECT in the last 72 hours. Thyroid Function Tests: No results for input(s): TSH, T4TOTAL, FREET4, T3FREE, THYROIDAB in the last 72 hours. Anemia Panel: No results for input(s): VITAMINB12, FOLATE, FERRITIN, TIBC, IRON, RETICCTPCT in the last 72 hours. Sepsis Labs: No results for input(s): PROCALCITON, LATICACIDVEN in the last 168 hours.  Recent Results (from the past 240 hour(s))  Culture, blood (routine x 2)     Status: None (Preliminary result)   Collection Time: 05/23/18  4:17 PM  Result Value Ref Range Status   Specimen Description BLOOD RIGHT ANTECUBITAL  Final   Special Requests   Final    BOTTLES DRAWN AEROBIC AND ANAEROBIC Blood Culture adequate volume   Culture   Final    NO GROWTH 4 DAYS Performed at [facility], [address] [place], [place] [address]    Report Status PENDING  Incomplete  Culture, blood (routine x 2)     Status: None (Preliminary result)   Collection Time: 05/23/18  4:26 PM  Result Value Ref  Range Status   Specimen Description BLOOD LEFT HAND  Final   Special Requests   Final    BOTTLES DRAWN AEROBIC AND ANAEROBIC Blood Culture adequate volume   Culture   Final    NO GROWTH 4 DAYS Performed at [facility], [address], [place], [place] [address]    Report Status PENDING  Incomplete  Aerobic/Anaerobic Culture (surgical/deep wound)     Status: None (Preliminary result)   Collection Time: 05/24/18  3:11 PM  Result Value Ref Range Status   Specimen Description ABSCESS  Final   Special Requests NONE  Final   Gram Stain NO WBC SEEN NO ORGANISMS SEEN CYTOSPIN SMEAR   Final   Culture   Final    NO GROWTH 3 DAYS NO ANAEROBES ISOLATED; CULTURE IN PROGRESS FOR 5 DAYS Performed at [facility],  [address], [place], [place] [address]    Report Status PENDING  Incomplete       Radiology Studies: No results found.    Scheduled Meds: . atorvastatin  20 mg Oral q1800  . baclofen  5 mg Oral BID  . docusate sodium  100 mg Oral BID  . FLUoxetine  60 mg Oral q morning - 10a  . gabapentin  600 mg Oral TID  . Melatonin  9 mg Oral QHS  . nicotine  14 mg Transdermal Daily  . QUEtiapine  300 mg Oral QHS   Continuous Infusions:   LOS: 4 days    Time spent:   Noralee Stain, DO [facility] [email or web address] 05/27/2018, 11:17 AM

## 2018-05-27 NOTE — Progress Notes (Signed)
Patient off floor for procedure 

## 2018-05-27 NOTE — Consult Note (Signed)
[facility]    Date of Admission:  05/23/2018     Total days of antibiotics 0               Reason for Consult: Paraspinal Abscess/fluid collection Referring Provider: Maylene Roes Primary Care Provider: Katherina Mires, MD   Assessment/Plan:  Ms. Snodgrass is a 48 year old female with previous history of opioid use disorder/heroin use (clean for 1 year) admitted with worsening back pain and found to have a spinal abscess. IR aspiration with no organisms on gram stain and no growth on cultures. Concern source of the infection may from her poor dentition. Will check orthopantogram to rule out active abscess. She will likely need to see an oral Psychologist, sport and exercise. Have spoken with IR who feels a second attempt at aspiration would not be successful and unlikely to provide additional information as these cultures tend to be negative. Will need to treat for oral flora and anaerobes with choice of antibiotics. Given her heart murmur and current abscess will check TTE to rule out endocarditis although blood cultures are currently without growth to date. Ms. Kunath is likely to need at least 6 weeks of antibiotics and at this time would appear to be okay going home with a PICC line.   1. Obtain orthopantogram. 2. Obtain TTE. 3. Consider starting Unasyn or ceftriaxone/metronidazole. 4. Will check Hepatitis C antibody for routine health maintenance.    Principal Problem:   Facet joint disease of lumbosacral region Active Problems:   Poor dentition   TOBACCO ABUSE   Depression with anxiety   Opioid dependence in remission (HCC)   HLD (hyperlipidemia)   Essential hypertension   Marijuana abuse   . atorvastatin  20 mg Oral q1800  . baclofen  5 mg Oral BID  . docusate sodium  100 mg Oral BID  . FLUoxetine  60 mg Oral q morning - 10a  . gabapentin  600 mg Oral TID  . Melatonin  9 mg Oral QHS  . nicotine  14 mg Transdermal Daily  . QUEtiapine  300 mg Oral QHS     HPI: KABELLA CASSIDY is a 48 y.o. female with a previous medical history of opioid/heroin use, depression and degenerative joint disease who was directly admitted by Dr. Annette Stable with spinal abscess.  Ms. Dhaliwal began having pain around the time of Christmas located in her lumbar spine that progressively worsened to a severity that her right leg gave out on her resulting in a fall. Prior to the start of her back pain she was on antibiotics for a dental infection with amoxicillin for about 1 month. She was experiencing intermittent fevers throughout with a max temperature of 102.8. She had been seen by Sports Medicine and by Neurosurgery. Initial MRI on 1/26 with right facet and perifacet edema at the L3-L4 level with a small fluid collection measuring 3.1 x 0.7 x 0.7 cm extending posteriorly and superiorly from the face joint concerning for abscess or synovial cyst.   IR performed CT aspiration of the area on 1/31 obtaining 2 drops of bloody fluid. No organisms were observed on gram stain and cultures have been without growth in 3 days. She has been afebrile since admission with blood cultures without growth to date. Initial ESR was 64 with CRP of 2.1. Repeat imaging completed today shows no change in lesions size or status.   She has not been to see the dentist to have the teeth extracted due to  cost. 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  Ms. Nicolasa Ducking has been sober for just over a year now and weaned off Suboxone for opioid use disorder. She has no cravings and is currently not attending any counseling or treatment. Denies signs/symptoms of withdrawal at present.    Review of Systems: Review of Systems  Constitutional: Positive for fever. Negative for chills and malaise/fatigue.  Respiratory: Negative for cough, shortness of breath and wheezing.   Cardiovascular: Negative for chest pain and leg swelling.  Gastrointestinal: Negative for abdominal pain, constipation, diarrhea, nausea and  vomiting.  Musculoskeletal: Positive for back pain.  Skin: Negative for rash.  Neurological: Negative for dizziness, weakness and headaches.  Psychiatric/Behavioral: Negative for depression.     Past Medical History:  Diagnosis Date  . Acute renal failure ([place])   . Anxiety   . Asthmatic bronchitis    history of  . Depression   . DJD (degenerative joint disease)   . GERD (gastroesophageal reflux disease)   . H/O: suicide attempt    x 3  . High cholesterol   . Iron deficiency anemia   . Substance abuse ([place])     Social History   Tobacco Use  . Smoking status: Current Every Day Smoker    Packs/day: 1.00    Years: 24.00    Pack years: 24.00    Types: Cigarettes  . Smokeless tobacco: Never Used  . Tobacco comment: needs a patch  Substance Use Topics  . Alcohol use: Yes    Alcohol/week: 0.0 standard drinks    Comment: occ  . Drug use: Yes    Types: Cocaine, Marijuana    Comment: prescription drugs;last use of marijuana was this AM, denies cocaine     Family History  Problem Relation Age of Onset  . Asthma Mother   . COPD Mother   . Anxiety disorder Mother   . Diabetes Mother   . Depression Mother   . Hypertension Mother     Allergies  Allergen Reactions  . Sulfonamide Derivatives Hives    OBJECTIVE: Blood pressure (!) 145/68, pulse 76, temperature 99 F (37.2 C), temperature source Oral, resp. rate 18, height '5\' 2"'  (1.575 m), weight 81.6 kg, last menstrual period 05/09/2018, SpO2 100 %.  Physical Exam Constitutional:      General: She is not in acute distress.    Appearance: She is well-developed.     Comments: Seated on the side of the bed; pleasant.   Cardiovascular:     Rate and Rhythm: Normal rate and regular rhythm.     Heart sounds: Murmur (Per patient not new) present.  Pulmonary:     Effort: Pulmonary effort is normal.     Breath sounds: Normal breath sounds. No wheezing, rhonchi or rales.  Musculoskeletal:     Comments: Low back with no  obvious deformity, discoloration or edema. There is generalized tenderness of the spinous and transverse process as well as paraspinal musculature from L1-L4. Distal pulses and sensation are intact and appropriate.   Skin:    General: Skin is warm and dry.  Neurological:     Mental Status: She is alert and oriented to person, place, and time.  Psychiatric:        Behavior: Behavior normal.        Thought Content: Thought content normal.        Judgment: Judgment normal.     Lab Results Lab Results  Component Value Date   WBC 5.7 05/24/2018  HGB 11.6 (L) 05/24/2018   HCT 37.0 05/24/2018   MCV 86.2 05/24/2018   PLT 362 05/24/2018    Lab Results  Component Value Date   CREATININE 0.78 05/24/2018   BUN 10 05/24/2018   NA 138 05/24/2018   K 4.0 05/24/2018   CL 103 05/24/2018   CO2 27 05/24/2018    Lab Results  Component Value Date   ALT 15 05/23/2018   AST 23 05/23/2018   ALKPHOS 90 05/23/2018   BILITOT 0.4 05/23/2018     Microbiology: Recent Results (from the past 240 hour(s))  Culture, blood (routine x 2)     Status: None (Preliminary result)   Collection Time: 05/23/18  4:17 PM  Result Value Ref Range Status   Specimen Description BLOOD RIGHT ANTECUBITAL  Final   Special Requests   Final    BOTTLES DRAWN AEROBIC AND ANAEROBIC Blood Culture adequate volume   Culture   Final    NO GROWTH 4 DAYS Performed at [facility], [address], [place], [place] [address]    Report Status PENDING  Incomplete  Culture, blood (routine x 2)     Status: None (Preliminary result)   Collection Time: 05/23/18  4:26 PM  Result Value Ref Range Status   Specimen Description BLOOD LEFT HAND  Final   Special Requests   Final    BOTTLES DRAWN AEROBIC AND ANAEROBIC Blood Culture adequate volume   Culture   Final    NO GROWTH 4 DAYS Performed at [facility], [place] [address], [place], [place] [address]    Report Status PENDING  Incomplete  Aerobic/Anaerobic Culture  (surgical/deep wound)     Status: None (Preliminary result)   Collection Time: 05/24/18  3:11 PM  Result Value Ref Range Status   Specimen Description ABSCESS  Final   Special Requests NONE  Final   Gram Stain NO WBC SEEN NO ORGANISMS SEEN CYTOSPIN SMEAR   Final   Culture   Final    NO GROWTH 3 DAYS NO ANAEROBES ISOLATED; CULTURE IN PROGRESS FOR 5 DAYS Performed at [facility], [place] [address] [place], [place] [address]    Report Status PENDING  Incomplete     Terri Piedra, [place] [facility] Pager  05/27/2018  4:31 PM

## 2018-05-27 NOTE — Progress Notes (Signed)
Occupational Therapy Treatment Patient Details Name: Mary Ewing MRN: [id number] DOB: 1970-09-30 Today's Date: 05/27/2018    History of present illness Pt is a 48 y/o female admitted secondary to back pain and found to have acute lumbar osteomyelitis. PMH including but not limited to substance abuse, HLD, and depression with SI.   OT com  Pt recently returned from MRI and received pain medication prior to OT arrival. Pt visually upset secondary due to her son not being about to visit. OT providing therapeutic use of self and encouraging pt to participate in OT intervention.  OT educating and reviewing HEP with pt with use of theraband. Pt returning demonstrations with use of paper handout and min verbal cuing. Pt utilized less resistance or no resistance on L UE as needed. Pt performed 10 reps of alternating punches, shoulder diagonals, shoulder flexion, and bicep curls.  Follow Up Recommendations  No OT follow up    Equipment Recommendations  Tub/shower bench       Precautions / Restrictions Precautions Precautions: Fall Restrictions Weight Bearing Restrictions: No       Mobility Bed Mobility Overal bed mobility: Modified Independent            Balance Overall balance assessment: Needs assistance Sitting-balance support: Feet supported   Sitting balance - Comments: sitting EOB for UB exercise with supervision          ADL either performed or assessed with clinical judgement        Vision Baseline Vision/History: No visual deficits            Cognition Arousal/Alertness: Awake/alert Behavior During Therapy: WFL for tasks assessed/performed Overall Cognitive Status: Within Functional Limits for tasks assessed                   Pertinent Vitals/ Pain       Pain Assessment: 0-10 Pain Score: 7  Pain Location: back Pain Descriptors / Indicators: Guarding;Grimacing;Discomfort Pain Intervention(s): Limited activity within patient's tolerance;Premedicated  before session;Repositioned;Monitored during session         Frequency  Min 1X/week        Progress Toward Goals  OT Goals(current goals can now be found in the care plan section)  Progress towards OT goals: Progressing toward goals  Acute Rehab OT Goals Patient Stated Goal: decrease pain OT Goal Formulation: With patient Time For Goal Achievement: 06/10/18 Potential to Achieve Goals: Good  Plan Discharge plan remains appropriate       AM-PAC OT "6 Clicks" Daily Activity     Outcome Measure   Help from another person eating meals?: None Help from another person taking care of personal grooming?: None Help from another person toileting, which includes using toliet, bedpan, or urinal?: None Help from another person bathing (including washing, rinsing, drying)?: None Help from another person to put on and taking off regular upper body clothing?: None Help from another person to put on and taking off regular lower body clothing?: None 6 Click Score: 24    End of Session Equipment Utilized During Treatment: (none needed)  OT Visit Diagnosis: Other abnormalities of gait and mobility (R26.89);Pain;History of falling (Z91.81) Pain - part of body: (back)   Activity Tolerance Patient limited by pain;Patient tolerated treatment well   Patient Left in bed;with bed alarm set;with call bell/phone within reach           Time: 1450-1509 OT Time Calculation (min): 19 min  Charges: OT General Charges $OT Visit: 1 Visit OT Treatments $Therapeutic Exercise:  8-22 mins   Alen Bleacher, MS, OTR/L 05/27/2018, 3:26 PM

## 2018-05-28 ENCOUNTER — Inpatient Hospital Stay (HOSPITAL_COMMUNITY): Payer: Medicare Other

## 2018-05-28 ENCOUNTER — Inpatient Hospital Stay: Payer: Self-pay

## 2018-05-28 DIAGNOSIS — I10 Essential (primary) hypertension: Secondary | ICD-10-CM

## 2018-05-28 DIAGNOSIS — M4647 Discitis, unspecified, lumbosacral region: Secondary | ICD-10-CM

## 2018-05-28 LAB — CBC
HCT: 36.5 % (ref 36.0–46.0)
Hemoglobin: 11.1 g/dL — ABNORMAL LOW (ref 12.0–15.0)
MCH: 26.4 pg (ref 26.0–34.0)
MCHC: 30.4 g/dL (ref 30.0–36.0)
MCV: 86.9 fL (ref 80.0–100.0)
Platelets: 233 10*3/uL (ref 150–400)
RBC: 4.2 MIL/uL (ref 3.87–5.11)
RDW: 13.7 % (ref 11.5–15.5)
WBC: 6.6 10*3/uL (ref 4.0–10.5)
nRBC: 0 % (ref 0.0–0.2)

## 2018-05-28 LAB — CULTURE, BLOOD (ROUTINE X 2)
CULTURE: NO GROWTH
Culture: NO GROWTH
SPECIAL REQUESTS: ADEQUATE
Special Requests: ADEQUATE

## 2018-05-28 LAB — BASIC METABOLIC PANEL
Anion gap: 9 (ref 5–15)
BUN: 12 mg/dL (ref 6–20)
CHLORIDE: 107 mmol/L (ref 98–111)
CO2: 23 mmol/L (ref 22–32)
CREATININE: 0.79 mg/dL (ref 0.44–1.00)
Calcium: 8.8 mg/dL — ABNORMAL LOW (ref 8.9–10.3)
GFR calc Af Amer: >60 mL/min (ref >60)
GFR calc non Af Amer: >60 mL/min (ref >60)
Glucose, Bld: 92 mg/dL (ref 70–99)
Potassium: 3.9 mmol/L (ref 3.5–5.1)
SODIUM: 139 mmol/L (ref 135–145)

## 2018-05-28 LAB — QUANTIFERON-TB GOLD PLUS (RQFGPL)
QUANTIFERON TB1 AG VALUE: 0.04 [IU]/mL
QuantiFERON Mitogen Value: >10 IU/mL
QuantiFERON Nil Value: 0.05 IU/mL
QuantiFERON TB2 Ag Value: 0.04 IU/mL

## 2018-05-28 LAB — QUANTIFERON-TB GOLD PLUS: QUANTIFERON-TB GOLD PLUS: NEGATIVE

## 2018-05-28 MED ORDER — GUAIFENESIN-CODEINE 100-10 MG/5ML PO SOLN
5.0000 mL | ORAL | Status: DC | PRN
  Administered 2018-05-28: 5 mL via ORAL
  Filled 2018-05-28 (×2): qty 5

## 2018-05-28 MED ORDER — METRONIDAZOLE 500 MG PO TABS
500.0000 mg | ORAL_TABLET | Freq: Three times a day (TID) | ORAL | Status: DC
  Administered 2018-05-28 – 2018-05-29 (×4): 500 mg via ORAL
  Filled 2018-05-28 (×4): qty 1

## 2018-05-28 MED ORDER — FLUCONAZOLE 150 MG PO TABS
150.0000 mg | ORAL_TABLET | Freq: Once | ORAL | Status: AC
  Administered 2018-05-28: 150 mg via ORAL
  Filled 2018-05-28: qty 1

## 2018-05-28 MED ORDER — SODIUM CHLORIDE 0.9 % IV SOLN
2.0000 g | INTRAVENOUS | Status: DC
  Administered 2018-05-28 – 2018-05-29 (×2): 2 g via INTRAVENOUS
  Filled 2018-05-28 (×2): qty 20

## 2018-05-28 NOTE — Progress Notes (Signed)
PT Cancellation Note  Patient Details Name: Mary Ewing MRN: [id number] DOB: 1970/11/08   Cancelled Treatment:    Reason Eval/Treat Not Completed: Patient declined, no reason specified. Pt politely declining therapy services x 2 attempts. On first attempt, pt requesting pain medication (RN notified) and on 2nd attempt pt resting.   Laurina Bustle, PT, DPT Acute Rehabilitation Services Pager [phone] Office [phone]    Vanetta Mulders 05/28/2018, 3:57 PM

## 2018-05-28 NOTE — Progress Notes (Signed)
PHARMACY CONSULT NOTE FOR:  OUTPATIENT  PARENTERAL ANTIBIOTIC THERAPY (OPAT)  Indication: Culture-negative discitis Regimen: Ceftriaxone 2 gm IV every 24 hours + Metronidazole 500 mg PO every 8 hours End date: 07/08/2018  IV antibiotic discharge orders are pended. To discharging provider:  please sign these orders via discharge navigator,  Select New Orders & click on the button choice - Manage This Unsigned Work.     Thank you for allowing pharmacy to be a part of this patient's care.  Sharin Mons, PharmD, BCPS, BCIDP Infectious Diseases Clinical Pharmacist Phone: [phone] 05/28/2018, 11:09 AM

## 2018-05-28 NOTE — Progress Notes (Signed)
Subjective: No new complaints   Antibiotics:  Anti-infectives (From admission, onward)   Start     Dose/Rate Route Frequency Ordered Stop   05/28/18 1400  metroNIDAZOLE (FLAGYL) tablet 500 mg     500 mg Oral Every 8 hours 05/28/18 0901     05/28/18 1000  cefTRIAXone (ROCEPHIN) 2 g in sodium chloride 0.9 % 100 mL IVPB     2 g 200 mL/hr over 30 Minutes Intravenous Every 24 hours 05/28/18 0900        Medications: Scheduled Meds: . atorvastatin  20 mg Oral q1800  . baclofen  5 mg Oral BID  . docusate sodium  100 mg Oral BID  . FLUoxetine  60 mg Oral q morning - 10a  . gabapentin  600 mg Oral TID  . Melatonin  9 mg Oral QHS  . metroNIDAZOLE  500 mg Oral Q8H  . nicotine  14 mg Transdermal Daily  . QUEtiapine  300 mg Oral QHS   Continuous Infusions: . cefTRIAXone (ROCEPHIN)  IV 2 g (05/28/18 1048)   PRN Meds:.acetaminophen **OR** acetaminophen, guaiFENesin-codeine, ketorolac, ondansetron **OR** ondansetron (ZOFRAN) IV, oxyCODONE, traMADol, traZODone    Objective: Weight change:   Intake/Output Summary (Last 24 hours) at 05/28/2018 1102 Last data filed at 05/28/2018 0600 Gross per 24 hour  Intake 840 ml  Output -  Net 840 ml   Blood pressure 136/73, pulse 72, temperature 97.7 F (36.5 C), temperature source Oral, resp. rate 18, height '5\' 2"'  (1.575 m), weight 81.6 kg, last menstrual period 05/09/2018, SpO2 100 %. Temp:  [97.7 F (36.5 C)-100.3 F (37.9 C)] 97.7 F (36.5 C) (02/04 0833) Pulse Rate:  [72-109] 72 (02/04 0833) Resp:  [18-20] 18 (02/04 0833) BP: (109-175)/(68-92) 136/73 (02/04 0833) SpO2:  [94 %-100 %] 100 % (02/04 9357)  Physical Exam: General: Alert and awake, oriented x3, not in any acute distress. HEENT: anicteric sclera, EOMI CVS regular rate, normal  Chest: , no wheezing, no respiratory distress Abdomen: soft non-distended,  Extremities: no edema or deformity noted bilaterally Skin: no rashes Neuro: nonfocal  CBC:    BMET Recent  Labs    05/28/18 0550  NA 139  K 3.9  CL 107  CO2 23  GLUCOSE 92  BUN 12  CREATININE 0.79  CALCIUM 8.8*     Liver Panel  No results for input(s): PROT, ALBUMIN, AST, ALT, ALKPHOS, BILITOT, BILIDIR, IBILI in the last 72 hours.     Sedimentation Rate No results for input(s): ESRSEDRATE in the last 72 hours. C-Reactive Protein No results for input(s): CRP in the last 72 hours.  Micro Results: Recent Results (from the past 720 hour(s))  Culture, blood (routine x 2)     Status: None   Collection Time: 05/23/18  4:17 PM  Result Value Ref Range Status   Specimen Description BLOOD RIGHT ANTECUBITAL  Final   Special Requests   Final    BOTTLES DRAWN AEROBIC AND ANAEROBIC Blood Culture adequate volume   Culture   Final    NO GROWTH 5 DAYS Performed at [facility], [address], [place], [place] [address]    Report Status 05/28/2018 FINAL  Final  Culture, blood (routine x 2)     Status: None   Collection Time: 05/23/18  4:26 PM  Result Value Ref Range Status   Specimen Description BLOOD LEFT HAND  Final   Special Requests   Final    BOTTLES DRAWN AEROBIC AND ANAEROBIC Blood  Culture adequate volume   Culture   Final    NO GROWTH 5 DAYS Performed at [facility], [address] [place], [place] [address]    Report Status 05/28/2018 FINAL  Final  Aerobic/Anaerobic Culture (surgical/deep wound)     Status: None (Preliminary result)   Collection Time: 05/24/18  3:11 PM  Result Value Ref Range Status   Specimen Description ABSCESS  Final   Special Requests NONE  Final   Gram Stain NO WBC SEEN NO ORGANISMS SEEN CYTOSPIN SMEAR   Final   Culture   Final    NO GROWTH 3 DAYS NO ANAEROBES ISOLATED; CULTURE IN PROGRESS FOR 5 DAYS Performed at [facility], [place] [address] [place], [place] [address]    Report Status PENDING  Incomplete    Studies/Results: Dg Orthopantogram  Result Date: 05/27/2018 CLINICAL DATA:  Patient complains of 2 abscessed teeth  and TMJ issues x couple years now. Patient states she only has about 10 teeth left and that some of them are cracked. Patient states they are trying to rule out infection from spine. Current smoker EXAM: ORTHOPANTOGRAM/PANORAMIC COMPARISON:  Cervical spine radiographs 04/22/2015 FINDINGS: Multiple prior tooth extractions or resorptions. The remaining teeth demonstrate multiple dental caries. Periapical lucencies have multiple teeth suggesting periodontal disease. No acute fracture or dislocation of the mandible. IMPRESSION: Multiple dental caries and periapical lucencies consistent with periodontal disease. Multiple prior tooth extractions or resorption. Electronically Signed   By: Lucienne Capers M.D.   On: 05/27/2018 22:22   Mr Lumbar Spine Wo Contrast  Result Date: 05/27/2018 CLINICAL DATA:  Possible right L3-L4 septic arthritis status post aspiration. Persistent back pain with bilateral leg numbness and weakness. EXAM: MRI LUMBAR SPINE WITHOUT CONTRAST TECHNIQUE: Multiplanar, multisequence MR imaging of the lumbar spine was performed. No intravenous contrast was administered. COMPARISON:  MRI lumbar spine dated May 19, 2018. FINDINGS: Segmentation:  Unchanged sacralization of L5. Alignment:  Physiologic. Vertebrae: No fracture, evidence of discitis, or bone lesion. Unchanged right facet and perifacet edema at L3-L4 extending into both right L3 and L4 pedicles. Unchanged small facet joint effusion. Unchanged 1.1 x 0.7 x 3.1 cm fluid collection extending posteriorly and superiorly from facet joint. Unchanged surrounding soft tissue edema. Conus medullaris and cauda equina: Conus extends to the T12-L1 level. Conus and cauda equina appear normal. Paraspinal and other soft tissues: Cholelithiasis. Disc levels: T11-T12: Negative. T12-L1:  Negative. L1-L2:  Negative. L2-L3:  Negative. L3-L4: Unchanged shallow disc bulge small superimposed left subarticular and foraminal disc protrusion. Unchanged abnormal low  T1 signal in the epidural fat along the posterior and right lateral thecal sac. 5 x 5 x 10 mm fluid collection in the posterior epidural space is unchanged. Unchanged mild right lateral recess and neuroforaminal stenosis. No spinal canal or left neuroforaminal stenosis. L4-L5:  Negative. L5-S1:  Negative. IMPRESSION: 1. Stable inflammatory changes surrounding the right L3-L4 facet joint with infiltration/effacement of the epidural fat along the right and posterior aspects of the thecal sac. Unchanged 3.1 cm fluid collection in the posterior right paraspinous soft tissues and 1.0 cm fluid collection in the posterior epidural space. These could represent synovial cysts or abscesses. 2. Unchanged mild right lateral recess and neuroforaminal stenosis at L3-L4. 3. Cholelithiasis. Electronically Signed   By: Titus Dubin M.D.   On: 05/27/2018 13:43      Assessment/Plan:  INTERVAL HISTORY: IR did not find repeat aspiration to be likely useful so antibiotics were started   Principal Problem:  Facet joint disease of lumbosacral region Active Problems:   TOBACCO ABUSE   Depression with anxiety   Opioid dependence in remission (HCC)   HLD (hyperlipidemia)   Essential hypertension   Marijuana abuse   Poor dentition    AROHI SALVATIERRA is a 48 y.o. female with  " culture negative" discitis likely due to odontogenic infection  #1 Discitis: We will plan on giving her 6-week course of ceftriaxone 2 g daily with metronidazole 4 500 mg orally 3 times daily.  Diagnosis: Discitis  Culture Result: No growth  Allergies  Allergen Reactions  . Sulfonamide Derivatives Hives    OPAT Orders Discharge antibiotics: Ceftriaxone 2 g IV daily metronidazole 500 mg orally 3 times daily   Duration: 6 weeks End Date: July 08, 2018  [facility] Care Per Protocol:  Labs weekly while on IV antibiotics: _x_ CBC with differential _x_ BMP  _x_ CRP _x_ ESR  _x_ Please pull PIC at completion of IV  antibiotics __ Please leave PIC in place until doctor has seen patient or been notified  Fax weekly labs to [phone]  Clinic Follow Up Appt:  June 25, 2018 at 2:45 PM at [facility] on [address], [address] with Dr. Tommy Medal   LOS: 5 days   Alcide Evener 05/28/2018, 11:02 AM

## 2018-05-28 NOTE — Consult Note (Signed)
            [facility]  05/28/2018  Mary Ewing 11-03-1970 [id number]   Attempt to seepatientat the bedsideto identify possible discharge needs but chart review shows that primary care provideris Dr. Delbert HarnessKim Briscoe [facility].  Patient is not currently a beneficiary of the [facility]. Membership roster (APL) was use to verify non-eligible status.   For additional questions please contact:  Karin GoldenLorraine A. Natahlia Hoggard, BSN, RN-BC [facility] Navigator Cell: [phone]

## 2018-05-28 NOTE — Care Management Important Message (Signed)
Important Message  Patient Details  Name: Mary Ewing MRN: [id number] Date of Birth: May 28, 1970   Medicare Important Message Given:  Yes    Alayasia Breeding 05/28/2018, 8:19 AM

## 2018-05-28 NOTE — Progress Notes (Signed)
  Echocardiogram 2D Echocardiogram has been performed.  Mary Ewing 05/28/2018, 2:47 PM

## 2018-05-28 NOTE — Progress Notes (Addendum)
PROGRESS NOTE    Mary Ewing  ZOX:[id number] DOB: 01-11-1971 DOA: 05/23/2018 PCP: Macy MisBriscoe, Kim K, MD     Brief Narrative:  Mary Ewing is a 48 y.o. female with medical history significant of h/o substance abuse, HLD, and depression with SI presenting as a direct admission from Dr. Jordan LikesPool.  She woke up on Christmas Eve AM with terrible low back pain.  There was nothing she could do to make it better.  It has gotten progressively worse and her right leg has given out on her a few times, causing her to fall down.  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  She has never required surgery.  Intermittent fevers, as high as 102.8.  Her last fever was maybe a week ago. She has also had 2 abscessed teeth, including one a week or two before Christmas; she has not had them pulled but did get a couple of rounds of antibiotics. She presented to neurosurgery office with lumbar osteomyelitis with back pain, not surgical.  Admitted for facet joint aspiration and antibiotic treatment.  New events last 24 hours / Subjective: Did not sleep well last night.  Remains afebrile.  Assessment & Plan:   Principal Problem:   Facet joint disease of lumbosacral region Active Problems:   TOBACCO ABUSE   Depression with anxiety   Opioid dependence in remission (HCC)   HLD (hyperlipidemia)   Essential hypertension   Marijuana abuse   Poor dentition   Acute lumbar L3-4 fluid collection, concern for septic facet joint -IR consulted for facet joint aspiration, cultures negative to date  -Blood cultures negative  -Infectious disease consulted -Echocardiogram pending -Started on rocephin, flagyl   Dental caries  -Patient was unable to follow-up with an outpatient dentist due to financial constraints -[employer] completed multiple dental caries and periapical lucencies consistent with periodontal disease -No dental/oral surgery coverage available in hospital until 2/18 -Asked case manager to provide  outpatient resources for patient to follow with dentist/oral surgery.  Patient needs dental extraction  HLD -Continue Lipitor  History of opioid dependence -UDS positive for THC only   Depression with anxiety -Patient with significant psych history including SI -Continue Prozac, Seroquel, Trazodone  Tobacco dependence -Encourage smoking cessation -Nicotine patch    DVT prophylaxis: SCD Code Status: Full code Family Communication: No family at bedside Disposition Plan: Echocardiogram pending, further infectious disease recommendations   Consultants:   IR  ID   Antimicrobials:  Anti-infectives (From admission, onward)   Start     Dose/Rate Route Frequency Ordered Stop   05/28/18 1400  metroNIDAZOLE (FLAGYL) tablet 500 mg     500 mg Oral Every 8 hours 05/28/18 0901     05/28/18 1000  cefTRIAXone (ROCEPHIN) 2 g in sodium chloride 0.9 % 100 mL IVPB     2 g 200 mL/hr over 30 Minutes Intravenous Every 24 hours 05/28/18 0900         Objective: Vitals:   05/27/18 2026 05/28/18 0009 05/28/18 0410 05/28/18 0833  BP: (!) 163/92 126/72 109/69 136/73  Pulse: 90 (!) 103 91 72  Resp: 18 18 18 18   Temp: 98.9 F (37.2 C) 98.9 F (37.2 C) 98.7 F (37.1 C) 97.7 F (36.5 C)  TempSrc: Oral Oral Oral Oral  SpO2: 99% 94% 94% 100%  Weight:      Height:        Intake/Output Summary (Last 24 hours) at 05/28/2018 1037 Last data filed at 05/28/2018 0600 Gross per  24 hour  Intake 840 ml  Output -  Net 840 ml   Filed Weights   05/23/18 2030  Weight: 81.6 kg    Examination: General exam: Appears calm and comfortable  Respiratory system: Clear to auscultation. Respiratory effort normal. Cardiovascular system: S1 & S2 heard, RRR. No JVD, murmurs, rubs, gallops or clicks. No pedal edema. Gastrointestinal system: Abdomen is nondistended, soft and nontender. No organomegaly or masses felt. Normal bowel sounds heard. Central nervous system: Alert and oriented. No focal  neurological deficits. Extremities: Symmetric 5 x 5 power. Skin: No rashes, lesions or ulcers Psychiatry: Judgement and insight appear normal. Mood & affect appropriate.    Data Reviewed: I have personally reviewed following labs and imaging studies  CBC: Recent Labs  Lab 05/23/18 1545 05/24/18 0832 05/28/18 0550  WBC 8.6 5.7 6.6  NEUTROABS 5.8  --   --   HGB 11.7* 11.6* 11.1*  HCT 37.3 37.0 36.5  MCV 85.4 86.2 86.9  PLT 366 362 233   Basic Metabolic Panel: Recent Labs  Lab 05/23/18 1545 05/24/18 0832 05/28/18 0550  NA 136 138 139  K 3.9 4.0 3.9  CL 97* 103 107  CO2 26 27 23   GLUCOSE 100* 97 92  BUN 8 10 12   CREATININE 1.10* 0.78 0.79  CALCIUM 9.3 9.3 8.8*   GFR: Estimated Creatinine Clearance: 86 mL/min (by C-G formula based on SCr of 0.79 mg/dL). Liver Function Tests: Recent Labs  Lab 05/23/18 1545  AST 23  ALT 15  ALKPHOS 90  BILITOT 0.4  PROT 7.9  ALBUMIN 3.7   No results for input(s): LIPASE, AMYLASE in the last 168 hours. No results for input(s): AMMONIA in the last 168 hours. Coagulation Profile: Recent Labs  Lab 05/24/18 0832  INR 0.96   Cardiac Enzymes: No results for input(s): CKTOTAL, CKMB, CKMBINDEX, TROPONINI in the last 168 hours. BNP (last 3 results) No results for input(s): PROBNP in the last 8760 hours. HbA1C: No results for input(s): HGBA1C in the last 72 hours. CBG: No results for input(s): GLUCAP in the last 168 hours. Lipid Profile: No results for input(s): CHOL, HDL, LDLCALC, TRIG, CHOLHDL, LDLDIRECT in the last 72 hours. Thyroid Function Tests: No results for input(s): TSH, T4TOTAL, FREET4, T3FREE, THYROIDAB in the last 72 hours. Anemia Panel: No results for input(s): VITAMINB12, FOLATE, FERRITIN, TIBC, IRON, RETICCTPCT in the last 72 hours. Sepsis Labs: No results for input(s): PROCALCITON, LATICACIDVEN in the last 168 hours.  Recent Results (from the past 240 hour(s))  Culture, blood (routine x 2)     Status: None    Collection Time: 05/23/18  4:17 PM  Result Value Ref Range Status   Specimen Description BLOOD RIGHT ANTECUBITAL  Final   Special Requests   Final    BOTTLES DRAWN AEROBIC AND ANAEROBIC Blood Culture adequate volume   Culture   Final    NO GROWTH 5 DAYS Performed at [facility], [address], [place], [place] [address]    Report Status 05/28/2018 FINAL  Final  Culture, blood (routine x 2)     Status: None   Collection Time: 05/23/18  4:26 PM  Result Value Ref Range Status   Specimen Description BLOOD LEFT HAND  Final   Special Requests   Final    BOTTLES DRAWN AEROBIC AND ANAEROBIC Blood Culture adequate volume   Culture   Final    NO GROWTH 5 DAYS Performed at [facility], [address], [place], [place] [address]  Report Status 05/28/2018 FINAL  Final  Aerobic/Anaerobic Culture (surgical/deep wound)     Status: None (Preliminary result)   Collection Time: 05/24/18  3:11 PM  Result Value Ref Range Status   Specimen Description ABSCESS  Final   Special Requests NONE  Final   Gram Stain NO WBC SEEN NO ORGANISMS SEEN CYTOSPIN SMEAR   Final   Culture   Final    NO GROWTH 3 DAYS NO ANAEROBES ISOLATED; CULTURE IN PROGRESS FOR 5 DAYS Performed at [facility], [address] [place], [place] [id number]    Report Status PENDING  Incomplete       Radiology Studies: Dg Orthopantogram  Result Date: 05/27/2018 CLINICAL DATA:  Patient complains of 2 abscessed teeth and TMJ issues x couple years now. Patient states she only has about 10 teeth left and that some of them are cracked. Patient states they are trying to rule out infection from spine. Current smoker EXAM: ORTHOPANTOGRAM/PANORAMIC COMPARISON:  Cervical spine radiographs 04/22/2015 FINDINGS: Multiple prior tooth extractions or resorptions. The remaining teeth demonstrate multiple dental caries. Periapical lucencies have multiple teeth suggesting periodontal disease. No acute fracture or dislocation of the  mandible. IMPRESSION: Multiple dental caries and periapical lucencies consistent with periodontal disease. Multiple prior tooth extractions or resorption. Electronically Signed   By: Burman NievesWilliam  Stevens M.D.   On: 05/27/2018 22:22   Mr Lumbar Spine Wo Contrast  Result Date: 05/27/2018 CLINICAL DATA:  Possible right L3-L4 septic arthritis status post aspiration. Persistent back pain with bilateral leg numbness and weakness. EXAM: MRI LUMBAR SPINE WITHOUT CONTRAST TECHNIQUE: Multiplanar, multisequence MR imaging of the lumbar spine was performed. No intravenous contrast was administered. COMPARISON:  MRI lumbar spine dated May 19, 2018. FINDINGS: Segmentation:  Unchanged sacralization of L5. Alignment:  Physiologic. Vertebrae: No fracture, evidence of discitis, or bone lesion. Unchanged right facet and perifacet edema at L3-L4 extending into both right L3 and L4 pedicles. Unchanged small facet joint effusion. Unchanged 1.1 x 0.7 x 3.1 cm fluid collection extending posteriorly and superiorly from facet joint. Unchanged surrounding soft tissue edema. Conus medullaris and cauda equina: Conus extends to the T12-L1 level. Conus and cauda equina appear normal. Paraspinal and other soft tissues: Cholelithiasis. Disc levels: T11-T12: Negative. T12-L1:  Negative. L1-L2:  Negative. L2-L3:  Negative. L3-L4: Unchanged shallow disc bulge small superimposed left subarticular and foraminal disc protrusion. Unchanged abnormal low T1 signal in the epidural fat along the posterior and right lateral thecal sac. 5 x 5 x 10 mm fluid collection in the posterior epidural space is unchanged. Unchanged mild right lateral recess and neuroforaminal stenosis. No spinal canal or left neuroforaminal stenosis. L4-L5:  Negative. L5-S1:  Negative. IMPRESSION: 1. Stable inflammatory changes surrounding the right L3-L4 facet joint with infiltration/effacement of the epidural fat along the right and posterior aspects of the thecal sac. Unchanged 3.1  cm fluid collection in the posterior right paraspinous soft tissues and 1.0 cm fluid collection in the posterior epidural space. These could represent synovial cysts or abscesses. 2. Unchanged mild right lateral recess and neuroforaminal stenosis at L3-L4. 3. Cholelithiasis. Electronically Signed   By: Obie DredgeWilliam T Derry M.D.   On: 05/27/2018 13:43      Scheduled Meds: . atorvastatin  20 mg Oral q1800  . baclofen  5 mg Oral BID  . docusate sodium  100 mg Oral BID  . FLUoxetine  60 mg Oral q morning - 10a  . gabapentin  600 mg Oral TID  . Melatonin  9 mg Oral  QHS  . metroNIDAZOLE  500 mg Oral Q8H  . nicotine  14 mg Transdermal Daily  . QUEtiapine  300 mg Oral QHS   Continuous Infusions: . cefTRIAXone (ROCEPHIN)  IV       LOS: 5 days    Time spent: 20 minutes   Noralee Stain, DO [facility] [email or web address] 05/28/2018, 10:37 AM

## 2018-05-29 DIAGNOSIS — F418 Other specified anxiety disorders: Secondary | ICD-10-CM

## 2018-05-29 DIAGNOSIS — M4646 Discitis, unspecified, lumbar region: Secondary | ICD-10-CM

## 2018-05-29 DIAGNOSIS — K047 Periapical abscess without sinus: Secondary | ICD-10-CM

## 2018-05-29 DIAGNOSIS — F1210 Cannabis abuse, uncomplicated: Secondary | ICD-10-CM

## 2018-05-29 DIAGNOSIS — I10 Essential (primary) hypertension: Secondary | ICD-10-CM

## 2018-05-29 DIAGNOSIS — F17200 Nicotine dependence, unspecified, uncomplicated: Secondary | ICD-10-CM

## 2018-05-29 LAB — AEROBIC/ANAEROBIC CULTURE W GRAM STAIN (SURGICAL/DEEP WOUND): Culture: NO GROWTH

## 2018-05-29 LAB — AEROBIC/ANAEROBIC CULTURE (SURGICAL/DEEP WOUND): GRAM STAIN: NONE SEEN

## 2018-05-29 MED ORDER — OXYCODONE HCL 5 MG PO TABS: 5.0000 mg | ORAL_TABLET | ORAL | 0 refills | Status: DC | PRN

## 2018-05-29 MED ORDER — SODIUM CHLORIDE 0.9% FLUSH: 10.0000 mL | Freq: Two times a day (BID) | INTRAVENOUS | Status: DC

## 2018-05-29 MED ORDER — TRAZODONE HCL 50 MG PO TABS: 50.0000 mg | ORAL_TABLET | Freq: Every evening | ORAL | Status: DC | PRN

## 2018-05-29 MED ORDER — METRONIDAZOLE 500 MG PO TABS: 500.0000 mg | ORAL_TABLET | Freq: Three times a day (TID) | ORAL | 0 refills | Status: DC

## 2018-05-29 MED ORDER — ACETAMINOPHEN 325 MG PO TABS: 650.0000 mg | ORAL_TABLET | Freq: Four times a day (QID) | ORAL | Status: DC | PRN

## 2018-05-29 MED ORDER — NICOTINE 14 MG/24HR TD PT24: 14.0000 mg | MEDICATED_PATCH | Freq: Every day | TRANSDERMAL | 0 refills | Status: DC

## 2018-05-29 MED ORDER — CEFTRIAXONE IV (FOR PTA / DISCHARGE USE ONLY): 2.0000 g | INTRAVENOUS | 0 refills | Status: AC

## 2018-05-29 MED ORDER — METRONIDAZOLE 500 MG PO TABS: 500.0000 mg | ORAL_TABLET | Freq: Three times a day (TID) | ORAL | 0 refills | Status: AC

## 2018-05-29 MED ORDER — CEFTRIAXONE IV (FOR PTA / DISCHARGE USE ONLY): 2.0000 g | INTRAVENOUS | 0 refills | Status: DC

## 2018-05-29 MED ORDER — BACLOFEN 10 MG PO TABS: 5.0000 mg | ORAL_TABLET | Freq: Two times a day (BID) | ORAL | 0 refills | Status: DC

## 2018-05-29 MED ORDER — SODIUM CHLORIDE 0.9% FLUSH: 10.0000 mL | INTRAVENOUS | Status: DC | PRN

## 2018-05-29 MED ORDER — TRAMADOL HCL 50 MG PO TABS: 50.0000 mg | ORAL_TABLET | Freq: Four times a day (QID) | ORAL | 0 refills | Status: DC | PRN

## 2018-05-29 MED ORDER — DOCUSATE SODIUM 100 MG PO CAPS: 100.0000 mg | ORAL_CAPSULE | Freq: Two times a day (BID) | ORAL | 0 refills | Status: DC

## 2018-05-29 MED FILL — traMADol HCL 50 MG TABS: 50 | 5 days supply | Qty: 20 | Fill #0

## 2018-05-29 MED FILL — BACLOFEN 10 MG TABS: 10 | 30 days supply | Qty: 30 | Fill #0

## 2018-05-29 MED FILL — DOK 100 MG CAPS: 100 | 5 days supply | Qty: 10 | Fill #0

## 2018-05-29 MED FILL — metroNIDAZOLE 500 MG TABS: 500 | 41 days supply | Qty: 123 | Fill #0

## 2018-05-29 MED FILL — oxyCODONE HCL 5 MG TABS: 5 | 5 days supply | Qty: 30 | Fill #0

## 2018-05-29 NOTE — Progress Notes (Signed)
Peripherally Inserted Central Catheter/Midline Placement  The IV Nurse has discussed with the patient and/or persons authorized to consent for the patient, the purpose of this procedure and the potential benefits and risks involved with this procedure.  The benefits include less needle sticks, lab draws from the catheter, and the patient may be discharged home with the catheter. Risks include, but not limited to, infection, bleeding, blood clot (thrombus formation), and puncture of an artery; nerve damage and irregular heartbeat and possibility to perform a PICC exchange if needed/ordered by physician.  Alternatives to this procedure were also discussed.  Bard Power PICC patient education guide, fact sheet on infection prevention and patient information card has been provided to patient /or left at bedside.    PICC/Midline Placement Documentation  PICC Single Lumen 05/29/18 PICC Right Basilic 38 cm 1 cm (Active)  Indication for Insertion or Continuance of Line Home intravenous therapies (PICC only) 05/29/2018  5:35 PM  Exposed Catheter (cm) 1 cm 05/29/2018  5:35 PM  Site Assessment Clean;Dry;Intact 05/29/2018  5:35 PM  Line Status Flushed;Blood return noted 05/29/2018  5:35 PM  Dressing Type Transparent 05/29/2018  5:35 PM  Dressing Status Clean;Dry;Intact;New drainage 05/29/2018  5:35 PM  Dressing Intervention New dressing 05/29/2018  5:35 PM  Dressing Change Due 06/05/18 05/29/2018  5:35 PM       Mary Ewing 05/29/2018, 5:35 PM

## 2018-05-29 NOTE — Progress Notes (Signed)
Physical Therapy Treatment Patient Details Name: Mary Ewing MRN: [id number] DOB: 05-07-1970 Today's Date: 05/29/2018    History of Present Illness Pt is a 48 y/o female admitted secondary to back pain and found to have acute lumbar osteomyelitis. PMH including but not limited to substance abuse, HLD, and depression with SI.    PT Comments    Pt remains limited secondary to consistent back pain. Only agreeable to limited ambulation in hallway and required several brief standing rest breaks secondary to pain. PT will continue to follow acutely to progress mobility as tolerated.    Follow Up Recommendations  No PT follow up     Equipment Recommendations  None recommended by PT    Recommendations for Other Services       Precautions / Restrictions Precautions Precautions: Fall Restrictions Weight Bearing Restrictions: No    Mobility  Bed Mobility Overal bed mobility: Modified Independent                Transfers Overall transfer level: Modified independent Equipment used: None                Ambulation/Gait Ambulation/Gait assistance: Min guard;Supervision Gait Distance (Feet): 120 Feet Assistive device: None Gait Pattern/deviations: Step-through pattern Gait velocity: decreased   General Gait Details: pt limited secondary to pain and fatigue; pt requiring several standing rest breaks throughout while holding onto hand rail   Stairs             Wheelchair Mobility    Modified Rankin (Stroke Patients Only)       Balance Overall balance assessment: Needs assistance Sitting-balance support: Feet supported Sitting balance-Leahy Scale: Good     Standing balance support: No upper extremity supported Standing balance-Leahy Scale: Fair                              Cognition Arousal/Alertness: Awake/alert Behavior During Therapy: WFL for tasks assessed/performed Overall Cognitive Status: Within Functional Limits for tasks  assessed                                        Exercises      General Comments        Pertinent Vitals/Pain Pain Assessment: 0-10 Pain Score: 7  Pain Location: back Pain Descriptors / Indicators: Guarding;Grimacing;Discomfort Pain Intervention(s): Monitored during session;Repositioned    Home Living                      Prior Function            PT Goals (current goals can now be found in the care plan section) Acute Rehab PT Goals PT Goal Formulation: With patient Time For Goal Achievement: 06/08/18 Potential to Achieve Goals: Good Progress towards PT goals: Progressing toward goals    Frequency    Min 3X/week      PT Plan Current plan remains appropriate    Co-evaluation              AM-PAC PT "6 Clicks" Mobility   Outcome Measure  Help needed turning from your back to your side while in a flat bed without using bedrails?: None Help needed moving from lying on your back to sitting on the side of a flat bed without using bedrails?: None Help needed moving to and from a bed to a chair (including  a wheelchair)?: None Help needed standing up from a chair using your arms (e.g., wheelchair or bedside chair)?: None Help needed to walk in hospital room?: None Help needed climbing 3-5 steps with a railing? : A Little 6 Click Score: 23    End of Session   Activity Tolerance: Patient limited by pain Patient left: in bed;with call bell/phone within reach Nurse Communication: Mobility status PT Visit Diagnosis: Other abnormalities of gait and mobility (R26.89);Pain Pain - part of body: (back)     Time: 1010-1021 PT Time Calculation (min) (ACUTE ONLY): 11 min  Charges:  $Gait Training: 8-22 mins                     Deborah Chalk, [place], DPT  Acute Rehabilitation Services Pager [phone] Office [phone]     Mary Ewing 05/29/2018, 11:56 AM

## 2018-05-29 NOTE — Progress Notes (Signed)
NURSING PROGRESS NOTE  Mary Ewing [id number] Discharge Data: 05/29/2018 7:56 PM Attending Provider: Debbe Odea, MD EUM:PNTIRWE, Jannifer Rodney, MD     [name] to be D/C'd Home per MD order.  Discussed with the patient the After Visit Summary and all questions fully answered. All IV's discontinued with no bleeding noted. All belongings returned to patient for patient to take home.   Last Vital Signs:  Blood pressure 130/60, pulse 73, temperature 98.5 F (36.9 C), temperature source Oral, resp. rate 20, height _0  (1.575 m), weight 81.6 kg, last menstrual period 05/09/2018, SpO2 100 %.  Discharge Medication List Allergies as of 05/29/2018      Reactions   Sulfonamide Derivatives Hives      Medication List    STOP taking these medications   methocarbamol 500 MG tablet Commonly known as:  ROBAXIN   phenylephrine 10 MG Tabs tablet Commonly known as:  SUDAFED PE     TAKE these medications   acetaminophen 325 MG tablet Commonly known as:  TYLENOL Take 2 tablets (650 mg total) by mouth every 6 (six) hours as needed for mild pain (or Fever >/= 101).   aspirin-acetaminophen-caffeine 250-250-65 MG tablet Commonly known as:  EXCEDRIN MIGRAINE Take 1 tablet by mouth every 8 (eight) hours as needed for headache.   baclofen 10 MG tablet Commonly known as:  LIORESAL Take 0.5 tablets (5 mg total) by mouth 2 (two) times daily.   cefTRIAXone  IVPB Commonly known as:  ROCEPHIN Inject 2 g into the vein daily. 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   FLUoxetine 20 MG tablet Commonly known as:  PROZAC Take 60 mg by mouth every morning.   fluticasone 50 MCG/ACT nasal spray Commonly known as:  FLONASE Place 2 sprays into both nostrils daily.   gabapentin  300 MG capsule Commonly known as:  NEURONTIN Take 600 mg by mouth 3 (three) times daily.   LIPITOR 20 MG tablet Generic drug:  atorvastatin Take 20 mg by mouth daily at 6 PM.   Melatonin 10 MG Tabs Take 10 mg by mouth at bedtime.   metroNIDAZOLE 500 MG tablet Commonly known as:  FLAGYL Take 1 tablet (500 mg total) by mouth 3 (three) times daily.   nicotine 14 mg/24hr patch Commonly known as:  NICODERM CQ - dosed in mg/24 hours Place 1 patch (14 mg total) onto the skin daily. Start taking on:  May 30, 2018   oxyCODONE 5 MG immediate release tablet Commonly known as:  Oxy IR/ROXICODONE Take 1 tablet (5 mg total) by mouth every 4 (four) hours as needed for severe pain or breakthrough pain.   QUEtiapine 300 MG tablet Commonly known as:  SEROQUEL Take 300 mg by mouth at bedtime.   THERA-M Tabs Take 1 tablet by mouth daily.   traMADol 50 MG tablet Commonly known as:  ULTRAM Take 1 tablet (50 mg total) by mouth every 6 (six) hours as needed for moderate pain.   traZODone 50 MG tablet Commonly known as:  DESYREL Take 1-2 tablets (50-100 mg total) by mouth at bedtime as needed for sleep.            Home Infusion Instuctions  (From admission, onward)  Start     Ordered   05/29/18 0000  Home infusion instructions [facility] May follow [place] Dosing Protocol; May administer Cathflo as needed to maintain patency of vascular access device.; Flushing of vascular access device: per [facility] Protocol: 0.9% NaCl pre/post medica...    Question Answer Comment  Instructions May follow [place] Dosing Protocol   Instructions May administer Cathflo as needed to maintain patency of vascular access device.   Instructions Flushing of vascular access device: per [facility] Protocol: 0.9% NaCl pre/post medication administration and prn patency; Heparin 100 u/ml, 5m for implanted ports and Heparin 10u/ml, 596mfor all other central venous catheters.   Instructions May  follow [facility] Anaphylaxis Protocol for First Dose Administration in the home: 0.9% NaCl at 25-50 ml/hr to maintain IV access for protocol meds. Epinephrine 0.3 ml IV/IM PRN and Benadryl 25-50 IV/IM PRN s/s of anaphylaxis.   Instructions [facility] Infusion Coordinator (RN) to assist per patient IV care needs in the home PRN.      05/29/18 1518           Durable Medical Equipment  (From admission, onward)         Start     Ordered   05/29/18 1704  For home use only DME 3 n 1  Once     05/29/18 1703

## 2018-05-29 NOTE — Progress Notes (Signed)
Nutrition Follow-up  DOCUMENTATION CODES:   Obesity unspecified  INTERVENTION:  Continue regular diet with thin liquids.   Encourage adequate PO intake.  NUTRITION DIAGNOSIS:   Increased nutrient needs related to acute illness(osteomyelitis) as evidenced by estimated needs; ongoing  GOAL:   Patient will meet greater than or equal to 90% of their needs; met  MONITOR:   PO intake, Labs, Weight trends, I & O's, Skin  REASON FOR ASSESSMENT:   Consult Assessment of nutrition requirement/status  ASSESSMENT:   Pt with PMH of substance abused, HLD, depression, and GERD. Pt also had 2 abscessed teeth that were not pulled but she was on antibiotics. Presented with acute lumbar osteomyelitis.   Per MD, plans for 6 week course of antibiotics for discitis. Meal completion has been 100%. Pt reports having a good appetite with no other difficulties. Pt has been consuming most of all of her food at meals. Pt reports no concerns or requests at this time. Recommend continuation of regular diet. RD to order supplements as appropriate especially if po intake becomes poor.   Labs and medications reviewed.   Diet Order:   Diet Order            Diet regular Room service appropriate? Yes; Fluid consistency: Thin  Diet effective now              EDUCATION NEEDS:   Education needs have been addressed  Skin:  Skin Assessment: Skin Integrity Issues: Skin Integrity Issues:: Incisions Incisions: back  Last BM:  2/4  Height:   Ht Readings from Last 1 Encounters:  05/23/18 5' 2" (1.575 m)    Weight:   Wt Readings from Last 1 Encounters:  05/23/18 81.6 kg    Ideal Body Weight:  50 kg  BMI:  Body mass index is 32.92 kg/m.  Estimated Nutritional Needs:   Kcal:  1650-1850  Protein:  85-100 grams  Fluid:  1.6 - 1.8 L/day    Corrin Parker, MS, RD, LDN Pager # [phone] After hours/ weekend pager # [phone]

## 2018-05-29 NOTE — Discharge Summary (Signed)
Physician Discharge Summary  Mary Ewing:[id number] DOB: March 23, 1971 DOA: 05/23/2018  PCP: Katherina Mires, MD  Admit date: 05/23/2018 Discharge date: 05/29/2018  Admitted From: home  Disposition:  home   Recommendations for Outpatient Follow-up:  1. PICC to be removed after antibiotics finished on 3/16 2. Need dental follow up  Home Health:  ordered Equipment/Devices:  PICC line    Discharge Condition:  stable   CODE STATUS:  Full code   Consultations:  ID    Discharge Diagnoses:  Principal Problem:   Facet joint disease of lumbosacral region Active Problems: Dental carries   TOBACCO ABUSE   Depression with anxiety   Opioid dependence in remission ([place])   HLD (hyperlipidemia)   Essential hypertension   Marijuana abuse        Brief Summary: Mary Ewing a 48 y.o.femalewith medical history significant ofh/o substance abuse, HLD, and depression with SIpresenting as a direct admission from Dr. Annette Stable.She woke up on Christmas Eve AM with terrible low back pain. There was nothing she could do to make it better. It has gotten progressively worse and her right leg has given out on her a few times, causing her to fall down. 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 She has never required surgery. Intermittent fevers, as high as 102.8. Her last fever was maybe a week ago. She has also had 2 abscessed teeth, including one a week or two before Christmas; she has not had them pulled but did get a couple of rounds of antibiotics. She presented to neurosurgery office with lumbar osteomyelitis with back pain. Sent to the hospital fo facet joint aspiration which was done by IR. ID consulted.  1/31>  aspiration of L3-4 facet - 2 drops of bloody fluid obtained  MRI 05/27/18 > 1. Stable inflammatory changes surrounding the right L3-L4 facet joint with infiltration/effacement of the epidural fat along the right and posterior aspects of the thecal sac. Unchanged 3.1 cm  fluid collection in the posterior right paraspinous soft tissues and 1.0 cm fluid collection in the posterior epidural space.  Hospital Course:  Culture negative discitis, L3-L4- Dental caries - source of discitis suspected to be her teeth - Panore[employer] noted multiple dental caries- see below - gram stain and culture of aspirate found to be negative - plan per ID>> Ceftriaxone and Flagyl for 6 wks- started on 05/28/18 -- case manager has given her some contact numbers for dentist- she states will f/u with a dentist when she can "get the cash together" (quoted 1600 at one office)- we have no dental coverage in the hospital until 2/18- I will give her the contact for Dr Ritta Slot office   Cholelithiasis - noted on MRI  HLD - Lipitor  Depression anxiety - Prozac, Seroquel and Trazodone  Tobacco abuse - encouraged to stop smoking  H/o opiod abuse, Marijuana abuse - UDS + for only [facility]  Discharge Exam: Vitals:   05/29/18 0736 05/29/18 1351  BP: 115/61 126/73  Pulse: 70 75  Resp: 18 18  Temp: 98.5 F (36.9 C) 98.9 F (37.2 C)  SpO2: 99% 100%   Vitals:   05/28/18 2355 05/29/18 0441 05/29/18 0736 05/29/18 1351  BP: (!) 101/47 102/66 115/61 126/73  Pulse: 72 64 70 75  Resp: '18 18 18 18  ' Temp: 97.6 F (36.4 C) 98.1 F (36.7 C) 98.5 F (36.9 C) 98.9 F (37.2 C)  TempSrc: Oral Oral Oral Oral  SpO2: 97% 99% 99% 100%  Weight:      Height:        General: Pt is alert, awake, not in acute distress Cardiovascular: RRR, S1/S2 +, no rubs, no gallops Respiratory: CTA bilaterally, no wheezing, no rhonchi Abdominal: Soft, NT, ND, bowel sounds + Extremities: no edema, no cyanosis   Discharge Instructions  Discharge Instructions    Diet - low sodium heart healthy   Complete by:  As directed    Home infusion instructions [facility] May follow [place] Dosing Protocol; May administer Cathflo as needed to maintain patency of vascular access device.; Flushing of vascular  access device: per [facility] Protocol: 0.9% NaCl pre/post medica...   Complete by:  As directed    Instructions:  May follow [place] Dosing Protocol   Instructions:  May administer Cathflo as needed to maintain patency of vascular access device.   Instructions:  Flushing of vascular access device: per [facility] Protocol: 0.9% NaCl pre/post medication administration and prn patency; Heparin 100 u/ml, 89m for implanted ports and Heparin 10u/ml, 564mfor all other central venous catheters.   Instructions:  May follow [facility] Anaphylaxis Protocol for First Dose Administration in the home: 0.9% NaCl at 25-50 ml/hr to maintain IV access for protocol meds. Epinephrine 0.3 ml IV/IM PRN and Benadryl 25-50 IV/IM PRN s/s of anaphylaxis.   Instructions:  Ad[facility]risnfusion Coordinator (RN) to assist per patient IV care needs in the home PRN.   Increase activity slowly   Complete by:  As directed      Allergies as of 05/29/2018      Reactions   Sulfonamide Derivatives Hives      Medication List    STOP taking these medications   methocarbamol 500 MG tablet Commonly known as:  ROBAXIN   phenylephrine 10 MG Tabs tablet Commonly known as:  SUDAFED PE     TAKE these medications   acetaminophen 325 MG tablet Commonly known as:  TYLENOL Take 2 tablets (650 mg total) by mouth every 6 (six) hours as needed for mild pain (or Fever >/= 101).   aspirin-acetaminophen-caffeine 250-250-65 MG tablet Commonly known as:  EXCEDRIN MIGRAINE Take 1 tablet by mouth every 8 (eight) hours as needed for headache.   baclofen 10 MG tablet Commonly known as:  LIORESAL Take 0.5 tablets (5 mg total) by mouth 2 (two) times daily.   cefTRIAXone  IVPB Commonly known as:  ROCEPHIN Inject 2 g into the vein daily. Indication:  Culture negative discitis Last Day of Therapy:  07/08/2018 Labs - Once weekly:  CBC/D and BMP, Labs - Every other week:  ESR and CRP   docusate sodium 100 MG capsule Commonly known as:  COLACE Take  1 capsule (100 mg total) by mouth 2 (two) times daily.   FLUoxetine 20 MG tablet Commonly known as:  PROZAC Take 60 mg by mouth every morning.   fluticasone 50 MCG/ACT nasal spray Commonly known as:  FLONASE Place 2 sprays into both nostrils daily.   gabapentin 300 MG capsule Commonly known as:  NEURONTIN Take 600 mg by mouth 3 (three) times daily.   LIPITOR 20 MG tablet Generic drug:  atorvastatin Take 20 mg by mouth daily at 6 PM.   Melatonin 10 MG Tabs Take 10 mg by mouth at bedtime.   metroNIDAZOLE 500 MG tablet Commonly known as:  FLAGYL Take 1 tablet (500 mg total) by mouth 3 (three) times daily.   nicotine 14 mg/24hr patch Commonly known as:  NICODERM CQ - dosed in  mg/24 hours Place 1 patch (14 mg total) onto the skin daily. Start taking on:  May 30, 2018   oxyCODONE 5 MG immediate release tablet Commonly known as:  Oxy IR/ROXICODONE Take 1 tablet (5 mg total) by mouth every 4 (four) hours as needed for severe pain or breakthrough pain.   QUEtiapine 300 MG tablet Commonly known as:  SEROQUEL Take 300 mg by mouth at bedtime.   THERA-M Tabs Take 1 tablet by mouth daily.   traMADol 50 MG tablet Commonly known as:  ULTRAM Take 1 tablet (50 mg total) by mouth every 6 (six) hours as needed for moderate pain.   traZODone 50 MG tablet Commonly known as:  DESYREL Take 1-2 tablets (50-100 mg total) by mouth at bedtime as needed for sleep.            Home Infusion Instuctions  (From admission, onward)         Start     Ordered   05/29/18 0000  Home infusion instructions [facility] May follow [place] Dosing Protocol; May administer Cathflo as needed to maintain patency of vascular access device.; Flushing of vascular access device: per [facility] Protocol: 0.9% NaCl pre/post medica...    Question Answer Comment  Instructions May follow [facility] Dosing Protocol   Instructions May administer Cathflo as needed to maintain patency of vascular access  device.   Instructions Flushing of vascular access device: per [facility] Protocol: 0.9% NaCl pre/post medication administration and prn patency; Heparin 100 u/ml, 51m for implanted ports and Heparin 10u/ml, 565mfor all other central venous catheters.   Instructions May follow [facility] Anaphylaxis Protocol for First Dose Administration in the home: 0.9% NaCl at 25-50 ml/hr to maintain IV access for protocol meds. Epinephrine 0.3 ml IV/IM PRN and Benadryl 25-50 IV/IM PRN s/s of anaphylaxis.   Instructions [facility] Infusion Coordinator (RN) to assist per patient IV care needs in the home PRN.      05/29/18 1518         Follow-up Information    KuLenn CalDDS Follow up.   Specialty:  Dentistry Why:  for dental care. Contact information: [phone]        VaTommy MedalCoLavell IslamMD Follow up.   Specialty:  Infectious Diseases Why:  You have and appt on June 25, 2018 at 2:45 PM at the [facility] on [address], [address] with Dr. VaTommy Medalontact information: [phone]        Allergies  Allergen Reactions  . Sulfonamide Derivatives Hives     Procedures/Studies:  Dg Orthopantogram  Result Date: 05/27/2018 CLINICAL DATA:  Patient complains of 2 abscessed teeth and TMJ issues x couple years now. Patient states she only has about 10 teeth left and that some of them are cracked. Patient states they are trying to rule out infection from spine. Current smoker EXAM: ORTHOPANTOGRAM/PANORAMIC COMPARISON:  Cervical spine radiographs 04/22/2015 FINDINGS: Multiple prior tooth extractions or resorptions. The remaining teeth demonstrate multiple dental caries. Periapical lucencies have multiple teeth suggesting periodontal disease. No acute fracture or dislocation of the mandible. IMPRESSION: Multiple dental caries and periapical lucencies consistent  with periodontal disease. Multiple prior tooth extractions or resorption. Electronically Signed   By: WiLucienne Capers.D.   On: 05/27/2018 22:22   Mr Lumbar Spine Wo Contrast  Result Date: 05/27/2018 CLINICAL DATA:  Possible right L3-L4 septic arthritis status post aspiration. Persistent back pain with bilateral leg numbness and weakness. EXAM: MRI LUMBAR SPINE WITHOUT CONTRAST TECHNIQUE: Multiplanar, multisequence MR imaging of the lumbar spine was performed. No intravenous contrast was administered. COMPARISON:  MRI lumbar spine dated May 19, 2018. FINDINGS: Segmentation:  Unchanged sacralization of L5. Alignment:  Physiologic. Vertebrae: No fracture, evidence of discitis, or bone lesion. Unchanged right facet and perifacet edema at L3-L4 extending into both right L3 and L4 pedicles. Unchanged small facet joint effusion. Unchanged 1.1 x 0.7 x 3.1 cm fluid collection extending posteriorly and superiorly from facet joint. Unchanged surrounding soft tissue edema. Conus medullaris and cauda equina: Conus extends to the T12-L1 level. Conus and cauda equina appear normal. Paraspinal and other soft tissues: Cholelithiasis. Disc levels: T11-T12: Negative. T12-L1:  Negative. L1-L2:  Negative. L2-L3:  Negative. L3-L4: Unchanged shallow disc bulge small superimposed left subarticular and foraminal disc protrusion. Unchanged abnormal low T1 signal in the epidural fat along the posterior and right lateral thecal sac. 5 x 5 x 10 mm fluid collection in the posterior epidural space is unchanged. Unchanged mild right lateral recess and neuroforaminal stenosis. No spinal canal or left neuroforaminal stenosis. L4-L5:  Negative. L5-S1:  Negative. IMPRESSION: 1. Stable inflammatory changes surrounding the right L3-L4 facet joint with infiltration/effacement of the epidural fat along the right and posterior aspects of the thecal sac. Unchanged 3.1 cm fluid collection in the posterior right paraspinous soft tissues and 1.0 cm  fluid collection in the posterior epidural space. These could represent synovial cysts or abscesses. 2. Unchanged mild right lateral recess and neuroforaminal stenosis at L3-L4. 3. Cholelithiasis. Electronically Signed   By: Titus Dubin M.D.   On: 05/27/2018 13:43   Mr Lumbar Spine Wo Contrast  Addendum Date: 05/19/2018   ADDENDUM REPORT: 05/19/2018 17:44 ADDENDUM: The original report was by Dr. Van Clines. The following addendum is by Dr. Van Clines: We have had some difficulty contacting the on-call provider for Dr. Ericka Pontiff office. Several messages were left but have not been returned, possibly from some sort of malfunction in the answering service. In order to avert the unexpected possibility of any severe issue overnight, I called Ms. Bywater on her cellphone at approximately 5:30 p.m. on 05/19/2018 and we discussed the findings of an inflamed joint in her spine, along with the possibility of infection. She indicated that she believes she has had some fevers over the last several weeks and that her pain has been increasing. I told her that given the duration of for symptoms, unless she has significant fever tonight or significant progression of her back pain/weakness, it is likely safe to wait and speak to Dr. Ericka Pontiff office in the morning once we have had the opportunity to call the report. However, I cautioned her several times that were she to experience severe worsening of the back pain, significant fevers, or new weakness in the right lower extremity that those would be reasons to proceed to a local emergency room immediately. She understood and was agreeable to this course of action. Our current plan is to phone the report impressions to Dr. Ericka Pontiff office at earliest opportunity in the morning once they his office opens. Electronically Signed   By: Van Clines M.D.   On: 05/19/2018 17:44   Result Date: 05/19/2018 CLINICAL DATA:  Bilateral low back pain, weakness,  numbness in legs since December 2019 EXAM: MRI LUMBAR SPINE WITHOUT CONTRAST TECHNIQUE: Multiplanar, multisequence MR imaging of the lumbar spine was performed. No  intravenous contrast was administered. COMPARISON:  Multiple exams, including 05/09/2015 MRI FINDINGS: Segmentation: Correlating with prior CT abdomen and chest radiographs, there are 12 paired thoracic ribs, along with 5 lumbar type non-rib-bearing vertebra, the lowest of which is partially sacralized. Accordingly, the sacralized vertebra is believed to represent L5. I will be changing the numbering strategy from the prior lumbar spine MRI which had previously labeled this vertebra as S1. To reiterate, for today's exam the partially sacralized lowest lumbar vertebra is L5. Alignment:  No vertebral subluxation is observed. Vertebrae: Right facet and perifacet edema at the L3-4 level, significantly worsened from 05/09/2015, with associated edema extending into the pedicles at both vertebral levels, a with a small fluid collection measuring 3.1 by 0.7 by 0.7 cm (volume = 0.8 cm^3) extending posteriorly and superiorly from the facet joint. Presumably this represents a synovial cyst. There is edema in the adjacent paraspinal tissues and a right facet joint effusion is present at this level. There is subtle adjacent interspinous edema at L3-4 as well. Conus medullaris and cauda equina: Conus extends to the T12-L1 level. Conus and cauda equina appear normal. Paraspinal and other soft tissues: Paraspinal edema on the right around the L3-4 facet joint as noted above. Otherwise unremarkable. Disc levels: T12-L1: Unremarkable. L1-2: Unremarkable. L2-3: Unremarkable. L3-4: There is abnormal low T1 signal infiltrating the epidural adipose tissue along the right side of the thecal sac and right lateral recess as shown on images 29 through 31 of series 8. Presumably this is associated with the facet arthropathy. A CT exclude a small synovial cyst along the medial  margin of the facet extending into the lateral recess on image 29/7. A small epidural abscess in the lateral recess is a differential diagnostic consideration. No obvious cortical destruction along the facet joint. Mild right foraminal stenosis and right subarticular lateral recess stenosis due to facet arthropathy and the above findings. L4-5: Unremarkable L5-S1: Unremarkable.  Very small intervertebral disc. IMPRESSION: 1. First of all, the numbering system is being revised from the prior MRI, such that L5 is the partially sacralized vertebra at the lumbosacral junction. This is felt to more accurately fit the patient's anatomic situation of 12 thoracic ribs and 5 lumbar type vertebra, the lowest of which is sacralized. 2. Abnormal new facet effusion on the right at L3-4 with extensive perifacet and paraspinal edema, a nearly 1 cc fluid collection extending posteriorly and superiorly from the facet joint, considerable marrow edema extending into the pedicles, and also infiltration/effacement of the adjacent epidural adipose tissues along the right side of the thecal sac and in the right lateral recess at the intervertebral level. There is a suggestion of a small fluid collection in the right lateral recess which could be a synovial cyst or abscess. Based on these findings, septic facet joint on the right at L3-4 is a distinct possibility. No definite confirmatory erosive findings along the facets. Correlate with the patient's symptoms and any clinical indicators of infection in determining appropriate next steps. There does appear to be mild right foraminal and mild right subarticular lateral recess stenosis at L3-4 partially due to the small fluid collection in the right lateral recess and partially from facet arthropathy. Radiology assistant personnel have been notified to put me in telephone contact with the referring physician or the referring physician's clinical representative in order to discuss these  findings. Once this communication is established I will issue an addendum to this report for documentation purposes. Electronically Signed: By: Van Clines M.D. On:  05/19/2018 15:15   Ct Aspiration  Result Date: 05/24/2018 INDICATION: Suspected septic right L3-4 facet arthritis EXAM: CT-GUIDED ASPIRATION MEDICATIONS: The patient is currently admitted to the hospital and receiving intravenous antibiotics. The antibiotics were administered within an appropriate time frame prior to the initiation of the procedure. ANESTHESIA/SEDATION: Fentanyl 75 mcg IV; Versed 1.5 mg IV Moderate Sedation Time:  10 minutes The patient was continuously monitored during the procedure by the interventional radiology nurse under my direct supervision. COMPLICATIONS: None immediate. PROCEDURE: Informed written consent was obtained from the patient after a thorough discussion of the procedural risks, benefits and alternatives. All questions were addressed. Maximal Sterile Barrier Technique was utilized including caps, mask, sterile gowns, sterile gloves, sterile drape, hand hygiene and skin antiseptic. A timeout was performed prior to the initiation of the procedure. 1% lidocaine was utilized for local anesthesia. Under CT guidance, an 18 gauge needle was directed towards the superior aspect of the right L3-4 facet joint. Aspiration yielded 2 drops of serosanguineous fluid which was sent for culture. FINDINGS: Images document needle placement within the region of the right L3-4 facet joint and posterior soft tissues. IMPRESSION: Successful CT-guided aspiration as described yielding 2 drops serosanguineous fluid. Electronically Signed   By: Marybelle Killings M.D.   On: 05/24/2018 15:42   [place] Ekg Site Rite  Result Date: 05/28/2018 If Site Rite image not attached, placement could not be confirmed due to current cardiac rhythm.    The results of significant diagnostics from this hospitalization (including imaging, microbiology,  ancillary and laboratory) are listed below for reference.     Microbiology: Recent Results (from the past 240 hour(s))  Culture, blood (routine x 2)     Status: None   Collection Time: 05/23/18  4:17 PM  Result Value Ref Range Status   Specimen Description BLOOD RIGHT ANTECUBITAL  Final   Special Requests   Final    BOTTLES DRAWN AEROBIC AND ANAEROBIC Blood Culture adequate volume   Culture   Final    NO GROWTH 5 DAYS Performed at [facility], [address] [place], [place] [address]    Report Status 05/28/2018 FINAL  Final  Culture, blood (routine x 2)     Status: None   Collection Time: 05/23/18  4:26 PM  Result Value Ref Range Status   Specimen Description BLOOD LEFT HAND  Final   Special Requests   Final    BOTTLES DRAWN AEROBIC AND ANAEROBIC Blood Culture adequate volume   Culture   Final    NO GROWTH 5 DAYS Performed at [facility], [place] [address] [place], [place] [address]    Report Status 05/28/2018 FINAL  Final  Aerobic/Anaerobic Culture (surgical/deep wound)     Status: None   Collection Time: 05/24/18  3:11 PM  Result Value Ref Range Status   Specimen Description ABSCESS  Final   Special Requests NONE  Final   Gram Stain NO WBC SEEN NO ORGANISMS SEEN CYTOSPIN SMEAR   Final   Culture   Final    No growth aerobically or anaerobically. Performed at [facility], [place] [address], [place], [place] [address]    Report Status 05/29/2018 FINAL  Final     Labs: BNP (last 3 results) No results for input(s): BNP in the last 8760 hours. Basic Metabolic Panel: Recent Labs  Lab 05/23/18 1545 05/24/18 0832 05/28/18 0550  NA 136 138 139  K 3.9 4.0 3.9  CL 97* 103 107  CO2 26 27  23  GLUCOSE 100* 97 92  BUN '8 10 12  ' CREATININE 1.10* 0.78 0.79  CALCIUM 9.3 9.3 8.8*   Liver Function Tests: Recent Labs  Lab 05/23/18 1545  AST 23  ALT 15  ALKPHOS 90  BILITOT 0.4  PROT 7.9  ALBUMIN 3.7   No results for input(s): LIPASE, AMYLASE in the  last 168 hours. No results for input(s): AMMONIA in the last 168 hours. CBC: Recent Labs  Lab 05/23/18 1545 05/24/18 0832 05/28/18 0550  WBC 8.6 5.7 6.6  NEUTROABS 5.8  --   --   HGB 11.7* 11.6* 11.1*  HCT 37.3 37.0 36.5  MCV 85.4 86.2 86.9  PLT 366 362 233   Cardiac Enzymes: No results for input(s): CKTOTAL, CKMB, CKMBINDEX, TROPONINI in the last 168 hours. BNP: Invalid input(s): POCBNP CBG: No results for input(s): GLUCAP in the last 168 hours. D-Dimer No results for input(s): DDIMER in the last 72 hours. Hgb A1c No results for input(s): HGBA1C in the last 72 hours. Lipid Profile No results for input(s): CHOL, HDL, LDLCALC, TRIG, CHOLHDL, LDLDIRECT in the last 72 hours. Thyroid function studies No results for input(s): TSH, T4TOTAL, T3FREE, THYROIDAB in the last 72 hours.  Invalid input(s): FREET3 Anemia work up No results for input(s): VITAMINB12, FOLATE, FERRITIN, TIBC, IRON, RETICCTPCT in the last 72 hours. Urinalysis    Component Value Date/Time   COLORURINE AMBER (A) 04/09/2014 1314   APPEARANCEUR CLOUDY (A) 04/09/2014 1314   LABSPEC 1.026 04/09/2014 1314   PHURINE 5.5 04/09/2014 1314   GLUCOSEU NEGATIVE 04/09/2014 1314   HGBUR TRACE (A) 04/09/2014 1314   BILIRUBINUR NEGATIVE 04/09/2014 1314   KETONESUR NEGATIVE 04/09/2014 1314   PROTEINUR NEGATIVE 04/09/2014 1314   UROBILINOGEN 0.2 04/09/2014 1314   NITRITE NEGATIVE 04/09/2014 1314   LEUKOCYTESUR NEGATIVE 04/09/2014 1314   Sepsis Labs Invalid input(s): PROCALCITONIN,  WBC,  LACTICIDVEN Microbiology Recent Results (from the past 240 hour(s))  Culture, blood (routine x 2)     Status: None   Collection Time: 05/23/18  4:17 PM  Result Value Ref Range Status   Specimen Description BLOOD RIGHT ANTECUBITAL  Final   Special Requests   Final    BOTTLES DRAWN AEROBIC AND ANAEROBIC Blood Culture adequate volume   Culture   Final    NO GROWTH 5 DAYS Performed at [facility], [address],  [place], [place] [address]    Report Status 05/28/2018 FINAL  Final  Culture, blood (routine x 2)     Status: None   Collection Time: 05/23/18  4:26 PM  Result Value Ref Range Status   Specimen Description BLOOD LEFT HAND  Final   Special Requests   Final    BOTTLES DRAWN AEROBIC AND ANAEROBIC Blood Culture adequate volume   Culture   Final    NO GROWTH 5 DAYS Performed at [facility], [place] [address], [place], [place] [address]    Report Status 05/28/2018 FINAL  Final  Aerobic/Anaerobic Culture (surgical/deep wound)     Status: None   Collection Time: 05/24/18  3:11 PM  Result Value Ref Range Status   Specimen Description ABSCESS  Final   Special Requests NONE  Final   Gram Stain NO WBC SEEN NO ORGANISMS SEEN CYTOSPIN SMEAR   Final   Culture   Final    No growth aerobically or anaerobically. Performed at [facility], [place] [address] [address], [place] [address]    Report Status 05/29/2018 FINAL  Final  Time coordinating discharge in minutes: 65  SIGNED:   Debbe Odea, MD  [facility] 05/29/2018, 3:38 PM Pager   If 7PM-7AM, please contact night-coverage [email or web address] Password [email or web address]

## 2018-05-29 NOTE — Care Management Note (Signed)
Case Management Note  Patient Details  Name: Mary Ewing MRN: [id number] Date of Birth: May 09, 1970  Subjective/Objective:   Pt with facet joint disease and will need 6 weeks of IV abx.  She lives at home with her son and mother.                Action/Plan: CM provided her choice yesterday and she selected [facility]. Pam and Lupita Leash with [facility] aware. Pam did education for d/cing home with IV abx. Per notes ID team comfortable with patient discharging home with IV abx. CM and Pam with [facility] IV therapy discussed with the patient the importance of only using the PICC for abx.  Pt discharging home after PICC placement this pm.  Pt with orders for 3 in 1. Equipment delivered to the room. Pt's discharge meds delivered to the room per [facility] pharmacy. Pt has transportation home.    Expected Discharge Date:  05/29/18               Expected Discharge Plan:  Home w Home Health Services  In-House Referral:     Discharge planning Services  CM Consult  Post Acute Care Choice:  Home Health Choice offered to:  Patient  DME Arranged:  3-N-1 DME Agency:  [facility]  HH Arranged:  RN [facility] Agency:  [facility]  Status of Service:  Completed, signed off  If discussed at Long Length of Stay Meetings, dates discussed:    Additional Comments:  Kermit Balo, RN 05/29/2018, 5:10 PM

## 2018-05-29 NOTE — Progress Notes (Signed)
[facility] Coordinator met with pt and provided hands on teaching regarding IV Rocephin administration in preparation for DC home.  [facility] will be providing [facility] and [facility] nursing services.  [facility] is prepared for DC when hospital team deems pt appropriate.  If patient discharges after hours, please call [phone].   Larry Sierras 05/29/2018, 8:40 AM

## 2018-05-29 NOTE — Progress Notes (Signed)
OT Cancellation Note  Patient Details Name: LUCIANN TEAGLE MRN: [id number] DOB: 08-09-1970  Pt politely declined any participation in therapy. Pt c/o pain in her back and accepted a heat pack.   Cancelled Treatment:    Reason Eval/Treat Not Completed: Patient declined, no reason specified  Crissie Reese OTR/L  05/29/2018, 1:20 PM

## 2018-06-05 ENCOUNTER — Encounter (HOSPITAL_COMMUNITY): Payer: Self-pay | Admitting: Emergency Medicine

## 2018-06-05 ENCOUNTER — Emergency Department (HOSPITAL_COMMUNITY)
Admission: EM | Admit: 2018-06-05 | Discharge: 2018-06-06 | Payer: Medicare Other | Attending: Emergency Medicine | Admitting: Emergency Medicine

## 2018-06-05 ENCOUNTER — Other Ambulatory Visit: Payer: Self-pay

## 2018-06-05 DIAGNOSIS — Z79899 Other long term (current) drug therapy: Secondary | ICD-10-CM | POA: Insufficient documentation

## 2018-06-05 DIAGNOSIS — G8929 Other chronic pain: Secondary | ICD-10-CM | POA: Diagnosis not present

## 2018-06-05 DIAGNOSIS — S0993XA Unspecified injury of face, initial encounter: Secondary | ICD-10-CM | POA: Diagnosis present

## 2018-06-05 DIAGNOSIS — M545 Low back pain: Secondary | ICD-10-CM | POA: Insufficient documentation

## 2018-06-05 DIAGNOSIS — Y9389 Activity, other specified: Secondary | ICD-10-CM | POA: Insufficient documentation

## 2018-06-05 DIAGNOSIS — Y658 Other specified misadventures during surgical and medical care: Secondary | ICD-10-CM | POA: Diagnosis not present

## 2018-06-05 DIAGNOSIS — T829XXA Unspecified complication of cardiac and vascular prosthetic device, implant and graft, initial encounter: Secondary | ICD-10-CM | POA: Diagnosis not present

## 2018-06-05 DIAGNOSIS — S0083XA Contusion of other part of head, initial encounter: Secondary | ICD-10-CM

## 2018-06-05 DIAGNOSIS — Y999 Unspecified external cause status: Secondary | ICD-10-CM | POA: Diagnosis not present

## 2018-06-05 DIAGNOSIS — F17210 Nicotine dependence, cigarettes, uncomplicated: Secondary | ICD-10-CM | POA: Insufficient documentation

## 2018-06-05 DIAGNOSIS — Y929 Unspecified place or not applicable: Secondary | ICD-10-CM | POA: Diagnosis not present

## 2018-06-05 DIAGNOSIS — S40812A Abrasion of left upper arm, initial encounter: Secondary | ICD-10-CM | POA: Diagnosis not present
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
## 2018-06-06 ENCOUNTER — Emergency Department (HOSPITAL_COMMUNITY): Payer: Medicare Other

## 2018-06-06 MED ORDER — OXYCODONE HCL 5 MG PO TABS
5.0000 mg | ORAL_TABLET | Freq: Once | ORAL | Status: AC
  Administered 2018-06-06: 5 mg via ORAL
  Filled 2018-06-06: qty 1

## 2018-06-06 MED ORDER — ASPIRIN-ACETAMINOPHEN-CAFFEINE 250-250-65 MG PO TABS
1.0000 | ORAL_TABLET | Freq: Once | ORAL | Status: AC
  Administered 2018-06-06: 1 via ORAL
  Filled 2018-06-06: qty 1

## 2018-06-06 MED ORDER — HEPARIN SOD (PORK) LOCK FLUSH 100 UNIT/ML IV SOLN
250.0000 [IU] | Freq: Once | INTRAVENOUS | Status: AC
  Administered 2018-06-06: 250 [IU]
  Filled 2018-06-06: qty 5
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
## 2018-06-21 ENCOUNTER — Encounter (HOSPITAL_COMMUNITY): Payer: Self-pay | Admitting: Emergency Medicine

## 2018-06-21 ENCOUNTER — Observation Stay (HOSPITAL_COMMUNITY)
Admission: EM | Admit: 2018-06-21 | Discharge: 2018-06-23 | Disposition: A | Discharge status: Home / Self Care | Payer: Medicare Other | Attending: Internal Medicine | Admitting: Internal Medicine

## 2018-06-21 ENCOUNTER — Other Ambulatory Visit: Payer: Self-pay

## 2018-06-21 ENCOUNTER — Emergency Department (HOSPITAL_COMMUNITY): Payer: Medicare Other

## 2018-06-21 DIAGNOSIS — E785 Hyperlipidemia, unspecified: Secondary | ICD-10-CM | POA: Diagnosis present

## 2018-06-21 DIAGNOSIS — F17210 Nicotine dependence, cigarettes, uncomplicated: Secondary | ICD-10-CM | POA: Insufficient documentation

## 2018-06-21 DIAGNOSIS — Z7951 Long term (current) use of inhaled steroids: Secondary | ICD-10-CM | POA: Diagnosis not present

## 2018-06-21 DIAGNOSIS — M1990 Unspecified osteoarthritis, unspecified site: Secondary | ICD-10-CM | POA: Diagnosis not present

## 2018-06-21 DIAGNOSIS — G8929 Other chronic pain: Secondary | ICD-10-CM | POA: Insufficient documentation

## 2018-06-21 DIAGNOSIS — Z79899 Other long term (current) drug therapy: Secondary | ICD-10-CM | POA: Insufficient documentation

## 2018-06-21 DIAGNOSIS — R509 Fever, unspecified: Principal | ICD-10-CM | POA: Insufficient documentation

## 2018-06-21 DIAGNOSIS — E876 Hypokalemia: Secondary | ICD-10-CM | POA: Insufficient documentation

## 2018-06-21 DIAGNOSIS — R000 Tachycardia, unspecified: Secondary | ICD-10-CM | POA: Diagnosis not present

## 2018-06-21 DIAGNOSIS — F419 Anxiety disorder, unspecified: Secondary | ICD-10-CM | POA: Insufficient documentation

## 2018-06-21 DIAGNOSIS — F1210 Cannabis abuse, uncomplicated: Secondary | ICD-10-CM | POA: Insufficient documentation

## 2018-06-21 DIAGNOSIS — M869 Osteomyelitis, unspecified: Secondary | ICD-10-CM | POA: Insufficient documentation

## 2018-06-21 DIAGNOSIS — E7800 Pure hypercholesterolemia, unspecified: Secondary | ICD-10-CM | POA: Diagnosis not present

## 2018-06-21 DIAGNOSIS — Z8249 Family history of ischemic heart disease and other diseases of the circulatory system: Secondary | ICD-10-CM | POA: Insufficient documentation

## 2018-06-21 DIAGNOSIS — D649 Anemia, unspecified: Secondary | ICD-10-CM | POA: Diagnosis not present

## 2018-06-21 DIAGNOSIS — R197 Diarrhea, unspecified: Secondary | ICD-10-CM | POA: Diagnosis present

## 2018-06-21 DIAGNOSIS — F329 Major depressive disorder, single episode, unspecified: Secondary | ICD-10-CM | POA: Insufficient documentation

## 2018-06-21 DIAGNOSIS — F1910 Other psychoactive substance abuse, uncomplicated: Secondary | ICD-10-CM

## 2018-06-21 DIAGNOSIS — M48061 Spinal stenosis, lumbar region without neurogenic claudication: Secondary | ICD-10-CM | POA: Diagnosis not present

## 2018-06-21 DIAGNOSIS — I10 Essential (primary) hypertension: Secondary | ICD-10-CM | POA: Insufficient documentation

## 2018-06-21 DIAGNOSIS — R112 Nausea with vomiting, unspecified: Secondary | ICD-10-CM

## 2018-06-21 DIAGNOSIS — K219 Gastro-esophageal reflux disease without esophagitis: Secondary | ICD-10-CM | POA: Diagnosis not present

## 2018-06-21 DIAGNOSIS — M4646 Discitis, unspecified, lumbar region: Secondary | ICD-10-CM | POA: Diagnosis present

## 2018-06-21 MED ORDER — SODIUM CHLORIDE 0.9% FLUSH
3.0000 mL | Freq: Once | INTRAVENOUS | Status: AC
  Administered 2018-06-22: 3 mL via INTRAVENOUS

## 2018-06-21 NOTE — ED Triage Notes (Signed)
Pt presents with new onset fever today, reports taking excedrin at 2130 tonight. Currently on IV rocephin at home for osteomyelitis and a spinal abscess.

## 2018-06-22 ENCOUNTER — Encounter (HOSPITAL_COMMUNITY): Payer: Self-pay | Admitting: Emergency Medicine

## 2018-06-22 ENCOUNTER — Emergency Department (HOSPITAL_COMMUNITY): Payer: Medicare Other

## 2018-06-22 DIAGNOSIS — R112 Nausea with vomiting, unspecified: Secondary | ICD-10-CM

## 2018-06-22 DIAGNOSIS — F1910 Other psychoactive substance abuse, uncomplicated: Secondary | ICD-10-CM

## 2018-06-22 DIAGNOSIS — D649 Anemia, unspecified: Secondary | ICD-10-CM

## 2018-06-22 DIAGNOSIS — R509 Fever, unspecified: Secondary | ICD-10-CM

## 2018-06-22 DIAGNOSIS — E785 Hyperlipidemia, unspecified: Secondary | ICD-10-CM

## 2018-06-22 DIAGNOSIS — R197 Diarrhea, unspecified: Secondary | ICD-10-CM

## 2018-06-22 DIAGNOSIS — E876 Hypokalemia: Secondary | ICD-10-CM

## 2018-06-22 LAB — CBC WITH DIFFERENTIAL/PLATELET
Abs Immature Granulocytes: 0.03 10*3/uL (ref 0.00–0.07)
Basophils Absolute: 0 10*3/uL (ref 0.0–0.1)
Basophils Relative: 0 %
EOS ABS: 0 10*3/uL (ref 0.0–0.5)
Eosinophils Relative: 0 %
HCT: 33.2 % — ABNORMAL LOW (ref 36.0–46.0)
Hemoglobin: 10.7 g/dL — ABNORMAL LOW (ref 12.0–15.0)
Immature Granulocytes: 0 %
Lymphocytes Relative: 9 %
Lymphs Abs: 0.8 10*3/uL (ref 0.7–4.0)
MCH: 27.6 pg (ref 26.0–34.0)
MCHC: 32.2 g/dL (ref 30.0–36.0)
MCV: 85.6 fL (ref 80.0–100.0)
Monocytes Absolute: 0.8 10*3/uL (ref 0.1–1.0)
Monocytes Relative: 9 %
Neutro Abs: 6.9 10*3/uL (ref 1.7–7.7)
Neutrophils Relative %: 82 %
Platelets: 174 10*3/uL (ref 150–400)
RBC: 3.88 MIL/uL (ref 3.87–5.11)
RDW: 14.9 % (ref 11.5–15.5)
WBC: 8.5 10*3/uL (ref 4.0–10.5)
nRBC: 0 % (ref 0.0–0.2)

## 2018-06-22 LAB — COMPREHENSIVE METABOLIC PANEL
ALK PHOS: 77 U/L (ref 38–126)
ALT: 17 U/L (ref 0–44)
AST: 32 U/L (ref 15–41)
Albumin: 4.1 g/dL (ref 3.5–5.0)
Anion gap: 10 (ref 5–15)
BUN: 6 mg/dL (ref 6–20)
CO2: 22 mmol/L (ref 22–32)
Calcium: 8.8 mg/dL — ABNORMAL LOW (ref 8.9–10.3)
Chloride: 102 mmol/L (ref 98–111)
Creatinine, Ser: 0.65 mg/dL (ref 0.44–1.00)
GFR calc Af Amer: >60 mL/min (ref >60)
GFR calc non Af Amer: >60 mL/min (ref >60)
Glucose, Bld: 101 mg/dL — ABNORMAL HIGH (ref 70–99)
Potassium: 3.1 mmol/L — ABNORMAL LOW (ref 3.5–5.1)
Sodium: 134 mmol/L — ABNORMAL LOW (ref 135–145)
Total Bilirubin: 0.3 mg/dL (ref 0.3–1.2)
Total Protein: 7.6 g/dL (ref 6.5–8.1)

## 2018-06-22 LAB — URINALYSIS, ROUTINE W REFLEX MICROSCOPIC
Bacteria, UA: NONE SEEN
Bilirubin Urine: NEGATIVE
Glucose, UA: NEGATIVE mg/dL
Ketones, ur: 5 mg/dL — AB
Leukocytes,Ua: NEGATIVE
Nitrite: NEGATIVE
Protein, ur: NEGATIVE mg/dL
Specific Gravity, Urine: 1.01 (ref 1.005–1.030)
pH: 6 (ref 5.0–8.0)

## 2018-06-22 LAB — RAPID URINE DRUG SCREEN, HOSP PERFORMED
Amphetamines: NOT DETECTED
Barbiturates: NOT DETECTED
Benzodiazepines: NOT DETECTED
Cocaine: POSITIVE — AB
OPIATES: NOT DETECTED
TETRAHYDROCANNABINOL: POSITIVE — AB

## 2018-06-22 LAB — C DIFFICILE QUICK SCREEN W PCR REFLEX
C Diff antigen: NEGATIVE
C Diff interpretation: NOT DETECTED
C Diff toxin: NEGATIVE

## 2018-06-22 LAB — I-STAT BETA HCG BLOOD, ED (MC, WL, AP ONLY)

## 2018-06-22 LAB — INFLUENZA PANEL BY PCR (TYPE A & B)
INFLAPCR: NEGATIVE
Influenza B By PCR: NEGATIVE

## 2018-06-22 LAB — LACTIC ACID, PLASMA: Lactic Acid, Venous: 0.8 mmol/L (ref 0.5–1.9)

## 2018-06-22 LAB — MAGNESIUM: Magnesium: 1.7 mg/dL (ref 1.7–2.4)

## 2018-06-22 MED ORDER — ONDANSETRON HCL 4 MG/2ML IJ SOLN: 4.0000 mg | Freq: Four times a day (QID) | INTRAMUSCULAR | Status: DC | PRN

## 2018-06-22 MED ORDER — PIPERACILLIN-TAZOBACTAM 3.375 G IVPB
3.3750 g | Freq: Three times a day (TID) | INTRAVENOUS | Status: DC
  Administered 2018-06-22 (×2): 3.375 g via INTRAVENOUS
  Filled 2018-06-22 (×4): qty 50

## 2018-06-22 MED ORDER — QUETIAPINE FUMARATE 50 MG PO TABS
300.0000 mg | ORAL_TABLET | Freq: Every day | ORAL | Status: DC
  Administered 2018-06-22: 300 mg via ORAL
  Filled 2018-06-22: qty 6

## 2018-06-22 MED ORDER — FLUOXETINE HCL 20 MG PO CAPS
60.0000 mg | ORAL_CAPSULE | Freq: Every day | ORAL | Status: DC
  Administered 2018-06-22 – 2018-06-23 (×2): 60 mg via ORAL
  Filled 2018-06-22 (×2): qty 3

## 2018-06-22 MED ORDER — RISAQUAD PO CAPS
1.0000 | ORAL_CAPSULE | Freq: Two times a day (BID) | ORAL | Status: DC
  Administered 2018-06-22 – 2018-06-23 (×3): 1 via ORAL
  Filled 2018-06-22 (×3): qty 1

## 2018-06-22 MED ORDER — VITAMIN D 25 MCG (1000 UNIT) PO TABS
1000.0000 [IU] | ORAL_TABLET | Freq: Every day | ORAL | Status: DC
  Administered 2018-06-22 – 2018-06-23 (×2): 1000 [IU] via ORAL
  Filled 2018-06-22 (×2): qty 1

## 2018-06-22 MED ORDER — VANCOMYCIN HCL IN DEXTROSE 750-5 MG/150ML-% IV SOLN
750.0000 mg | Freq: Two times a day (BID) | INTRAVENOUS | Status: DC
  Administered 2018-06-22 – 2018-06-23 (×2): 750 mg via INTRAVENOUS
  Filled 2018-06-22 (×2): qty 150

## 2018-06-22 MED ORDER — POTASSIUM CHLORIDE CRYS ER 20 MEQ PO TBCR
40.0000 meq | EXTENDED_RELEASE_TABLET | Freq: Once | ORAL | Status: AC
  Administered 2018-06-22: 40 meq via ORAL
  Filled 2018-06-22: qty 2

## 2018-06-22 MED ORDER — OXYCODONE HCL 5 MG PO TABS
5.0000 mg | ORAL_TABLET | ORAL | Status: DC | PRN
  Administered 2018-06-22 – 2018-06-23 (×5): 5 mg via ORAL
  Filled 2018-06-22 (×6): qty 1

## 2018-06-22 MED ORDER — ONDANSETRON HCL 4 MG/2ML IJ SOLN
4.0000 mg | Freq: Once | INTRAMUSCULAR | Status: AC
  Administered 2018-06-22: 4 mg via INTRAVENOUS
  Filled 2018-06-22: qty 2

## 2018-06-22 MED ORDER — SODIUM CHLORIDE 0.9 % IV SOLN
INTRAVENOUS | Status: AC
  Administered 2018-06-22: 07:00:00 via INTRAVENOUS

## 2018-06-22 MED ORDER — GABAPENTIN 300 MG PO CAPS
600.0000 mg | ORAL_CAPSULE | Freq: Three times a day (TID) | ORAL | Status: DC
  Administered 2018-06-22 – 2018-06-23 (×4): 600 mg via ORAL
  Filled 2018-06-22 (×4): qty 2

## 2018-06-22 MED ORDER — PIPERACILLIN-TAZOBACTAM 3.375 G IVPB 30 MIN
3.3750 g | Freq: Once | INTRAVENOUS | Status: AC
  Administered 2018-06-22: 3.375 g via INTRAVENOUS
  Filled 2018-06-22: qty 50

## 2018-06-22 MED ORDER — ACETAMINOPHEN 650 MG RE SUPP: 650.0000 mg | Freq: Four times a day (QID) | RECTAL | Status: DC | PRN

## 2018-06-22 MED ORDER — VANCOMYCIN HCL IN DEXTROSE 1-5 GM/200ML-% IV SOLN
1000.0000 mg | Freq: Once | INTRAVENOUS | Status: AC
  Administered 2018-06-22: 1000 mg via INTRAVENOUS
  Filled 2018-06-22: qty 200

## 2018-06-22 MED ORDER — VANCOMYCIN HCL 10 G IV SOLR
1500.0000 mg | Freq: Once | INTRAVENOUS | Status: AC
  Administered 2018-06-22: 1500 mg via INTRAVENOUS
  Filled 2018-06-22: qty 1500

## 2018-06-22 MED ORDER — ENOXAPARIN SODIUM 40 MG/0.4ML ~~LOC~~ SOLN
40.0000 mg | Freq: Every day | SUBCUTANEOUS | Status: DC
  Administered 2018-06-22 – 2018-06-23 (×2): 40 mg via SUBCUTANEOUS
  Filled 2018-06-22 (×2): qty 0.4

## 2018-06-22 MED ORDER — ATORVASTATIN CALCIUM 10 MG PO TABS
20.0000 mg | ORAL_TABLET | Freq: Every day | ORAL | Status: DC
  Administered 2018-06-22: 20 mg via ORAL
  Filled 2018-06-22: qty 2

## 2018-06-22 MED ORDER — ADULT MULTIVITAMIN W/MINERALS CH
1.0000 | ORAL_TABLET | Freq: Every day | ORAL | Status: DC
  Administered 2018-06-22 – 2018-06-23 (×2): 1 via ORAL
  Filled 2018-06-22 (×2): qty 1

## 2018-06-22 MED ORDER — MAGNESIUM SULFATE 2 GM/50ML IV SOLN
2.0000 g | Freq: Once | INTRAVENOUS | Status: AC
  Administered 2018-06-22: 2 g via INTRAVENOUS
  Filled 2018-06-22: qty 50

## 2018-06-22 MED ORDER — METHOCARBAMOL 500 MG PO TABS
500.0000 mg | ORAL_TABLET | Freq: Four times a day (QID) | ORAL | Status: DC | PRN
  Administered 2018-06-22 – 2018-06-23 (×3): 500 mg via ORAL
  Filled 2018-06-22 (×4): qty 1

## 2018-06-22 MED ORDER — ACETAMINOPHEN 325 MG PO TABS: 650.0000 mg | ORAL_TABLET | Freq: Four times a day (QID) | ORAL | Status: DC | PRN

## 2018-06-22 MED ORDER — MELATONIN 3 MG PO TABS
3.0000 mg | ORAL_TABLET | Freq: Every day | ORAL | Status: DC
  Administered 2018-06-22: 3 mg via ORAL
  Filled 2018-06-22: qty 1

## 2018-06-22 MED ORDER — POTASSIUM CHLORIDE CRYS ER 20 MEQ PO TBCR: 40.0000 meq | EXTENDED_RELEASE_TABLET | Freq: Once | ORAL | Status: DC

## 2018-06-22 MED ORDER — SODIUM CHLORIDE 0.9 % IV BOLUS
1000.0000 mL | Freq: Once | INTRAVENOUS | Status: AC
  Administered 2018-06-22: 1000 mL via INTRAVENOUS

## 2018-06-22 NOTE — Progress Notes (Signed)
Stool sample negative at this time. Isolation d/c'ed per protocol.  Sim Boast, RN

## 2018-06-22 NOTE — ED Notes (Signed)
Attempted report to 5C. 

## 2018-06-22 NOTE — Progress Notes (Signed)
Patient admitted after midnight with fever. 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 No uti, chest xray clear, PICC line without signs infection. Mri lumbar spine unchanged, influenza panel negative. ID to see  PE Gen: sitting on side of bed in no acute distress CV: RRR no mgr no LE edema Resp: normal effort BS clear bilaterally no wheeze Abd: soft +BS no guarding or rebounding   A/P 1. Fever. Max temp 100.3. non-toxic appearing -follow blood cultures -continue vancomycin and zosyn per pharm -supportive therapy -monitor  2. Vomiting and diarrhea. Nausea resolved this am after zofran. Pt reports "soft stool" since rocephin at home. Denies large volume stool. -check cdiff -zofran as needed  3. Hypokalemia.  -replete and recheck -magnesium IV given for 1.7 level -recheck in am  4. Chronic anemia: stable  5. Hx substance abuse; UDS + cociaine, benzo and marijuanna  Karen black, np

## 2018-06-22 NOTE — ED Notes (Signed)
Patient transported to MRI 

## 2018-06-22 NOTE — H&P (Signed)
History and Physical    Mary Ewing WCB:[id number] DOB: 02-19-1971 DOA: 06/21/2018  PCP: Katherina Mires, MD Patient coming from: Home  Chief Complaint: Fever  HPI: Mary Ewing is a 48 y.o. female with medical history significant of asthma, anxiety, depression, GERD, hyperlipidemia, anemia, substance abuse, currently on IV Rocephin at home for osteomyelitis and spinal abscess presenting to the hospital for evaluation of fever.  Patient reports having a fever of 102.7 F yesterday.  She has been vomiting and not been able to eat.  Having loose stools and diarrhea for the past 1 week.  Denies having any abdominal pain.  States infectious disease stopped Flagyl yesterday as she was having diarrhea.  Reports having worsening lower back pain and swelling.  Denies having any body aches, rhinorrhea, or sore throat.  Denies having any chest pain or shortness of breath.  Patient with a history of lumbar osteomyelitis.  Admitted 1/30 to 2/5 for culture-negative discitis, L3-L4, dental caries.  Source of discitis was suspected to be her teeth.  ID had started her on ceftriaxone and Flagyl for 6 weeks starting 2/4.   Review of Systems: As per HPI otherwise 10 point review of systems negative.  Past Medical History:  Diagnosis Date  . Acute renal failure ([place])   . Anxiety   . Asthmatic bronchitis    history of  . Depression   . DJD (degenerative joint disease)   . GERD (gastroesophageal reflux disease)   . H/O: suicide attempt    x 3  . High cholesterol   . Iron deficiency anemia   . Substance abuse [facility])     Past Surgical History:  Procedure Laterality Date  . APPENDECTOMY  2004  . CESAREAN SECTION    . MOUTH SURGERY     teeth removed  . REFRACTIVE SURGERY    . right tympanoplasty  1999  . TONSILLECTOMY       reports that she has been smoking cigarettes. She has a 24.00 pack-year smoking history. She has never used smokeless tobacco. She reports current alcohol use. She reports  current drug use. Drugs: Cocaine and Marijuana.  Allergies  Allergen Reactions  . Sulfonamide Derivatives Hives    Family History  Problem Relation Age of Onset  . Asthma Mother   . COPD Mother   . Anxiety disorder Mother   . Diabetes Mother   . Depression Mother   . Hypertension Mother     Prior to Admission medications   Medication Sig Start Date End Date Taking? Authorizing Provider  acetaminophen (TYLENOL) 325 MG tablet Take 2 tablets (650 mg total) by mouth every 6 (six) hours as needed for mild pain (or Fever >/= 101). 05/29/18   Debbe Odea, MD  aspirin-acetaminophen-caffeine (EXCEDRIN MIGRAINE) [phone] MG tablet Take 1 tablet by mouth every 8 (eight) hours as needed for headache.     [provider]  atorvastatin (LIPITOR) 20 MG tablet Take 20 mg by mouth daily at 6 PM.  11/11/14 06/06/18  [provider]  baclofen (LIORESAL) 10 MG tablet Take 0.5 tablets (5 mg total) by mouth 2 (two) times daily. 05/29/18 05/29/19  Debbe Odea, MD  cefTRIAXone (ROCEPHIN) IVPB Inject 2 g into the vein daily. Indication:  Culture negative discitis Last Day of Therapy:  07/08/2018 Labs - Once weekly:  CBC/D and BMP, Labs - Every other week:  ESR and CRP 05/29/18 07/09/18  Debbe Odea, MD  dimenhyDRINATE (DRAMAMINE) 50 MG tablet Take 50 mg by mouth every 6 (  six) hours as needed for nausea.    [provider]  docusate sodium (COLACE) 100 MG capsule Take 1 capsule (100 mg total) by mouth 2 (two) times daily. 05/29/18   Debbe Odea, MD  FLUoxetine (PROZAC) 20 MG tablet Take 60 mg by mouth every morning.     [provider]  fluticasone (FLONASE) 50 MCG/ACT nasal spray Place 2 sprays into both nostrils daily. 03/19/18   [provider]  gabapentin (NEURONTIN) 300 MG capsule Take 600 mg by mouth 3 (three) times daily.    [provider]  Melatonin 10 MG TABS Take 10 mg by mouth at bedtime.    [provider]  metroNIDAZOLE (FLAGYL) 500 MG  tablet Take 1 tablet (500 mg total) by mouth 3 (three) times daily. 05/29/18 07/09/18  Debbe Odea, MD  Multiple Vitamins-Minerals (THERA-M) TABS Take 1 tablet by mouth daily.     [provider]  naproxen sodium (ALEVE) 220 MG tablet Take 220 mg by mouth 2 (two) times daily as needed (pain).    [provider]  nicotine (NICODERM CQ - DOSED IN MG/24 HOURS) 14 mg/24hr patch Place 1 patch (14 mg total) onto the skin daily. 05/30/18   Debbe Odea, MD  oxyCODONE (OXY IR/ROXICODONE) 5 MG immediate release tablet Take 1 tablet (5 mg total) by mouth every 4 (four) hours as needed for severe pain or breakthrough pain. 05/29/18   Debbe Odea, MD  QUEtiapine (SEROQUEL) 300 MG tablet Take 300 mg by mouth at bedtime.  04/23/18   [provider]  traMADol (ULTRAM) 50 MG tablet Take 1 tablet (50 mg total) by mouth every 6 (six) hours as needed for moderate pain. 05/29/18   Debbe Odea, MD    Physical Exam: Vitals:   06/22/18 0101 06/22/18 0320 06/22/18 0522 06/22/18 0654  BP: 122/77 (!) 111/51 121/64 (!) 115/59  Pulse: 89 89 87 79  Resp: '18 18 18 16  ' Temp: 98.5 F (36.9 C) 99.3 F (37.4 C) 99.2 F (37.3 C) 98.6 F (37 C)  TempSrc: Oral Oral Oral Oral  SpO2: 98% 100% 100% 99%  Weight:      Height:        Physical Exam  Constitutional: She is oriented to person, place, and time.  Resting comfortably in a hospital stretcher  HENT:  Head: Normocephalic.  Mouth/Throat: Oropharynx is clear and moist.  Eyes: Right eye exhibits no discharge. Left eye exhibits no discharge.  Neck: Neck supple.  Cardiovascular: Normal rate, regular rhythm and intact distal pulses.  Pulmonary/Chest: Effort normal and breath sounds normal. No respiratory distress. She has no wheezes. She has no rales.  Abdominal: Soft. Bowel sounds are normal. She exhibits no distension. There is no abdominal tenderness. There is no rebound and no guarding.  Musculoskeletal:        General: No edema.      Comments: Lumbar spine mildly tender to palpation.  No erythema or induration noted.  Neurological: She is alert and oriented to person, place, and time.  Strength 5 out of 5 in bilateral lower extremities and sensation to light touch intact.  Skin: Skin is warm and dry.  PICC line without erythema or signs of infection     Labs on Admission: I have personally reviewed following labs and imaging studies  CBC: Recent Labs  Lab 06/21/18 2344  WBC 8.5  NEUTROABS 6.9  HGB 10.7*  HCT 33.2*  MCV 85.6  PLT 377   Basic Metabolic Panel: Recent Labs  Lab 06/21/18 2344 06/22/18 0550  NA 134*  --   K 3.1*  --   CL 102  --   CO2 22  --   GLUCOSE 101*  --   BUN 6  --   CREATININE 0.65  --   CALCIUM 8.8*  --   MG  --  1.7   GFR: Estimated Creatinine Clearance: 86 mL/min (by C-G formula based on SCr of 0.65 mg/dL). Liver Function Tests: Recent Labs  Lab 06/21/18 2344  AST 32  ALT 17  ALKPHOS 77  BILITOT 0.3  PROT 7.6  ALBUMIN 4.1   No results for input(s): LIPASE, AMYLASE in the last 168 hours. No results for input(s): AMMONIA in the last 168 hours. Coagulation Profile: No results for input(s): INR, PROTIME in the last 168 hours. Cardiac Enzymes: No results for input(s): CKTOTAL, CKMB, CKMBINDEX, TROPONINI in the last 168 hours. BNP (last 3 results) No results for input(s): PROBNP in the last 8760 hours. HbA1C: No results for input(s): HGBA1C in the last 72 hours. CBG: No results for input(s): GLUCAP in the last 168 hours. Lipid Profile: No results for input(s): CHOL, HDL, LDLCALC, TRIG, CHOLHDL, LDLDIRECT in the last 72 hours. Thyroid Function Tests: No results for input(s): TSH, T4TOTAL, FREET4, T3FREE, THYROIDAB in the last 72 hours. Anemia Panel: No results for input(s): VITAMINB12, FOLATE, FERRITIN, TIBC, IRON, RETICCTPCT in the last 72 hours. Urine analysis:    Component Value Date/Time   COLORURINE YELLOW 06/21/2018 2334   APPEARANCEUR CLEAR 06/21/2018  2334   LABSPEC 1.010 06/21/2018 2334   PHURINE 6.0 06/21/2018 2334   GLUCOSEU NEGATIVE 06/21/2018 2334   HGBUR SMALL (A) 06/21/2018 2334   BILIRUBINUR NEGATIVE 06/21/2018 2334   KETONESUR 5 (A) 06/21/2018 2334   PROTEINUR NEGATIVE 06/21/2018 2334   UROBILINOGEN 0.2 04/09/2014 1314   NITRITE NEGATIVE 06/21/2018 2334   LEUKOCYTESUR NEGATIVE 06/21/2018 2334    Radiological Exams on Admission: Dg Chest 2 View  Result Date: 06/22/2018 CLINICAL DATA:  New onset fever today. Currently on IV antibiotics for osteomyelitis and spinal abscess. EXAM: CHEST - 2 VIEW COMPARISON:  06/06/2018 FINDINGS: Right PICC line with tip over the low SVC region. No pneumothorax. Heart size and pulmonary vascularity are normal. Lungs are clear. No blunting of costophrenic angles. No pneumothorax. Mediastinal contours appear intact. IMPRESSION: No active cardiopulmonary disease. Electronically Signed   By: Lucienne Capers M.D.   On: 06/22/2018 00:01   Mr Lumbar Spine Wo Contrast  Result Date: 06/22/2018 CLINICAL DATA:  Initial evaluation for recurrent fever. EXAM: MRI LUMBAR SPINE WITHOUT CONTRAST TECHNIQUE: Multiplanar, multisequence MR imaging of the lumbar spine was performed. No intravenous contrast was administered. COMPARISON:  Prior MRI from 05/27/2018 FINDINGS: Segmentation: Examination degraded by motion artifact. Transitional lumbosacral anatomy with sacralization of the L5 vertebral body. Same numbering system is employed as on previous exam. Alignment: Trace retrolisthesis of L3 on L4, stable. Alignment otherwise normal with preservation of the normal lumbar lordosis. Vertebrae: Vertebral body height maintained without evidence for acute or interval fracture. No evidence for acute discitis. Bone marrow signal intensity diffusely decreased on T1 weighted imaging, most commonly related to anemia, smoking, or obesity. No discrete or worrisome osseous lesions. Inflammatory changes centered about the right L3-4 facet  with associated reactive marrow edema seen, similar to previous. Conus medullaris and cauda equina: Conus extends to the T12-L1 level. Conus and cauda equina appear normal. Paraspinal and other soft tissues: Paraspinous edema adjacent to the left L3-4 facet. T2 hyperintense collection within  the right posterior paraspinous musculature again seen, measuring approximately 1.2 x 0.7 x 3.0 cm on today's exam, similar. Again, this is seen communicating with the underlying right L3-4 facet, and courses posteriorly and superiorly. Paraspinous soft tissues demonstrate no other acute finding. Visualized visceral structures within normal limits. Disc levels: L1-2:  Unremarkable. L2-3:  Unremarkable. L3-4: Trace retrolisthesis. Mild diffuse disc bulge with disc desiccation and intervertebral disc space narrowing. Persistent marrow edema about the right L3-4 facet, with small bilateral joint effusions, right greater than left. Abnormal T1 hypointensity along the right dorsal lateral epidural space, similar to previous. Previously noted small epidural collection at the dorsal epidural space not as well seen on today's motion degraded exam. Similar mild right lateral recess stenosis. Mild to moderate right L3 foraminal narrowing appears mildly worsened. Central canal remains widely patent. No significant left foraminal narrowing. L4-5:  Unremarkable. L5-S1:  Unremarkable. IMPRESSION: 1. Persistent inflammatory changes surrounding the right L3-4 facet with infiltration of the adjacent epidural fat. Relatively similar 3.0 cm fluid collection within the posterior right paraspinous soft tissues. Previously seen 1 cm fluid collection at the dorsal epidural space not as well seen on today's motion degraded exam. 2. Similar mild right lateral recess stenosis at L3-4, with mildly worsened mild to moderate right L3 foraminal narrowing. Electronically Signed   By: Jeannine Boga M.D.   On: 06/22/2018 06:31    EKG: Independently  reviewed.  Sinus tachycardia (heart rate 101).  Assessment/Plan Principal Problem:   Fever Active Problems:   HLD (hyperlipidemia)   Hypokalemia   Anemia   Substance abuse ([facility])   Fever -Febrile with temperature 100.3 F.  Hemodynamically stable.  No leukocytosis and lactic acid normal.  Chest x-ray negative for acute finding.  UA not suggestive of infection.  PICC line without erythema or signs of infection. -Recently admitted for culture-negative discitis, L3-L4, thought to be secondary to dental caries.  Discharged on ceftriaxone and Flagyl.   -Vancomycin and Zosyn -MRI of lumbar spine -Blood culture x2 -Influenza panel pending -ED physician discussed the case with infectious disease.  Agree with testing for influenza and MRI.  ID will see the patient in the morning; appreciate recommendations.  Vomiting and diarrhea -Symptoms could be due to influenza infection in the setting of fever.  Diarrhea could also be explained by IV antibiotic use at home.  Abdominal exam benign.  LFTs normal. -C. difficile PCR -IV fluid hydration -Zofran PRN -Testing for influenza as mentioned above  Hypokalemia -Potassium 3.1. -Replete potassium -Check magnesium level -Continue to monitor BMP  Chronic anemia -Hemoglobin 10.7, recent baseline 11.1-11.7.  No signs of active bleeding.  History of substance abuse -Check UDS  Hyperlipidemia -Continue Lipitor  Depression -Continue Prozac  Chronic pain -Continue home OxyIR, gabapentin  DVT prophylaxis: Lovenox Code Status: Full code Family Communication: No family available. Disposition Plan: Anticipate discharge after clinical improvement. Consults called: ID (Dr. Graylon Good) Admission status: Observation, telemetry  Shela Leff MD [facility] Pager [phone]  If 7PM-7AM, please contact night-coverage [email or web address] Password [email or web address]  06/22/2018, 7:01 AM

## 2018-06-22 NOTE — ED Provider Notes (Signed)
[place] EMERGENCY DEPARTMENT Provider Note   CSN: [id number] Arrival date & time: 06/21/18  2300    History   Chief Complaint Chief Complaint  Patient presents with  . Fever    HPI Mary Ewing is a 48 y.o. female.     The history is provided by the patient.  Fever  Max temp prior to arrival:  102.7 Temp source:  Oral Severity:  Moderate Onset quality:  Gradual Duration:  1 day Timing:  Intermittent Progression:  Unchanged Chronicity:  New Relieved by:  Nothing Worsened by:  Nothing Ineffective treatments:  None tried Associated symptoms: diarrhea, nausea and vomiting   Associated symptoms: no chest pain, no chills, no confusion, no congestion, no cough, no dysuria, no ear pain, no headaches, no myalgias, no rash, no rhinorrhea, no somnolence and no sore throat   Risk factors: no contaminated food     Past Medical History:  Diagnosis Date  . Acute renal failure ([place])   . Anxiety   . Asthmatic bronchitis    history of  . Depression   . DJD (degenerative joint disease)   . GERD (gastroesophageal reflux disease)   . H/O: suicide attempt    x 3  . High cholesterol   . Iron deficiency anemia   . Substance abuse [facility])     Patient Active Problem List   Diagnosis Date Noted  . Discitis of lumbar region   . Facet joint disease of lumbosacral region 05/23/2018  . Essential hypertension 05/23/2018  . Marijuana abuse 05/23/2018  . Dental abscess 05/23/2018  . Chronic low back pain 03/30/2015  . Neck pain 02/04/2015  . Arthralgia of hip 01/26/2015  . Elevated WBC count 11/11/2014  . 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 HLD (hyperlipidemia) 11/10/2014  . Menopause praecox 10/23/2014  . Opioid dependence in remission ([place]) 10/23/2014  . Recurrent major depressive disorder ([place]) 10/23/2014  . Pain in shoulder 10/23/2014  . Chronic cervical pain 10/23/2014  . Overdose 04/09/2014  . Elevated transaminase level 04/09/2014  . Rhabdomyolysis  04/09/2014  . PNA (pneumonia) 04/06/2011  . NECK PAIN, ACUTE 06/10/2009  . TOBACCO ABUSE 05/20/2009  . Nausea alone 05/19/2009  . ABDOMINAL PAIN RIGHT LOWER QUADRANT 05/19/2009  . ABDOMINAL PAIN, HX OF 05/18/2009  . Extrinsic asthma 09/30/2008  . BACK PAIN 05/28/2008  . ABDOMINAL PAIN, LEFT UPPER QUADRANT 05/28/2008  . Depression with anxiety 03/24/2008  . SHOULDER PAIN, LEFT 03/24/2008  . HEADACHE 03/24/2008    Past Surgical History:  Procedure Laterality Date  . APPENDECTOMY  2004  . CESAREAN SECTION    . MOUTH SURGERY     teeth removed  . REFRACTIVE SURGERY    . right tympanoplasty  1999  . TONSILLECTOMY       OB History   No obstetric history on file.      Home Medications    Prior to Admission medications   Medication Sig Start Date End Date Taking? Authorizing Provider  acetaminophen (TYLENOL) 325 MG tablet Take 2 tablets (650 mg total) by mouth every 6 (six) hours as needed for mild pain (or Fever >/= 101). 05/29/18   Debbe Odea, MD  aspirin-acetaminophen-caffeine (EXCEDRIN MIGRAINE) [phone] MG tablet Take 1 tablet by mouth every 8 (eight) hours as needed for headache.     [provider]  atorvastatin (LIPITOR) 20 MG tablet Take 20 mg by mouth daily at 6 PM.  11/11/14 06/06/18  [provider]  baclofen (LIORESAL) 10 MG tablet Take 0.5 tablets (5  mg total) by mouth 2 (two) times daily. 05/29/18 05/29/19  Debbe Odea, MD  cefTRIAXone (ROCEPHIN) IVPB Inject 2 g into the vein daily. Indication:  Culture negative discitis Last Day of Therapy:  07/08/2018 Labs - Once weekly:  CBC/D and BMP, Labs - Every other week:  ESR and CRP 05/29/18 07/09/18  Debbe Odea, MD  dimenhyDRINATE (DRAMAMINE) 50 MG tablet Take 50 mg by mouth every 6 (six) hours as needed for nausea.    [provider]  docusate sodium (COLACE) 100 MG capsule Take 1 capsule (100 mg total) by mouth 2 (two) times daily. 05/29/18   Debbe Odea, MD  FLUoxetine (PROZAC) 20 MG tablet  Take 60 mg by mouth every morning.     [provider]  fluticasone (FLONASE) 50 MCG/ACT nasal spray Place 2 sprays into both nostrils daily. 03/19/18   [provider]  gabapentin (NEURONTIN) 300 MG capsule Take 600 mg by mouth 3 (three) times daily.    [provider]  Melatonin 10 MG TABS Take 10 mg by mouth at bedtime.    [provider]  metroNIDAZOLE (FLAGYL) 500 MG tablet Take 1 tablet (500 mg total) by mouth 3 (three) times daily. 05/29/18 07/09/18  Debbe Odea, MD  Multiple Vitamins-Minerals (THERA-M) TABS Take 1 tablet by mouth daily.     [provider]  naproxen sodium (ALEVE) 220 MG tablet Take 220 mg by mouth 2 (two) times daily as needed (pain).    [provider]  nicotine (NICODERM CQ - DOSED IN MG/24 HOURS) 14 mg/24hr patch Place 1 patch (14 mg total) onto the skin daily. 05/30/18   Debbe Odea, MD  oxyCODONE (OXY IR/ROXICODONE) 5 MG immediate release tablet Take 1 tablet (5 mg total) by mouth every 4 (four) hours as needed for severe pain or breakthrough pain. 05/29/18   Debbe Odea, MD  QUEtiapine (SEROQUEL) 300 MG tablet Take 300 mg by mouth at bedtime.  04/23/18   [provider]  traMADol (ULTRAM) 50 MG tablet Take 1 tablet (50 mg total) by mouth every 6 (six) hours as needed for moderate pain. 05/29/18   Debbe Odea, MD    Family History Family History  Problem Relation Age of Onset  . Asthma Mother   . COPD Mother   . Anxiety disorder Mother   . Diabetes Mother   . Depression Mother   . Hypertension Mother     Social History Social History   Tobacco Use  . Smoking status: Current Every Day Smoker    Packs/day: 1.00    Years: 24.00    Pack years: 24.00    Types: Cigarettes  . Smokeless tobacco: Never Used  . Tobacco comment: needs a patch  Substance Use Topics  . Alcohol use: Yes    Alcohol/week: 0.0 standard drinks    Comment: occ  . Drug use: Yes    Types: Cocaine, Marijuana    Comment:  prescription drugs;last use of marijuana was this AM, denies cocaine      Allergies   Sulfonamide derivatives   Review of Systems Review of Systems  Constitutional: Positive for fever. Negative for chills.  HENT: Negative for congestion, ear pain, rhinorrhea and sore throat.   Respiratory: Negative for cough.   Cardiovascular: Negative for chest pain.  Gastrointestinal: Positive for diarrhea, nausea and vomiting.  Genitourinary: Negative for dysuria.  Musculoskeletal: Negative for myalgias.  Skin: Negative for rash.  Neurological: Negative for headaches.  Psychiatric/Behavioral: Negative for confusion.  All other systems reviewed  and are negative.    Physical Exam Updated Vital Signs BP 121/64 (BP Location: Left Arm)   Pulse 87   Temp 99.2 F (37.3 C) (Oral)   Resp 18   Ht _0  (1.575 m)   Wt 81.6 kg   LMP 06/05/2018   SpO2 100%   BMI 32.92 kg/m   Physical Exam Vitals signs and nursing note reviewed.  Constitutional:      General: She is not in acute distress.    Appearance: Normal appearance.  HENT:     Head: Normocephalic and atraumatic.     Nose: Nose normal.     Mouth/Throat:     Mouth: Mucous membranes are moist.     Pharynx: Oropharynx is clear.  Eyes:     Conjunctiva/sclera: Conjunctivae normal.     Pupils: Pupils are equal, round, and reactive to light.  Neck:     Musculoskeletal: Normal range of motion and neck supple.  Cardiovascular:     Rate and Rhythm: Normal rate and regular rhythm.     Pulses: Normal pulses.     Heart sounds: Normal heart sounds.  Pulmonary:     Effort: Pulmonary effort is normal.     Breath sounds: Normal breath sounds.  Abdominal:     General: Abdomen is flat. Bowel sounds are normal.     Tenderness: There is no abdominal tenderness. There is no guarding or rebound.  Musculoskeletal: Normal range of motion.     Thoracic back: Normal.     Lumbar back: Normal.  Skin:    General: Skin is warm and dry.     Capillary  Refill: Capillary refill takes less than 2 seconds.  Neurological:     General: No focal deficit present.     Mental Status: She is alert and oriented to person, place, and time.  Psychiatric:        Mood and Affect: Mood normal.        Behavior: Behavior normal.      ED Treatments / Results  Labs (all labs ordered are listed, but only abnormal results are displayed) Results for orders placed or performed during the hospital encounter of 06/21/18  Lactic acid, plasma  Result Value Ref Range   Lactic Acid, Venous 0.8 0.5 - 1.9 mmol/L  Comprehensive metabolic panel  Result Value Ref Range   Sodium 134 (L) 135 - 145 mmol/L   Potassium 3.1 (L) 3.5 - 5.1 mmol/L   Chloride 102 98 - 111 mmol/L   CO2 22 22 - 32 mmol/L   Glucose, Bld 101 (H) 70 - 99 mg/dL   BUN 6 6 - 20 mg/dL   Creatinine, Ser 0.65 0.44 - 1.00 mg/dL   Calcium 8.8 (L) 8.9 - 10.3 mg/dL   Total Protein 7.6 6.5 - 8.1 g/dL   Albumin 4.1 3.5 - 5.0 g/dL   AST 32 15 - 41 U/L   ALT 17 0 - 44 U/L   Alkaline Phosphatase 77 38 - 126 U/L   Total Bilirubin 0.3 0.3 - 1.2 mg/dL   GFR calc non Af Amer >60 >60 mL/min   GFR calc Af Amer >60 >60 mL/min   Anion gap 10 5 - 15  CBC with Differential  Result Value Ref Range   WBC 8.5 4.0 - 10.5 K/uL   RBC 3.88 3.87 - 5.11 MIL/uL   Hemoglobin 10.7 (L) 12.0 - 15.0 g/dL   HCT 33.2 (L) 36.0 - 46.0 %   MCV 85.6 80.0 - 100.0 fL  MCH 27.6 26.0 - 34.0 pg   MCHC 32.2 30.0 - 36.0 g/dL   RDW 14.9 11.5 - 15.5 %   Platelets 174 150 - 400 K/uL   nRBC 0.0 0.0 - 0.2 %   Neutrophils Relative % 82 %   Neutro Abs 6.9 1.7 - 7.7 K/uL   Lymphocytes Relative 9 %   Lymphs Abs 0.8 0.7 - 4.0 K/uL   Monocytes Relative 9 %   Monocytes Absolute 0.8 0.1 - 1.0 K/uL   Eosinophils Relative 0 %   Eosinophils Absolute 0.0 0.0 - 0.5 K/uL   Basophils Relative 0 %   Basophils Absolute 0.0 0.0 - 0.1 K/uL   Immature Granulocytes 0 %   Abs Immature Granulocytes 0.03 0.00 - 0.07 K/uL  Urinalysis, Routine w  reflex microscopic  Result Value Ref Range   Color, Urine YELLOW YELLOW   APPearance CLEAR CLEAR   Specific Gravity, Urine 1.010 1.005 - 1.030   pH 6.0 5.0 - 8.0   Glucose, UA NEGATIVE NEGATIVE mg/dL   Hgb urine dipstick SMALL (A) NEGATIVE   Bilirubin Urine NEGATIVE NEGATIVE   Ketones, ur 5 (A) NEGATIVE mg/dL   Protein, ur NEGATIVE NEGATIVE mg/dL   Nitrite NEGATIVE NEGATIVE   Leukocytes,Ua NEGATIVE NEGATIVE   RBC / HPF 6-10 0 - 5 RBC/hpf   WBC, UA 0-5 0 - 5 WBC/hpf   Bacteria, UA NONE SEEN NONE SEEN   Squamous Epithelial / LPF 0-5 0 - 5   Mucus PRESENT   I-Stat beta hCG blood, ED  Result Value Ref Range   I-stat hCG, quantitative <5.0 <5 mIU/mL   Comment 3           Dg Orthopantogram  Result Date: 05/27/2018 CLINICAL DATA:  Patient complains of 2 abscessed teeth and TMJ issues x couple years now. Patient states she only has about 10 teeth left and that some of them are cracked. Patient states they are trying to rule out infection from spine. Current smoker EXAM: ORTHOPANTOGRAM/PANORAMIC COMPARISON:  Cervical spine radiographs 04/22/2015 FINDINGS: Multiple prior tooth extractions or resorptions. The remaining teeth demonstrate multiple dental caries. Periapical lucencies have multiple teeth suggesting periodontal disease. No acute fracture or dislocation of the mandible. IMPRESSION: Multiple dental caries and periapical lucencies consistent with periodontal disease. Multiple prior tooth extractions or resorption. Electronically Signed   By: Lucienne Capers M.D.   On: 05/27/2018 22:22   Dg Chest 2 View  Result Date: 06/22/2018 CLINICAL DATA:  New onset fever today. Currently on IV antibiotics for osteomyelitis and spinal abscess. EXAM: CHEST - 2 VIEW COMPARISON:  06/06/2018 FINDINGS: Right PICC line with tip over the low SVC region. No pneumothorax. Heart size and pulmonary vascularity are normal. Lungs are clear. No blunting of costophrenic angles. No pneumothorax. Mediastinal contours  appear intact. IMPRESSION: No active cardiopulmonary disease. Electronically Signed   By: Lucienne Capers M.D.   On: 06/22/2018 00:01   Dg Chest 2 View  Result Date: 06/06/2018 CLINICAL DATA:  PICC line position check EXAM: CHEST - 2 VIEW COMPARISON:  None. FINDINGS: Tip of the right-sided PICC line projects over the lower SVC. Lungs are clear. Normal cardiomediastinal contours. IMPRESSION: Right-sided PICC line tip projects over the lower SVC. Electronically Signed   By: Ulyses Jarred M.D.   On: 06/06/2018 01:44   Mr Lumbar Spine Wo Contrast  Result Date: 05/27/2018 CLINICAL DATA:  Possible right L3-L4 septic arthritis status post aspiration. Persistent back pain with bilateral leg numbness and weakness. EXAM: MRI LUMBAR SPINE  WITHOUT CONTRAST TECHNIQUE: Multiplanar, multisequence MR imaging of the lumbar spine was performed. No intravenous contrast was administered. COMPARISON:  MRI lumbar spine dated May 19, 2018. FINDINGS: Segmentation:  Unchanged sacralization of L5. Alignment:  Physiologic. Vertebrae: No fracture, evidence of discitis, or bone lesion. Unchanged right facet and perifacet edema at L3-L4 extending into both right L3 and L4 pedicles. Unchanged small facet joint effusion. Unchanged 1.1 x 0.7 x 3.1 cm fluid collection extending posteriorly and superiorly from facet joint. Unchanged surrounding soft tissue edema. Conus medullaris and cauda equina: Conus extends to the T12-L1 level. Conus and cauda equina appear normal. Paraspinal and other soft tissues: Cholelithiasis. Disc levels: T11-T12: Negative. T12-L1:  Negative. L1-L2:  Negative. L2-L3:  Negative. L3-L4: Unchanged shallow disc bulge small superimposed left subarticular and foraminal disc protrusion. Unchanged abnormal low T1 signal in the epidural fat along the posterior and right lateral thecal sac. 5 x 5 x 10 mm fluid collection in the posterior epidural space is unchanged. Unchanged mild right lateral recess and neuroforaminal  stenosis. No spinal canal or left neuroforaminal stenosis. L4-L5:  Negative. L5-S1:  Negative. IMPRESSION: 1. Stable inflammatory changes surrounding the right L3-L4 facet joint with infiltration/effacement of the epidural fat along the right and posterior aspects of the thecal sac. Unchanged 3.1 cm fluid collection in the posterior right paraspinous soft tissues and 1.0 cm fluid collection in the posterior epidural space. These could represent synovial cysts or abscesses. 2. Unchanged mild right lateral recess and neuroforaminal stenosis at L3-L4. 3. Cholelithiasis. Electronically Signed   By: Titus Dubin M.D.   On: 05/27/2018 13:43   Ct Aspiration  Result Date: 05/24/2018 INDICATION: Suspected septic right L3-4 facet arthritis EXAM: CT-GUIDED ASPIRATION MEDICATIONS: The patient is currently admitted to the hospital and receiving intravenous antibiotics. The antibiotics were administered within an appropriate time frame prior to the initiation of the procedure. ANESTHESIA/SEDATION: Fentanyl 75 mcg IV; Versed 1.5 mg IV Moderate Sedation Time:  10 minutes The patient was continuously monitored during the procedure by the interventional radiology nurse under my direct supervision. COMPLICATIONS: None immediate. PROCEDURE: Informed written consent was obtained from the patient after a thorough discussion of the procedural risks, benefits and alternatives. All questions were addressed. Maximal Sterile Barrier Technique was utilized including caps, mask, sterile gowns, sterile gloves, sterile drape, hand hygiene and skin antiseptic. A timeout was performed prior to the initiation of the procedure. 1% lidocaine was utilized for local anesthesia. Under CT guidance, an 18 gauge needle was directed towards the superior aspect of the right L3-4 facet joint. Aspiration yielded 2 drops of serosanguineous fluid which was sent for culture. FINDINGS: Images document needle placement within the region of the right L3-4  facet joint and posterior soft tissues. IMPRESSION: Successful CT-guided aspiration as described yielding 2 drops serosanguineous fluid. Electronically Signed   By: Marybelle Killings M.D.   On: 05/24/2018 15:42   [place] Ekg Site Rite  Result Date: 05/28/2018 If Site Rite image not attached, placement could not be confirmed due to current cardiac rhythm.   EKG None  Radiology Dg Chest 2 View  Result Date: 06/22/2018 CLINICAL DATA:  New onset fever today. Currently on IV antibiotics for osteomyelitis and spinal abscess. EXAM: CHEST - 2 VIEW COMPARISON:  06/06/2018 FINDINGS: Right PICC line with tip over the low SVC region. No pneumothorax. Heart size and pulmonary vascularity are normal. Lungs are clear. No blunting of costophrenic angles. No pneumothorax. Mediastinal contours appear intact. IMPRESSION: No active cardiopulmonary disease. Electronically Signed  By: Lucienne Capers M.D.   On: 06/22/2018 00:01    Procedures Procedures (including critical care time)  Medications Ordered in ED Medications  sodium chloride flush (NS) 0.9 % injection 3 mL (has no administration in time range)  sodium chloride 0.9 % bolus 1,000 mL (has no administration in time range)  ondansetron (ZOFRAN) injection 4 mg (has no administration in time range)  vancomycin (VANCOCIN) IVPB 1000 mg/200 mL premix (has no administration in time range)  piperacillin-tazobactam (ZOSYN) IVPB 3.375 g (has no administration in time range)     530 case d/w Dr. Baxter Flattery, admit for obs agrees with flu swabs and MRI  Final Clinical Impressions(s) / ED Diagnoses   Will admit for fever on iv antibiotics.  No sepsis criteria   Veleda Mun, MD 06/22/18 2620

## 2018-06-22 NOTE — Progress Notes (Signed)
Pharmacy Antibiotic Note  Mary Ewing is a 48 y.o. female admitted on 06/21/2018 with osteomyelitis and diskitis.  Pharmacy has been consulted for vancomycin and Zosyn dosing.  Plan: Vancomycin 1,500 mg IV x 1, then Vancomycin 750 mg IV Q 12 hrs. Goal AUC 400-550. Expected AUC: 492.6 SCr used: 0.65  Zosyn 3.375 g IV Q 8 hrs (4-hour infusion)  Height: 5\' 2"  (157.5 cm) Weight: 180 lb (81.6 kg) IBW/kg (Calculated) : 50.1  Temp (24hrs), Avg:99.1 F (37.3 C), Min:98.5 F (36.9 C), Max:100.3 F (37.9 C)  Recent Labs  Lab 06/21/18 2344  WBC 8.5  CREATININE 0.65  LATICACIDVEN 0.8    Estimated Creatinine Clearance: 86 mL/min (by C-G formula based on SCr of 0.65 mg/dL).    Allergies  Allergen Reactions  . Sulfonamide Derivatives Hives    Antimicrobials this admission: Vanc 2/29 >>  Zosyn 2/29 >>   Dose adjustments this admission: None  Microbiology results: 2/29 BCx: sent  Thank you for allowing pharmacy to be a part of this patient's care.  Danae Orleans, PharmD PGY1 Pharmacy Resident Phone [phone] 06/22/2018       9:58 AM

## 2018-06-23 DIAGNOSIS — R509 Fever, unspecified: Secondary | ICD-10-CM | POA: Diagnosis not present

## 2018-06-23 DIAGNOSIS — E785 Hyperlipidemia, unspecified: Secondary | ICD-10-CM | POA: Diagnosis not present

## 2018-06-23 DIAGNOSIS — E876 Hypokalemia: Secondary | ICD-10-CM | POA: Diagnosis not present

## 2018-06-23 DIAGNOSIS — D649 Anemia, unspecified: Secondary | ICD-10-CM | POA: Diagnosis not present

## 2018-06-23 LAB — CBC
HCT: 31 % — ABNORMAL LOW (ref 36.0–46.0)
Hemoglobin: 9.6 g/dL — ABNORMAL LOW (ref 12.0–15.0)
MCH: 27.2 pg (ref 26.0–34.0)
MCHC: 31 g/dL (ref 30.0–36.0)
MCV: 87.8 fL (ref 80.0–100.0)
Platelets: 154 10*3/uL (ref 150–400)
RBC: 3.53 MIL/uL — ABNORMAL LOW (ref 3.87–5.11)
RDW: 15.3 % (ref 11.5–15.5)
WBC: 3.2 10*3/uL — ABNORMAL LOW (ref 4.0–10.5)
nRBC: 0 % (ref 0.0–0.2)

## 2018-06-23 LAB — BASIC METABOLIC PANEL
ANION GAP: 6 (ref 5–15)
BUN: <5 mg/dL — ABNORMAL LOW (ref 6–20)
CO2: 25 mmol/L (ref 22–32)
Calcium: 7.8 mg/dL — ABNORMAL LOW (ref 8.9–10.3)
Chloride: 108 mmol/L (ref 98–111)
Creatinine, Ser: 0.61 mg/dL (ref 0.44–1.00)
GFR calc Af Amer: >60 mL/min (ref >60)
GFR calc non Af Amer: >60 mL/min (ref >60)
Glucose, Bld: 104 mg/dL — ABNORMAL HIGH (ref 70–99)
POTASSIUM: 3.5 mmol/L (ref 3.5–5.1)
Sodium: 139 mmol/L (ref 135–145)

## 2018-06-23 MED ORDER — OXYCODONE HCL 5 MG PO TABS: 5.0000 mg | ORAL_TABLET | ORAL | 0 refills | Status: DC | PRN

## 2018-06-23 MED ORDER — SODIUM CHLORIDE 0.9% FLUSH: 10.0000 mL | INTRAVENOUS | Status: DC | PRN

## 2018-06-23 NOTE — Progress Notes (Signed)
NURSING PROGRESS NOTE  Mary Ewing [id number] Discharge Data: 06/23/2018 12:56 PM Attending Provider: Norval Morton, MD [id number], Mary Rodney, MD     Mary Ewing to be D/C'd Home per MD order.  Discussed with the patient the After Visit Summary and all questions fully answered. All IV's discontinued with no bleeding noted. All belongings returned to patient for patient to take home.    PATIENT IS WAITING FOR HER RIDE AT THIS TIME. RN OFFERED BUS PASS BUT PT DECLINED AND VERBALIZES THAT SHE WILL GET A RIDE.   Last Vital Signs:  Blood pressure (!) 130/56, pulse 80, temperature 98 F (36.7 C), temperature source Oral, resp. rate 18, height _0  (1.575 m), weight 81.6 kg, last menstrual period 06/05/2018, SpO2 100 %.  Discharge Medication List Allergies as of 06/23/2018      Reactions   Sulfonamide Derivatives Hives      Medication List    STOP taking these medications   baclofen 10 MG tablet Commonly known as:  LIORESAL   docusate sodium 100 MG capsule Commonly known as:  COLACE   naproxen sodium 220 MG tablet Commonly known as:  ALEVE   traMADol 50 MG tablet Commonly known as:  ULTRAM     TAKE these medications   acetaminophen 325 MG tablet Commonly known as:  TYLENOL Take 2 tablets (650 mg total) by mouth every 6 (six) hours as needed for mild pain (or Fever >/= 101).   Acidophilus Caps capsule Take 1 capsule by mouth 2 (two) times daily.   aspirin-acetaminophen-caffeine 250-250-65 MG tablet Commonly known as:  EXCEDRIN MIGRAINE Take 2 tablets by mouth every 8 (eight) hours as needed for headache or migraine (or pain).   cefTRIAXone  IVPB Commonly known as:  ROCEPHIN Inject 2 g into the vein daily. Indication:  Culture negative discitis Last Day of Therapy:  07/08/2018 Labs - Once weekly:  CBC/D and BMP, Labs - Every other week:  ESR and CRP   dimenhyDRINATE 50 MG tablet Commonly known as:  DRAMAMINE Take 50 mg by mouth every 6 (six) hours as needed for  nausea.   FLUoxetine 20 MG capsule Commonly known as:  PROZAC Take 60 mg by mouth every morning.   fluticasone 50 MCG/ACT nasal spray Commonly known as:  FLONASE Place 2 sprays into both nostrils daily as needed for allergies or rhinitis.   gabapentin 300 MG capsule Commonly known as:  NEURONTIN Take 600 mg by mouth 3 (three) times daily.   LIPITOR 20 MG tablet Generic drug:  atorvastatin Take 20 mg by mouth daily at 6 PM.   Melatonin 10 MG Tabs Take 10 mg by mouth at bedtime.   metroNIDAZOLE 500 MG tablet Commonly known as:  FLAGYL Take 1 tablet (500 mg total) by mouth 3 (three) times daily.   nicotine 14 mg/24hr patch Commonly known as:  NICODERM CQ - dosed in mg/24 hours Place 1 patch (14 mg total) onto the skin daily. What changed:  Another medication with the same name was removed. Continue taking this medication, and follow the directions you see here.   oxyCODONE 5 MG immediate release tablet Commonly known as:  Oxy IR/ROXICODONE Take 1 tablet (5 mg total) by mouth every 4 (four) hours as needed for severe pain or breakthrough pain.   QUEtiapine 300 MG tablet Commonly known as:  SEROQUEL Take 300 mg by mouth at bedtime.   THERA-M Tabs Take 1 tablet by mouth daily.   Vitamin D3 10 MCG (400  UNIT) Caps Take 400 Units by mouth daily.            Home Infusion Instuctions  (From admission, onward)         Start     Ordered   06/23/18 0000  Home infusion instructions [facility] May follow [place] Dosing Protocol; May administer Cathflo as needed to maintain patency of vascular access device.; Flushing of vascular access device: per [facility] Protocol: 0.9% NaCl pre/post medica...    Question Answer Comment  Instructions May follow [place] Dosing Protocol   Instructions May administer Cathflo as needed to maintain patency of vascular access device.   Instructions Flushing of vascular access device: per [facility] Protocol: 0.9% NaCl pre/post medication  administration and prn patency; Heparin 100 u/ml, 24m for implanted ports and Heparin 10u/ml, 596mfor all other central venous catheters.   Instructions May follow [facility] Anaphylaxis Protocol for First Dose Administration in the home: 0.9% NaCl at 25-50 ml/hr to maintain IV access for protocol meds. Epinephrine 0.3 ml IV/IM PRN and Benadryl 25-50 IV/IM PRN s/s of anaphylaxis.   Instructions [facility] Infusion Coordinator (RN) to assist per patient IV care needs in the home PRN.      06/23/18 [id number]

## 2018-06-23 NOTE — Discharge Summary (Addendum)
Mary Ewing, is a 48 y.o. female  DOB 1970/07/14  MRN [id number].  Admission date:  06/21/2018  Admitting Physician  Shela Leff, MD  Discharge Date:  06/23/2018   Primary MD  Katherina Mires, MD  Recommendations for primary care physician for things to follow:   Follow-up blood cultures  Discharge Diagnosis  Principal Problem:   Fever Active Problems:   HLD (hyperlipidemia)   Discitis of lumbar region   Hypokalemia   Anemia   Substance abuse ([place])      Past Medical History:  Diagnosis Date  . Acute renal failure ([place])   . Anxiety   . Asthmatic bronchitis    history of  . Depression   . DJD (degenerative joint disease)   . GERD (gastroesophageal reflux disease)   . H/O: suicide attempt    x 3  . High cholesterol   . Iron deficiency anemia   . Substance abuse [facility])     Past Surgical History:  Procedure Laterality Date  . APPENDECTOMY  2004  . CESAREAN SECTION    . MOUTH SURGERY     teeth removed  . REFRACTIVE SURGERY    . right tympanoplasty  1999  . TONSILLECTOMY         HPI  from the history and physical done on the day of admission:    HPI: Mary Ewing is a 48 y.o. female with medical history significant of asthma, anxiety, depression, GERD, hyperlipidemia, anemia, substance abuse, currently on IV Rocephin at home for osteomyelitis and spinal abscess presenting to the hospital for evaluation of fever.  Patient reports having a fever of 102.7 F yesterday.  She has been vomiting and not been able to eat.  Having loose stools and diarrhea for the past 1 week.  Denies having any abdominal pain.  States infectious disease stopped Flagyl yesterday as she was having diarrhea.  Reports having worsening lower back pain and swelling.  Denies having any body aches, rhinorrhea, or sore throat.  Denies  having any chest pain or shortness of breath.  Patient with a history of lumbar osteomyelitis.  Admitted 1/30 to 2/5 for culture-negative discitis, L3-L4, dental caries.  Source of discitis was suspected to be her teeth.  ID had started her on ceftriaxone and Flagyl for 6 weeks starting 2/4.     Hospital Course:  1.  Fever, recent admission for discitis: Resolved.  Patient reported fevers at home, but temperature only elevated to 100.3 F in the hospital.  Hemodynamically stable.  No leukocytosis and lactic acid normal.  Chest x-ray negative for acute finding.  UA not suggestive of infection.  PICC line without erythema or signs of infection.  Patient was recently admitted for culture-negative discitis, L3-L4, thought to be secondary to dental caries.  Discharged on ceftriaxone and Flagyl.  MRI of the lumbar spine revealed similar inflammatory changes with 3 cm fluid collection still present and 1 cm fluid collection less appreciated.  Blood cultures were obtained after start of  vancomycin and Zosyn,  but noted to be negative after 24 hours.  Influenza screen testing was negative.  Case was discussed with infectious disease who recommended restarting patient back on metronidazole as tolerated and Rocephin at discharge.  Suspect symptoms fever, nausea, vomiting, and diarrhea could have been related to recent use of marijuana and cocaine.  Patient was reminded to keep follow-up appointment with Dr. Drucilla Schmidt on 3/3.  Patient already noted to have advanced home health care services.  2.  Nausea, vomiting, and diarrhea: Resolving.  Infectious disease thought that nausea or vomiting symptoms may have been related to metronidazole for which patient was recommended to stop this medication on 2/28. Patient denied having any significance of nausea or vomiting since being admitted into the hospital.  Abdominal exam benign.  LFTs normal.  She continued to have diarrhea during her hospital stay.  C. difficile testing was  negative.   3.  Hypokalemia: Replaced.  Admission potassium noted to be as low as 3.1, but noted to be within normal limits after replacement.  4.  Chronic anemia: Hemoglobin 9.6, but patient reports was drawn off PICC line. Baseline 11.1-11.7.  No signs of active bleeding.  5.  Polysubstance abuse: Patient has urine drug screen positive for marijuana and cocaine.  She denies knowledge of cocaine use, but admitted to using marijuana.  Counseled patient on need cessation of illicit drug use.  6.  Hyperlipidemia: Continued Lipitor  7.  Depression: Continued Prozac  8.  Chronic pain: Continued OxyIR and gabapentin  Follow UP  Follow-up Information    Tommy Medal, Lavell Islam, MD. Go on 06/25/2018.   Specialty:  Infectious Diseases Why:  At 2:45PM as previously scheduled Contact information: [address] [place] [address] [phone]            Consults obtained: Discussed case with Dr. Carlyle Basques over the phone prior to discharge patient home.  Discharge Condition: Stable  Diet and Activity recommendation: See Discharge Instructions below  Discharge Instructions    Discharge instructions   Complete by:  As directed    Follow with Primary MD Katherina Mires, MD in within 1 week.  There or no clear signs of infection noted.  The exact cause of your fever, nausea, vomiting, and diarrhea symptoms is not totally clear.  You tested negative for flu and a bacteria that can cause diarrhea.  Suspect that symptoms could have been related with recent use of marijuana and cocaine.  We advised that she refrain from any use of marijuana and cocaine.  Please keep your follow-up appointment with Dr. Drucilla Schmidt on 3/3.  I have discussed your case with infectious disease and they recommend that you continue the metronidazole unless nausea and vomiting returns.  Follow-up with advanced home care and continue Rocephin IV infusions as previously instructed. Get CBC and CMP-  checked  by  Primary MD or SNF MD in 5-7 days ( we routinely change or add medications that can affect your baseline labs and fluid status, therefore we recommend that you get the mentioned basic workup next visit with your PCP, your PCP may decide not to get them or add new tests based on their clinical decision)  Activity: As tolerated   Disposition: Home    Diet: Heart healthy         Special Instructions: If you have smoked or chewed Tobacco  in the last 2 yrs please stop smoking, stop any regular Alcohol  and or any Recreational drug use.  On your next visit  with your primary care physician please Get Medicines reviewed and adjusted.  Please request your Katherina Mires, MD to go over all Hospital Tests and Procedure/Radiological results at the follow up, please get all Hospital records sent to your Prim MD by signing hospital release before you go home.  If you experience worsening of your admission symptoms, develop shortness of breath, life threatening emergency, suicidal or homicidal thoughts you must seek medical attention immediately by calling 911 or calling your MD immediately  if symptoms less severe.  You Must read complete instructions/literature along with all the possible adverse reactions/side effects for all the Medicines you take and that have been prescribed to you. Take any new Medicines after you have completely understood and accpet all the possible adverse reactions/side effects.   Do not drive, operate heavy machinery, perform activities at heights, swimming or participation in water activities or provide baby sitting services if your were admitted for syncope or siezures until you have seen by Primary MD or a Neurologist and advised to do so again.  Do not drive when taking Pain medications.  Do not take more than prescribed Pain, Sleep and Anxiety Medications  Wear Seat belts while driving.   Please note  You were cared for by a hospitalist during your hospital stay. If  you have any questions about your discharge medications or the care you received while you were in the hospital after you are discharged, you can call the unit and asked to speak with the hospitalist on call if the hospitalist that took care of you is not available. Once you are discharged, your primary care physician will handle any further medical issues. Please note that NO REFILLS for any discharge medications will be authorized once you are discharged, as it is imperative that you return to your primary care physician (or establish a relationship with a primary care physician if you do not have one) for your aftercare needs so that they can reassess your need for medications and monitor your lab values.   Home infusion instructions [facility] May follow [place] Dosing Protocol; May administer Cathflo as needed to maintain patency of vascular access device.; Flushing of vascular access device: per [facility] Protocol: 0.9% NaCl pre/post medica...   Complete by:  As directed    Instructions:  May follow [place] Dosing Protocol   Instructions:  May administer Cathflo as needed to maintain patency of vascular access device.   Instructions:  Flushing of vascular access device: per [facility] Protocol: 0.9% NaCl pre/post medication administration and prn patency; Heparin 100 u/ml, 55m for implanted ports and Heparin 10u/ml, 514mfor all other central venous catheters.   Instructions:  May follow [facility] Anaphylaxis Protocol for First Dose Administration in the home: 0.9% NaCl at 25-50 ml/hr to maintain IV access for protocol meds. Epinephrine 0.3 ml IV/IM PRN and Benadryl 25-50 IV/IM PRN s/s of anaphylaxis.   Instructions:  [facility] Coordinator (RN) to assist per patient IV care needs in the home PRN.        Discharge Medications     Allergies as of 06/23/2018      Reactions   Sulfonamide Derivatives Hives      Medication List    STOP taking these medications   baclofen 10 MG  tablet Commonly known as:  LIORESAL   docusate sodium 100 MG capsule Commonly known as:  COLACE   naproxen sodium 220 MG tablet Commonly known as:  ALEVE   traMADol 50  MG tablet Commonly known as:  ULTRAM     TAKE these medications   acetaminophen 325 MG tablet Commonly known as:  TYLENOL Take 2 tablets (650 mg total) by mouth every 6 (six) hours as needed for mild pain (or Fever >/= 101).   Acidophilus Caps capsule Take 1 capsule by mouth 2 (two) times daily.   aspirin-acetaminophen-caffeine 250-250-65 MG tablet Commonly known as:  EXCEDRIN MIGRAINE Take 2 tablets by mouth every 8 (eight) hours as needed for headache or migraine (or pain).   cefTRIAXone  IVPB Commonly known as:  ROCEPHIN Inject 2 g into the vein daily. Indication:  Culture negative discitis Last Day of Therapy:  07/08/2018 Labs - Once weekly:  CBC/D and BMP, Labs - Every other week:  ESR and CRP   dimenhyDRINATE 50 MG tablet Commonly known as:  DRAMAMINE Take 50 mg by mouth every 6 (six) hours as needed for nausea.   FLUoxetine 20 MG capsule Commonly known as:  PROZAC Take 60 mg by mouth every morning.   fluticasone 50 MCG/ACT nasal spray Commonly known as:  FLONASE Place 2 sprays into both nostrils daily as needed for allergies or rhinitis.   gabapentin 300 MG capsule Commonly known as:  NEURONTIN Take 600 mg by mouth 3 (three) times daily.   LIPITOR 20 MG tablet Generic drug:  atorvastatin Take 20 mg by mouth daily at 6 PM.   Melatonin 10 MG Tabs Take 10 mg by mouth at bedtime.   metroNIDAZOLE 500 MG tablet Commonly known as:  FLAGYL Take 1 tablet (500 mg total) by mouth 3 (three) times daily.   nicotine 14 mg/24hr patch Commonly known as:  NICODERM CQ - dosed in mg/24 hours Place 1 patch (14 mg total) onto the skin daily. What changed:  Another medication with the same name was removed. Continue taking this medication, and follow the directions you see here.   oxyCODONE 5 MG  immediate release tablet Commonly known as:  Oxy IR/ROXICODONE Take 1 tablet (5 mg total) by mouth every 4 (four) hours as needed for severe pain or breakthrough pain.   QUEtiapine 300 MG tablet Commonly known as:  SEROQUEL Take 300 mg by mouth at bedtime.   THERA-M Tabs Take 1 tablet by mouth daily.   Vitamin D3 10 MCG (400 UNIT) Caps Take 400 Units by mouth daily.            Home Infusion Instuctions  (From admission, onward)         Start     Ordered   06/23/18 0000  Home infusion instructions [facility] May follow [place] Dosing Protocol; May administer Cathflo as needed to maintain patency of vascular access device.; Flushing of vascular access device: per [facility] Protocol: 0.9% NaCl pre/post medica...    Question Answer Comment  Instructions May follow [place] Dosing Protocol   Instructions May administer Cathflo as needed to maintain patency of vascular access device.   Instructions Flushing of vascular access device: per [facility] Protocol: 0.9% NaCl pre/post medication administration and prn patency; Heparin 100 u/ml, 32m for implanted ports and Heparin 10u/ml, 525mfor all other central venous catheters.   Instructions May follow [facility] Anaphylaxis Protocol for First Dose Administration in the home: 0.9% NaCl at 25-50 ml/hr to maintain IV access for protocol meds. Epinephrine 0.3 ml IV/IM PRN and Benadryl 25-50 IV/IM PRN s/s of anaphylaxis.   Instructions [facility] Infusion Coordinator (RN) to assist per patient IV care needs in the home  PRN.      06/23/18 7681          Major procedures and Radiology Reports - PLEASE review detailed and final reports for all details, in brief -      Dg Orthopantogram  Result Date: 05/27/2018 CLINICAL DATA:  Patient complains of 2 abscessed teeth and TMJ issues x couple years now. Patient states she only has about 10 teeth left and that some of them are cracked. Patient states they are trying to rule out infection  from spine. Current smoker EXAM: ORTHOPANTOGRAM/PANORAMIC COMPARISON:  Cervical spine radiographs 04/22/2015 FINDINGS: Multiple prior tooth extractions or resorptions. The remaining teeth demonstrate multiple dental caries. Periapical lucencies have multiple teeth suggesting periodontal disease. No acute fracture or dislocation of the mandible. IMPRESSION: Multiple dental caries and periapical lucencies consistent with periodontal disease. Multiple prior tooth extractions or resorption. Electronically Signed   By: Lucienne Capers M.D.   On: 05/27/2018 22:22   Dg Chest 2 View  Result Date: 06/22/2018 CLINICAL DATA:  New onset fever today. Currently on IV antibiotics for osteomyelitis and spinal abscess. EXAM: CHEST - 2 VIEW COMPARISON:  06/06/2018 FINDINGS: Right PICC line with tip over the low SVC region. No pneumothorax. Heart size and pulmonary vascularity are normal. Lungs are clear. No blunting of costophrenic angles. No pneumothorax. Mediastinal contours appear intact. IMPRESSION: No active cardiopulmonary disease. Electronically Signed   By: Lucienne Capers M.D.   On: 06/22/2018 00:01   Dg Chest 2 View  Result Date: 06/06/2018 CLINICAL DATA:  PICC line position check EXAM: CHEST - 2 VIEW COMPARISON:  None. FINDINGS: Tip of the right-sided PICC line projects over the lower SVC. Lungs are clear. Normal cardiomediastinal contours. IMPRESSION: Right-sided PICC line tip projects over the lower SVC. Electronically Signed   By: Ulyses Jarred M.D.   On: 06/06/2018 01:44   Mr Lumbar Spine Wo Contrast  Result Date: 06/22/2018 CLINICAL DATA:  Initial evaluation for recurrent fever. EXAM: MRI LUMBAR SPINE WITHOUT CONTRAST TECHNIQUE: Multiplanar, multisequence MR imaging of the lumbar spine was performed. No intravenous contrast was administered. COMPARISON:  Prior MRI from 05/27/2018 FINDINGS: Segmentation: Examination degraded by motion artifact. Transitional lumbosacral anatomy with sacralization of the L5  vertebral body. Same numbering system is employed as on previous exam. Alignment: Trace retrolisthesis of L3 on L4, stable. Alignment otherwise normal with preservation of the normal lumbar lordosis. Vertebrae: Vertebral body height maintained without evidence for acute or interval fracture. No evidence for acute discitis. Bone marrow signal intensity diffusely decreased on T1 weighted imaging, most commonly related to anemia, smoking, or obesity. No discrete or worrisome osseous lesions. Inflammatory changes centered about the right L3-4 facet with associated reactive marrow edema seen, similar to previous. Conus medullaris and cauda equina: Conus extends to the T12-L1 level. Conus and cauda equina appear normal. Paraspinal and other soft tissues: Paraspinous edema adjacent to the left L3-4 facet. T2 hyperintense collection within the right posterior paraspinous musculature again seen, measuring approximately 1.2 x 0.7 x 3.0 cm on today's exam, similar. Again, this is seen communicating with the underlying right L3-4 facet, and courses posteriorly and superiorly. Paraspinous soft tissues demonstrate no other acute finding. Visualized visceral structures within normal limits. Disc levels: L1-2:  Unremarkable. L2-3:  Unremarkable. L3-4: Trace retrolisthesis. Mild diffuse disc bulge with disc desiccation and intervertebral disc space narrowing. Persistent marrow edema about the right L3-4 facet, with small bilateral joint effusions, right greater than left. Abnormal T1 hypointensity along the right dorsal lateral epidural space, similar to  previous. Previously noted small epidural collection at the dorsal epidural space not as well seen on today's motion degraded exam. Similar mild right lateral recess stenosis. Mild to moderate right L3 foraminal narrowing appears mildly worsened. Central canal remains widely patent. No significant left foraminal narrowing. L4-5:  Unremarkable. L5-S1:  Unremarkable. IMPRESSION: 1.  Persistent inflammatory changes surrounding the right L3-4 facet with infiltration of the adjacent epidural fat. Relatively similar 3.0 cm fluid collection within the posterior right paraspinous soft tissues. Previously seen 1 cm fluid collection at the dorsal epidural space not as well seen on today's motion degraded exam. 2. Similar mild right lateral recess stenosis at L3-4, with mildly worsened mild to moderate right L3 foraminal narrowing. Electronically Signed   By: Jeannine Boga M.D.   On: 06/22/2018 06:31   Mr Lumbar Spine Wo Contrast  Result Date: 05/27/2018 CLINICAL DATA:  Possible right L3-L4 septic arthritis status post aspiration. Persistent back pain with bilateral leg numbness and weakness. EXAM: MRI LUMBAR SPINE WITHOUT CONTRAST TECHNIQUE: Multiplanar, multisequence MR imaging of the lumbar spine was performed. No intravenous contrast was administered. COMPARISON:  MRI lumbar spine dated May 19, 2018. FINDINGS: Segmentation:  Unchanged sacralization of L5. Alignment:  Physiologic. Vertebrae: No fracture, evidence of discitis, or bone lesion. Unchanged right facet and perifacet edema at L3-L4 extending into both right L3 and L4 pedicles. Unchanged small facet joint effusion. Unchanged 1.1 x 0.7 x 3.1 cm fluid collection extending posteriorly and superiorly from facet joint. Unchanged surrounding soft tissue edema. Conus medullaris and cauda equina: Conus extends to the T12-L1 level. Conus and cauda equina appear normal. Paraspinal and other soft tissues: Cholelithiasis. Disc levels: T11-T12: Negative. T12-L1:  Negative. L1-L2:  Negative. L2-L3:  Negative. L3-L4: Unchanged shallow disc bulge small superimposed left subarticular and foraminal disc protrusion. Unchanged abnormal low T1 signal in the epidural fat along the posterior and right lateral thecal sac. 5 x 5 x 10 mm fluid collection in the posterior epidural space is unchanged. Unchanged mild right lateral recess and neuroforaminal  stenosis. No spinal canal or left neuroforaminal stenosis. L4-L5:  Negative. L5-S1:  Negative. IMPRESSION: 1. Stable inflammatory changes surrounding the right L3-L4 facet joint with infiltration/effacement of the epidural fat along the right and posterior aspects of the thecal sac. Unchanged 3.1 cm fluid collection in the posterior right paraspinous soft tissues and 1.0 cm fluid collection in the posterior epidural space. These could represent synovial cysts or abscesses. 2. Unchanged mild right lateral recess and neuroforaminal stenosis at L3-L4. 3. Cholelithiasis. Electronically Signed   By: Titus Dubin M.D.   On: 05/27/2018 13:43   Ct Aspiration  Result Date: 05/24/2018 INDICATION: Suspected septic right L3-4 facet arthritis EXAM: CT-GUIDED ASPIRATION MEDICATIONS: The patient is currently admitted to the hospital and receiving intravenous antibiotics. The antibiotics were administered within an appropriate time frame prior to the initiation of the procedure. ANESTHESIA/SEDATION: Fentanyl 75 mcg IV; Versed 1.5 mg IV Moderate Sedation Time:  10 minutes The patient was continuously monitored during the procedure by the interventional radiology nurse under my direct supervision. COMPLICATIONS: None immediate. PROCEDURE: Informed written consent was obtained from the patient after a thorough discussion of the procedural risks, benefits and alternatives. All questions were addressed. Maximal Sterile Barrier Technique was utilized including caps, mask, sterile gowns, sterile gloves, sterile drape, hand hygiene and skin antiseptic. A timeout was performed prior to the initiation of the procedure. 1% lidocaine was utilized for local anesthesia. Under CT guidance, an 18 gauge needle was directed towards the  superior aspect of the right L3-4 facet joint. Aspiration yielded 2 drops of serosanguineous fluid which was sent for culture. FINDINGS: Images document needle placement within the region of the right L3-4  facet joint and posterior soft tissues. IMPRESSION: Successful CT-guided aspiration as described yielding 2 drops serosanguineous fluid. Electronically Signed   By: Marybelle Killings M.D.   On: 05/24/2018 15:42   [place] Ekg Site Rite  Result Date: 05/28/2018 If Site Rite image not attached, placement could not be confirmed due to current cardiac rhythm.   Micro Results    Recent Results (from the past 240 hour(s))  Blood culture (routine x 2)     Status: None (Preliminary result)   Collection Time: 06/22/18  4:30 AM  Result Value Ref Range Status   Specimen Description BLOOD LEFT HAND  Final   Special Requests   Final    BOTTLES DRAWN AEROBIC ONLY Blood Culture results may not be optimal due to an excessive volume of blood received in culture bottles   Culture   Final    NO GROWTH 1 DAY Performed at [facility] [address] [place], [place] [address]    Report Status PENDING  Incomplete  Blood culture (routine x 2)     Status: None (Preliminary result)   Collection Time: 06/22/18  5:04 AM  Result Value Ref Range Status   Specimen Description BLOOD RIGHT ARM  Final   Special Requests   Final    BOTTLES DRAWN AEROBIC AND ANAEROBIC Blood Culture results may not be optimal due to an excessive volume of blood received in culture bottles   Culture   Final    NO GROWTH 1 DAY Performed at [facility], [place] [address], [place], [place] [address]    Report Status PENDING  Incomplete  C difficile quick scan w PCR reflex     Status: None   Collection Time: 06/22/18  6:43 AM  Result Value Ref Range Status   C Diff antigen NEGATIVE NEGATIVE Final   C Diff toxin NEGATIVE NEGATIVE Final   C Diff interpretation No C. difficile detected.  Final    Comment: Performed at [facility], [place] [address], [place], [place] [address]       Today   Subjective    Karianne Nogueira today has no nausea or vomiting symptoms, but still has had loose stools.  Denies any blood in stools.   Patient states that she did not know that cocaine was in her system.  She is unsure if it could have been mixed in with the week that she smoked.   Objective   Blood pressure 103/66, pulse 73, temperature 98.3 F (36.8 C), temperature source Oral, resp. rate 19, height '5\' 2"'  (1.575 m), weight 81.6 kg, last menstrual period 06/05/2018, SpO2 97 %.   Intake/Output Summary (Last 24 hours) at 06/23/2018 0955 Last data filed at 06/23/2018 0900 Gross per 24 hour  Intake 2148.94 ml  Output -  Net 2148.94 ml    Exam  Constitutional: NAD, calm, comfortable Eyes: PERRL, lids and conjunctivae normal ENMT: Mucous membranes are moist. Posterior pharynx clear of any exudate or lesions.Normal dentition.  Neck: normal, supple, no masses, no thyromegaly Respiratory: clear to auscultation bilaterally, no wheezing, no crackles. Normal respiratory effort. No accessory muscle use.  Cardiovascular: Regular rate and rhythm, no murmurs / rubs / gallops. No extremity edema. 2+ pedal pulses. No carotid bruits.  PICC line present of the right upper extremity with  no signs of erythema noted. Abdomen: no tenderness, no masses palpated. No hepatosplenomegaly. Bowel sounds positive.  Musculoskeletal: no clubbing / cyanosis. No joint deformity upper and lower extremities. Good ROM, no contractures. Normal muscle tone.  Skin: no rashes, lesions, ulcers. No induration Neurologic: CN 2-12 grossly intact. Sensation intact, DTR normal. Strength 5/5 in all 4.  Psychiatric: Normal judgment and insight. Alert and oriented x 3. Normal mood.    Data Review   CBC w Diff:  Lab Results  Component Value Date   WBC 3.2 (L) 06/23/2018   HGB 9.6 (L) 06/23/2018   HCT 31.0 (L) 06/23/2018   PLT 154 06/23/2018   LYMPHOPCT 9 06/21/2018   MONOPCT 9 06/21/2018   EOSPCT 0 06/21/2018   BASOPCT 0 06/21/2018    CMP:  Lab Results  Component Value Date   NA 139 06/23/2018   K 3.5 06/23/2018   CL 108 06/23/2018   CO2 25 06/23/2018     BUN <5 (L) 06/23/2018   CREATININE 0.61 06/23/2018   PROT 7.6 06/21/2018   ALBUMIN 4.1 06/21/2018   BILITOT 0.3 06/21/2018   ALKPHOS 77 06/21/2018   AST 32 06/21/2018   ALT 17 06/21/2018  .   Total Time in preparing paper work, data evaluation and todays exam - 35 minutes  Norval Morton M.D on 06/23/2018 at 9:55 AM  [facility]   Office  [phone]

## 2018-06-25 ENCOUNTER — Telehealth: Payer: Self-pay | Admitting: *Deleted

## 2018-06-25 ENCOUNTER — Encounter: Payer: Self-pay | Admitting: Infectious Disease

## 2018-06-25 ENCOUNTER — Ambulatory Visit (INDEPENDENT_AMBULATORY_CARE_PROVIDER_SITE_OTHER): Payer: Medicare Other | Admitting: Infectious Disease

## 2018-06-25 VITALS — BP 150/55 | HR 108 | Temp 98.8°F | Wt 181.0 lb

## 2018-06-25 DIAGNOSIS — M542 Cervicalgia: Secondary | ICD-10-CM | POA: Diagnosis not present

## 2018-06-25 DIAGNOSIS — K047 Periapical abscess without sinus: Secondary | ICD-10-CM | POA: Diagnosis not present

## 2018-06-25 DIAGNOSIS — F1120 Opioid dependence, uncomplicated: Secondary | ICD-10-CM

## 2018-06-25 DIAGNOSIS — M4646 Discitis, unspecified, lumbar region: Secondary | ICD-10-CM | POA: Diagnosis not present

## 2018-06-25 DIAGNOSIS — R200 Anesthesia of skin: Secondary | ICD-10-CM | POA: Diagnosis not present

## 2018-06-25 DIAGNOSIS — F1990 Other psychoactive substance use, unspecified, uncomplicated: Secondary | ICD-10-CM

## 2018-06-25 HISTORY — DX: Anesthesia of skin: R20.0

## 2018-06-25 NOTE — Progress Notes (Signed)
Subjective:   Chief complaint: Worsening pain in her neck with numbness of her arms at night when she sleeps along with persistent lower back pain now with more back pain on the left with radiation down the left side.   Patient ID: Mary Ewing, female    DOB: Mar 07, 1971, 48 y.o.   MRN: [id number]  HPI 48 year old female with previous history of opioid use disorder/heroin use (clean for 1 year) admitted with worsening back pain and found to have a spinal abscess. IR aspiration with no organisms on gram stain and no growth on cultures. Concern source of the infection may from her poor dentition. She refused IR guided 2nd aspirate  And was starteed on IV ceftriaxone and oral metronidazole with planned 6 week course  Along the way she was readmitted with nausea and vomiting and the metronidazole was stopped.  She see a dentist but found out that the bill will be $1600 to take care of the teeth that are likely the cause of her infection.  Since then she was also readmitted with worsening back pain   Pete MRI done in late February showed:  1. Persistent inflammatory changes surrounding the right L3-4 facet with infiltration of the adjacent epidural fat. Relatively similar 3.0 cm fluid collection within the posterior right paraspinous soft tissues. Previously seen 1 cm fluid collection at the dorsal epidural space not as well seen on today's motion degraded exam. 2. Similar mild right lateral recess stenosis at L3-4, with mildly worsened mild to moderate right L3 foraminal narrowing.   Ells me that the back pain she had before is not improved much and in fact now she has pain in the left side to that radiates down the left side and she has sensation of her legs getting weaker and is concerned that her legs may give out.  Also asked me if I could prescribe pain medications or whether she should be asking Dr. Trenton Gammon for this I directed her to get with Dr. Trenton Gammon.  Finally she also is  complaining of neck pain with bilateral numbness in her arm after she sleeps at night.  The pain during the day continues in the neck but the numbness comes on at night when she sleeps.  Of note her C-spine was not imaged during her last hospitalization.  Past Medical History:  Diagnosis Date  . Acute renal failure ([place])   . Anxiety   . Asthmatic bronchitis    history of  . Depression   . DJD (degenerative joint disease)   . GERD (gastroesophageal reflux disease)   . H/O: suicide attempt    x 3  . High cholesterol   . Iron deficiency anemia   . Substance abuse [facility])     Past Surgical History:  Procedure Laterality Date  . APPENDECTOMY  2004  . CESAREAN SECTION    . MOUTH SURGERY     teeth removed  . REFRACTIVE SURGERY    . right tympanoplasty  1999  . TONSILLECTOMY      Family History  Problem Relation Age of Onset  . Asthma Mother   . COPD Mother   . Anxiety disorder Mother   . Diabetes Mother   . Depression Mother   . Hypertension Mother       Social History   Socioeconomic History  . Marital status: Single    Spouse name: Not on file  . Number of children: Not on file  . Years of education: Not on  file  . Highest education level: Not on file  Occupational History  . Occupation: disability  Social Needs  . Financial resource strain: Not on file  . Food insecurity:    Worry: Not on file    Inability: Not on file  . Transportation needs:    Medical: Not on file    Non-medical: Not on file  Tobacco Use  . Smoking status: Current Every Day Smoker    Packs/day: 1.00    Years: 24.00    Pack years: 24.00    Types: Cigarettes  . Smokeless tobacco: Never Used  . Tobacco comment: needs a patch  Substance and Sexual Activity  . Alcohol use: Yes    Alcohol/week: 0.0 standard drinks    Comment: occ  . Drug use: Yes    Types: Cocaine, Marijuana    Comment: prescription drugs;last use of marijuana was this AM, denies cocaine   . Sexual activity: Never     Birth control/protection: None  Lifestyle  . Physical activity:    Days per week: Not on file    Minutes per session: Not on file  . Stress: Not on file  Relationships  . Social connections:    Talks on phone: Not on file    Gets together: Not on file    Attends religious service: Not on file    Active member of club or organization: Not on file    Attends meetings of clubs or organizations: Not on file    Relationship status: Not on file  Other Topics Concern  . Not on file  Social History Narrative  . Not on file    Allergies  Allergen Reactions  . Sulfonamide Derivatives Hives     Current Outpatient Medications:  .  acetaminophen (TYLENOL) 325 MG tablet, Take 2 tablets (650 mg total) by mouth every 6 (six) hours as needed for mild pain (or Fever >/= 101)., Disp: , Rfl:  .  aspirin-acetaminophen-caffeine (EXCEDRIN MIGRAINE) 250-250-65 MG tablet, Take 2 tablets by mouth every 8 (eight) hours as needed for headache or migraine (or pain). , Disp: , Rfl:  .  atorvastatin (LIPITOR) 20 MG tablet, Take 20 mg by mouth daily at 6 PM. , Disp: , Rfl:  .  cefTRIAXone (ROCEPHIN) IVPB, Inject 2 g into the vein daily. 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  Cholecalciferol (VITAMIN D3) 10 MCG (400 UNIT) CAPS, Take 400 Units by mouth daily., Disp: , Rfl:  .  dimenhyDRINATE (DRAMAMINE) 50 MG tablet, Take 50 mg by mouth every 6 (six) hours as needed for nausea., Disp: , Rfl:  .  FLUoxetine (PROZAC) 20 MG capsule, Take 60 mg by mouth every morning. , Disp: , Rfl:  .  fluticasone (FLONASE) 50 MCG/ACT nasal spray, Place 2 sprays into both nostrils daily as needed for allergies or rhinitis. , Disp: , Rfl:  .  gabapentin (NEURONTIN) 300 MG capsule, Take 600 mg by mouth 3 (three) times daily., Disp: , Rfl:  .  Lactobacillus (ACIDOPHILUS) CAPS capsule, Take 1 capsule by  mouth 2 (two) times daily., Disp: , Rfl:  .  Melatonin 10 MG TABS, Take 10 mg by mouth at bedtime., Disp: , Rfl:  .  metroNIDAZOLE (FLAGYL) 500 MG tablet, Take 1 tablet (500 mg total) by mouth 3 (three) times daily. (Patient not taking: Reported on 06/22/2018), Disp:  123 tablet, Rfl: 0 .  Multiple Vitamins-Minerals (THERA-M) TABS, Take 1 tablet by mouth daily. , Disp: , Rfl:  .  nicotine (NICODERM CQ - DOSED IN MG/24 HOURS) 14 mg/24hr patch, Place 1 patch (14 mg total) onto the skin daily. (Patient not taking: Reported on 06/22/2018), Disp: 28 patch, Rfl: 0 .  oxyCODONE (OXY IR/ROXICODONE) 5 MG immediate release tablet, Take 1 tablet (5 mg total) by mouth every 4 (four) hours as needed for severe pain or breakthrough pain., Disp: 12 tablet, Rfl: 0 .  QUEtiapine (SEROQUEL) 300 MG tablet, Take 300 mg by mouth at bedtime. , Disp: , Rfl:     Review of Systems  Constitutional: Negative for activity change, appetite change, chills, diaphoresis, fatigue, fever and unexpected weight change.  HENT: Negative for congestion, rhinorrhea, sinus pressure, sneezing, sore throat and trouble swallowing.   Eyes: Negative for photophobia and visual disturbance.  Respiratory: Negative for cough, chest tightness, shortness of breath, wheezing and stridor.   Cardiovascular: Negative for chest pain, palpitations and leg swelling.  Gastrointestinal: Negative for abdominal distention, abdominal pain, anal bleeding, blood in stool, constipation, diarrhea, nausea and vomiting.  Genitourinary: Negative for difficulty urinating, dysuria, flank pain and hematuria.  Musculoskeletal: Positive for back pain. Negative for arthralgias, gait problem, joint swelling and myalgias.  Skin: Negative for color change, pallor, rash and wound.  Neurological: Positive for weakness. Negative for dizziness, tremors and light-headedness.  Hematological: Negative for adenopathy. Does not bruise/bleed easily.  Psychiatric/Behavioral: Negative  for agitation, behavioral problems, confusion, decreased concentration, dysphoric mood and sleep disturbance.       Objective:   Physical Exam Vitals signs reviewed.  Constitutional:      General: She is not in acute distress.    Appearance: Normal appearance. She is well-developed. She is not ill-appearing or diaphoretic.  HENT:     Head: Normocephalic and atraumatic.     Right Ear: Hearing and external ear normal.     Left Ear: Hearing and external ear normal.     Nose: No nasal deformity or rhinorrhea.  Eyes:     General: No scleral icterus.    Conjunctiva/sclera: Conjunctivae normal.     Right eye: Right conjunctiva is not injected.     Left eye: Left conjunctiva is not injected.  Neck:     Musculoskeletal: Normal range of motion and neck supple. Muscular tenderness present.     Vascular: No JVD.  Cardiovascular:     Rate and Rhythm: Normal rate and regular rhythm.     Heart sounds: S1 normal and S2 normal.  Pulmonary:     Breath sounds: No wheezing.  Abdominal:     General: Bowel sounds are normal. There is no distension.     Palpations: Abdomen is soft.     Tenderness: There is no abdominal tenderness.  Musculoskeletal: Normal range of motion.     Right shoulder: Normal.     Left shoulder: Normal.     Right hip: Normal.     Left hip: Normal.     Right knee: Normal.     Left knee: Normal.  Lymphadenopathy:     Head:     Right side of head: No submandibular, preauricular or posterior auricular adenopathy.     Left side of head: No submandibular, preauricular or posterior auricular adenopathy.     Cervical: No cervical adenopathy.     Right cervical: No superficial or deep cervical adenopathy.    Left cervical: No superficial or deep cervical adenopathy.  Skin:    General: Skin is warm and dry.     Coloration: Skin is not pale.     Findings: No abrasion, bruising, ecchymosis, erythema, lesion or rash.     Nails: There is no clubbing.   Neurological:     Mental  Status: She is alert and oriented to person, place, and time.     Sensory: No sensory deficit.     Coordination: Coordination normal.     Gait: Gait normal.  Psychiatric:        Attention and Perception: She is attentive.        Speech: Speech normal.        Behavior: Behavior normal. Behavior is cooperative.        Thought Content: Thought content normal.        Judgment: Judgment normal.    Her shoulders are not tender to palpation has some tenderness in her lower spine the paravertebral area       Assessment & Plan:   Lumbar disc infection: I am bothered that her symptoms not improved and I am also bothered by her new 6 foot problems with neck pain and numbness and want to image the cervical spine.  In the interim I am extending antibiotics to the end of the month.  Neck pain with arm numbness: Paramount that we exclude infection in the C-spine and I have ordered MRI with and without contrast of her cervical spine.  Dental caries with infection: Is really critical that these teeth be taken care of I hope that somehow a means could be found where this can be paid for and the source of her infection can be taken care of.  History of intravenous drug use: Hopefully she is not going to relapse in the context of having a PICC line.  I spent greater than 25 minutes with the patient including greater than 50% of time in face to face counsel of the patient the management of her lumbar disc infection the need to work-up her neck pain and arm numbness her opiate addiction her history of intravenous drug use her dental infection and in [facility] of [facility].

## 2018-06-26 NOTE — Telephone Encounter (Signed)
Per verbal from [facility] called [facility] to have the IV antibiotic extended until 07/24/18. Order given to the pharmacy staff and verified.  Patient aware of the change.

## 2018-06-27 ENCOUNTER — Ambulatory Visit
Admission: RE | Admit: 2018-06-27 | Discharge: 2018-06-27 | Disposition: A | Discharge status: Home / Self Care | Payer: Medicare Other | Source: Ambulatory Visit | Attending: Infectious Disease | Admitting: Infectious Disease

## 2018-06-27 DIAGNOSIS — M542 Cervicalgia: Secondary | ICD-10-CM

## 2018-06-27 DIAGNOSIS — K047 Periapical abscess without sinus: Secondary | ICD-10-CM

## 2018-06-27 DIAGNOSIS — R200 Anesthesia of skin: Secondary | ICD-10-CM

## 2018-06-27 DIAGNOSIS — M4646 Discitis, unspecified, lumbar region: Secondary | ICD-10-CM

## 2018-06-27 LAB — CULTURE, BLOOD (ROUTINE X 2)
CULTURE: NO GROWTH
Culture: NO GROWTH

## 2018-06-27 MED ORDER — GADOBENATE DIMEGLUMINE 529 MG/ML IV SOLN
17.0000 mL | Freq: Once | INTRAVENOUS | Status: AC | PRN
  Administered 2018-06-27: 17 mL via INTRAVENOUS

## 2018-07-19 ENCOUNTER — Telehealth: Payer: Self-pay | Admitting: *Deleted

## 2018-07-19 NOTE — Telephone Encounter (Signed)
Coretta from [facility] called to report that the patient nurse has not been able to draw labs for 2 weeks. Per her insurance she has to go to ED for clot buster and was asked to do so last week and she did not go. She was asked to go this week and did not either. She has a follow up here Tuesday 07/23/18 but she will not have any labs prior to visit. Advised will let the provider know.

## 2018-07-19 NOTE — Telephone Encounter (Signed)
Lets just haver line pulled

## 2018-07-19 NOTE — Telephone Encounter (Signed)
Would you prefer to pull PICC line at visit 3/31 or by home health agency prior to visit? Andree Coss, RN

## 2018-07-23 ENCOUNTER — Ambulatory Visit: Payer: Medicare Other | Admitting: Infectious Disease

## 2018-07-23 NOTE — Telephone Encounter (Signed)
Would to have home health pulled to pick she is also already late to her visit this morning

## 2018-07-23 NOTE — Telephone Encounter (Signed)
Perfect

## 2018-07-23 NOTE — Telephone Encounter (Signed)
Relayed verbal order per Dr Daiva Eves to [facility] at [facility]. Repeated and verified, will be pulled after last dose 4/1.

## 2018-08-19 ENCOUNTER — Other Ambulatory Visit: Payer: Self-pay

## 2018-08-19 ENCOUNTER — Ambulatory Visit (INDEPENDENT_AMBULATORY_CARE_PROVIDER_SITE_OTHER): Payer: Medicare Other | Admitting: Infectious Disease

## 2018-08-19 ENCOUNTER — Encounter: Payer: Self-pay | Admitting: Infectious Disease

## 2018-08-19 DIAGNOSIS — Z720 Tobacco use: Secondary | ICD-10-CM | POA: Diagnosis not present

## 2018-08-19 DIAGNOSIS — F1121 Opioid dependence, in remission: Secondary | ICD-10-CM

## 2018-08-19 DIAGNOSIS — M4646 Discitis, unspecified, lumbar region: Secondary | ICD-10-CM

## 2018-08-19 DIAGNOSIS — R200 Anesthesia of skin: Secondary | ICD-10-CM

## 2018-08-19 DIAGNOSIS — F418 Other specified anxiety disorders: Secondary | ICD-10-CM

## 2018-08-19 DIAGNOSIS — R202 Paresthesia of skin: Secondary | ICD-10-CM

## 2018-08-19 HISTORY — DX: Paresthesia of skin: R20.0

## 2018-08-19 NOTE — Progress Notes (Signed)
Virtual Visit via Telephone Note  I connected with Mary Ewing on 08/19/18 at  2:30 PM EDT by telephone and verified that I am speaking with the correct person using two identifiers.   I discussed the limitations, risks, security and privacy concerns of performing an evaluation and management service by telephone and the availability of in person appointments. I also discussed with the patient that there may be a patient responsible charge related to this service. The patient expressed understanding and agreed to proceed.   History of Present Illness:   48 year old female with previous history of opioid use disorder/heroin use (clean for 1 year) admitted with worsening back pain and found to have a spinal abscess. IR aspiration with no organisms on gram stain and no growth on cultures. Concern source of the infection may from her poor dentition. She refused IR guided 2nd aspirate  And was starteed on IV ceftriaxone and oral metronidazole with planned 6 week course  Along the way she was readmitted with nausea and vomiting and the metronidazole was stopped.  She see a dentist but found out that the bill will be $1600 to take care of the teeth that are likely the cause of her infection.  Since then she was also readmitted with worsening back pain   Pete MRI done in late February showed:  1. Persistent inflammatory changes surrounding the right L3-4 facet with infiltration of the adjacent epidural fat. Relatively similar 3.0 cm fluid collection within the posterior right paraspinous soft tissues. Previously seen 1 cm fluid collection at the dorsal epidural space not as well seen on today's motion degraded exam. 2. Similar mild right lateral recess stenosis at L3-4, with mildly worsened mild to moderate right L3 foraminal narrowing.   Ells me that the back pain she had before is not improved much and in fact now she has pain in the left side to that radiates down the left side and she  has sensation of her legs getting weaker and is concerned that her legs may give out.  Also asked me if I could prescribe pain medications or whether she should be asking Dr. Dutch Quint for this I directed her to get with Dr. Dutch Quint.  Finally she also is complaining of neck pain with bilateral numbness in her arm after she sleeps at night.  The pain during the day continues in the neck but the numbness comes on at night when she sleeps.  Of note her C-spine was not imaged during her last hospitalization  After my visit with her we did obtain an MRI of the cervical spine which only showed a little bit of spondylosis but no evidence of infection.  Since then she is developed new right hand numbness and it feels like it is going to sleep "all of the time."  Is not lost strength in this hand or the other one.  She is not having fevers nausea malaise.    Her lower back pain is much improved  She did suffer from another abscess in right upper teeth after completing intravenous antibiotics. She got augmentin for this improvement but still needs her teeth removed.  She is trying to save up money to have her teeth removed.  He states that she only takes oxycodone occasionally as prescribed by Dr. Dutch Quint but is not using this recreational nor any other recreational drugs.  He currently lives in home with brother and then another brother who is 12 years old.  The 66 year old apparently is  still frequenting his friend's house which is a bit disturbing in terms of the risk of spreading novel coronavirus 2019.  Patient herself is trying to shelter in place as best she can.  Review of systems as above in HPI otherwise 12 point review of systems negative    Observations/Objective:  Neck pain now with right hand numbness: I discussed this with Dr. Dutch QuintPoole who will be evaluating her in about a week's time.  He feels there is no urgency to imaging at this point in time  Lumbar infection involving facet  joints: This seems clinically to be cured  History of intravenous drug use: She claims she is in remission  Abscess and poor dentition: It would be very helpful if she can indeed have her teeth removed  Assessment and Plan:  See above:  Neck pain: Will be seen by Dr. Dutch QuintPoole next week who will reassess  Lumbar infection with facet joint involvement: This is much better clinically  Poor dentition with recurrent dental abscesses and risk for seeding of her spine and other sites.  She will be able to have teeth removed and be fitted for dentures  IV drug use: Hopefully she truly is avoiding this.  Tobacco use: She is vaping I encouraged her to stop doing this as well as any inhalational drugs are potentially going to put her at risk for more severe novel coronavirus infection though vaping I would manage to be less risky than conventional tobacco smoke as far as lung injury with the exception of acute lung injury that is been happening I states largely attributed most recently to vitamin E contamination illegal marijuana products  Follow Up Instructions:    I discussed the assessment and treatment plan with the patient. The patient was provided an opportunity to ask questions and all were answered. The patient agreed with the plan and demonstrated an understanding of the instructions.   The patient was advised to call back or seek an in-person evaluation if the symptoms worsen or if the condition fails to improve as anticipated.  I provided 20 minutes of non-face-to-face time during this encounter.   Acey Lavornelius Van Dam, MD

## 2018-09-18 ENCOUNTER — Ambulatory Visit: Payer: Medicare Other | Admitting: Infectious Disease

## 2018-11-12 ENCOUNTER — Other Ambulatory Visit: Payer: Self-pay

## 2018-11-12 DIAGNOSIS — Z20822 Contact with and (suspected) exposure to covid-19: Secondary | ICD-10-CM

## 2018-11-14 LAB — NOVEL CORONAVIRUS, NAA: SARS-CoV-2, NAA: NOT DETECTED

## 2019-01-15 ENCOUNTER — Other Ambulatory Visit: Payer: Self-pay

## 2019-01-15 ENCOUNTER — Emergency Department (HOSPITAL_COMMUNITY)
Admission: EM | Admit: 2019-01-15 | Discharge: 2019-01-15 | Disposition: A | Discharge status: Home / Self Care | Payer: Medicare Other | Attending: Emergency Medicine | Admitting: Emergency Medicine

## 2019-01-15 ENCOUNTER — Encounter (HOSPITAL_COMMUNITY): Payer: Self-pay

## 2019-01-15 ENCOUNTER — Emergency Department (HOSPITAL_COMMUNITY): Payer: Medicare Other

## 2019-01-15 DIAGNOSIS — Y92410 Unspecified street and highway as the place of occurrence of the external cause: Secondary | ICD-10-CM | POA: Diagnosis not present

## 2019-01-15 DIAGNOSIS — S91021A Laceration with foreign body, right ankle, initial encounter: Secondary | ICD-10-CM | POA: Insufficient documentation

## 2019-01-15 DIAGNOSIS — M25512 Pain in left shoulder: Secondary | ICD-10-CM | POA: Diagnosis not present

## 2019-01-15 DIAGNOSIS — M549 Dorsalgia, unspecified: Secondary | ICD-10-CM | POA: Insufficient documentation

## 2019-01-15 DIAGNOSIS — D649 Anemia, unspecified: Secondary | ICD-10-CM | POA: Insufficient documentation

## 2019-01-15 DIAGNOSIS — Z79899 Other long term (current) drug therapy: Secondary | ICD-10-CM | POA: Diagnosis not present

## 2019-01-15 DIAGNOSIS — Y939 Activity, unspecified: Secondary | ICD-10-CM | POA: Insufficient documentation

## 2019-01-15 DIAGNOSIS — Y999 Unspecified external cause status: Secondary | ICD-10-CM | POA: Insufficient documentation

## 2019-01-15 DIAGNOSIS — E785 Hyperlipidemia, unspecified: Secondary | ICD-10-CM | POA: Insufficient documentation

## 2019-01-15 DIAGNOSIS — H748X2 Other specified disorders of left middle ear and mastoid: Secondary | ICD-10-CM | POA: Diagnosis not present

## 2019-01-15 DIAGNOSIS — K8020 Calculus of gallbladder without cholecystitis without obstruction: Secondary | ICD-10-CM | POA: Insufficient documentation

## 2019-01-15 DIAGNOSIS — Z882 Allergy status to sulfonamides status: Secondary | ICD-10-CM | POA: Insufficient documentation

## 2019-01-15 DIAGNOSIS — Z23 Encounter for immunization: Secondary | ICD-10-CM | POA: Diagnosis not present

## 2019-01-15 DIAGNOSIS — F17210 Nicotine dependence, cigarettes, uncomplicated: Secondary | ICD-10-CM | POA: Insufficient documentation

## 2019-01-15 DIAGNOSIS — F329 Major depressive disorder, single episode, unspecified: Secondary | ICD-10-CM | POA: Diagnosis not present

## 2019-01-15 DIAGNOSIS — I10 Essential (primary) hypertension: Secondary | ICD-10-CM | POA: Insufficient documentation

## 2019-01-15 DIAGNOSIS — S29001A Unspecified injury of muscle and tendon of front wall of thorax, initial encounter: Secondary | ICD-10-CM | POA: Diagnosis present

## 2019-01-15 DIAGNOSIS — M6282 Rhabdomyolysis: Secondary | ICD-10-CM | POA: Insufficient documentation

## 2019-01-15 DIAGNOSIS — F17290 Nicotine dependence, other tobacco product, uncomplicated: Secondary | ICD-10-CM | POA: Diagnosis not present

## 2019-01-15 DIAGNOSIS — S2221XA Fracture of manubrium, initial encounter for closed fracture: Secondary | ICD-10-CM

## 2019-01-15 DIAGNOSIS — Z8249 Family history of ischemic heart disease and other diseases of the circulatory system: Secondary | ICD-10-CM | POA: Insufficient documentation

## 2019-01-15 LAB — CBC
HCT: 42.9 % (ref 36.0–46.0)
Hemoglobin: 14.3 g/dL (ref 12.0–15.0)
MCH: 29.2 pg (ref 26.0–34.0)
MCHC: 33.3 g/dL (ref 30.0–36.0)
MCV: 87.6 fL (ref 80.0–100.0)
Platelets: 411 10*3/uL — ABNORMAL HIGH (ref 150–400)
RBC: 4.9 MIL/uL (ref 3.87–5.11)
RDW: 13 % (ref 11.5–15.5)
WBC: 20.7 10*3/uL — ABNORMAL HIGH (ref 4.0–10.5)
nRBC: 0 % (ref 0.0–0.2)

## 2019-01-15 LAB — COMPREHENSIVE METABOLIC PANEL
ALT: 17 U/L (ref 0–44)
AST: 27 U/L (ref 15–41)
Albumin: 4.9 g/dL (ref 3.5–5.0)
Alkaline Phosphatase: 101 U/L (ref 38–126)
Anion gap: 14 (ref 5–15)
BUN: 10 mg/dL (ref 6–20)
CO2: 21 mmol/L — ABNORMAL LOW (ref 22–32)
Calcium: 9.8 mg/dL (ref 8.9–10.3)
Chloride: 105 mmol/L (ref 98–111)
Creatinine, Ser: 1.14 mg/dL — ABNORMAL HIGH (ref 0.44–1.00)
GFR calc Af Amer: >60 mL/min (ref >60)
GFR calc non Af Amer: 57 mL/min — ABNORMAL LOW (ref >60)
Glucose, Bld: 124 mg/dL — ABNORMAL HIGH (ref 70–99)
Potassium: 3.5 mmol/L (ref 3.5–5.1)
Sodium: 140 mmol/L (ref 135–145)
Total Bilirubin: 0.5 mg/dL (ref 0.3–1.2)
Total Protein: 8.7 g/dL — ABNORMAL HIGH (ref 6.5–8.1)

## 2019-01-15 LAB — SAMPLE TO BLOOD BANK

## 2019-01-15 LAB — URINALYSIS, ROUTINE W REFLEX MICROSCOPIC
Bilirubin Urine: NEGATIVE
Glucose, UA: NEGATIVE mg/dL
Hgb urine dipstick: NEGATIVE
Ketones, ur: NEGATIVE mg/dL
Leukocytes,Ua: NEGATIVE
Nitrite: NEGATIVE
Protein, ur: NEGATIVE mg/dL
Specific Gravity, Urine: >1.046 — ABNORMAL HIGH (ref 1.005–1.030)
pH: 5 (ref 5.0–8.0)

## 2019-01-15 LAB — TROPONIN I (HIGH SENSITIVITY)
Troponin I (High Sensitivity): 5 ng/L (ref <18)
Troponin I (High Sensitivity): 6 ng/L (ref <18)

## 2019-01-15 LAB — PROTIME-INR
INR: 1.1 (ref 0.8–1.2)
Prothrombin Time: 13.7 seconds (ref 11.4–15.2)

## 2019-01-15 LAB — LACTIC ACID, PLASMA: Lactic Acid, Venous: 1.9 mmol/L (ref 0.5–1.9)

## 2019-01-15 LAB — I-STAT CHEM 8, ED
BUN: 11 mg/dL (ref 6–20)
Calcium, Ion: 1.21 mmol/L (ref 1.15–1.40)
Chloride: 107 mmol/L (ref 98–111)
Creatinine, Ser: 1 mg/dL (ref 0.44–1.00)
Glucose, Bld: 125 mg/dL — ABNORMAL HIGH (ref 70–99)
HCT: 45 % (ref 36.0–46.0)
Hemoglobin: 15.3 g/dL — ABNORMAL HIGH (ref 12.0–15.0)
Potassium: 3.5 mmol/L (ref 3.5–5.1)
Sodium: 143 mmol/L (ref 135–145)
TCO2: 22 mmol/L (ref 22–32)

## 2019-01-15 LAB — CDS SEROLOGY

## 2019-01-15 LAB — I-STAT BETA HCG BLOOD, ED (MC, WL, AP ONLY): I-stat hCG, quantitative: <5 m[IU]/mL (ref <5)

## 2019-01-15 LAB — ETHANOL: Alcohol, Ethyl (B): <10 mg/dL (ref <10)

## 2019-01-15 MED ORDER — HYDROMORPHONE HCL 1 MG/ML IJ SOLN
1.0000 mg | Freq: Once | INTRAMUSCULAR | Status: AC
  Administered 2019-01-15: 1 mg via INTRAVENOUS
  Filled 2019-01-15: qty 1

## 2019-01-15 MED ORDER — CYCLOBENZAPRINE HCL 5 MG PO TABS: 5.0000 mg | ORAL_TABLET | Freq: Two times a day (BID) | ORAL | 0 refills | Status: DC | PRN

## 2019-01-15 MED ORDER — TETANUS-DIPHTH-ACELL PERTUSSIS 5-2.5-18.5 LF-MCG/0.5 IM SUSP
0.5000 mL | Freq: Once | INTRAMUSCULAR | Status: AC
  Administered 2019-01-15: 18:00:00 0.5 mL via INTRAMUSCULAR
  Filled 2019-01-15: qty 0.5

## 2019-01-15 MED ORDER — ACETAMINOPHEN 325 MG PO TABS
650.0000 mg | ORAL_TABLET | Freq: Once | ORAL | Status: AC
  Administered 2019-01-15: 21:00:00 650 mg via ORAL
  Filled 2019-01-15: qty 2

## 2019-01-15 MED ORDER — IOHEXOL 300 MG/ML  SOLN
100.0000 mL | Freq: Once | INTRAMUSCULAR | Status: AC | PRN
  Administered 2019-01-15: 20:00:00 100 mL via INTRAVENOUS

## 2019-01-15 MED ORDER — SODIUM CHLORIDE 0.9 % IV BOLUS
1000.0000 mL | Freq: Once | INTRAVENOUS | Status: AC
  Administered 2019-01-15: 1000 mL via INTRAVENOUS

## 2019-01-15 MED ORDER — LIDOCAINE HCL (PF) 1 % IJ SOLN
10.0000 mL | Freq: Once | INTRAMUSCULAR | Status: AC
  Administered 2019-01-15: 10 mL

## 2019-01-15 MED ORDER — MORPHINE SULFATE (PF) 4 MG/ML IV SOLN
6.0000 mg | Freq: Once | INTRAVENOUS | Status: AC
  Administered 2019-01-15: 6 mg via INTRAVENOUS
  Filled 2019-01-15: qty 2
  Filled 2019-01-15: qty 1

## 2019-01-15 MED ORDER — OXYCODONE-ACETAMINOPHEN 5-325 MG PO TABS: 1.0000 | ORAL_TABLET | Freq: Four times a day (QID) | ORAL | 0 refills | Status: DC | PRN

## 2019-01-15 NOTE — ED Notes (Signed)
Pt reminded of the need for urine. Pt provided necessary supplies.
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
## 2019-01-15 NOTE — Discharge Instructions (Signed)
Please read attached information. If you experience any new or worsening signs or symptoms please return to the emergency room for evaluation. Please follow-up with your primary care provider or specialist as discussed. Please use medication prescribed only as directed and discontinue taking if you have any concerning signs or symptoms.   °

## 2019-01-15 NOTE — ED Notes (Signed)
Pt removed c-collar.
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
## 2019-01-15 NOTE — ED Notes (Signed)
Pt remains at CT

## 2019-01-15 NOTE — ED Notes (Signed)
X-ray at bedside

## 2019-01-15 NOTE — ED Notes (Signed)
Pyxis issue with morphine. Contacted CN and AD to help resolve. Two separate drawers contained 4 mg morphine vials. I pulled one, when I needed two for 6mg  dose. Unable to resolve issue since a different drawer opened in the same Pyxis. [place], AD fixed discrepancy and wasted .5 ml of morphine left from second vial. Pt was given 6 mg of morphine total as ordered.
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
## 2019-01-15 NOTE — ED Notes (Signed)
Patient verbalizes understanding of discharge instructions. Opportunity for questioning and answers were provided. Armband removed by staff, pt discharged from ED.  

## 2019-01-15 NOTE — ED Notes (Signed)
Pt placed on 2L O2 due to O2 sats dropping to 88%. Cristie Hem, PA aware

## 2019-01-19 ENCOUNTER — Emergency Department (HOSPITAL_COMMUNITY)
Admission: EM | Admit: 2019-01-19 | Discharge: 2019-01-19 | Disposition: A | Discharge status: Home / Self Care | Payer: Medicare Other | Attending: Emergency Medicine | Admitting: Emergency Medicine

## 2019-01-19 ENCOUNTER — Emergency Department (HOSPITAL_COMMUNITY): Payer: Medicare Other

## 2019-01-19 ENCOUNTER — Other Ambulatory Visit: Payer: Self-pay

## 2019-01-19 ENCOUNTER — Encounter (HOSPITAL_COMMUNITY): Payer: Self-pay

## 2019-01-19 DIAGNOSIS — Z79899 Other long term (current) drug therapy: Secondary | ICD-10-CM | POA: Diagnosis not present

## 2019-01-19 DIAGNOSIS — Z7982 Long term (current) use of aspirin: Secondary | ICD-10-CM | POA: Diagnosis not present

## 2019-01-19 DIAGNOSIS — R0789 Other chest pain: Secondary | ICD-10-CM | POA: Diagnosis not present

## 2019-01-19 DIAGNOSIS — I10 Essential (primary) hypertension: Secondary | ICD-10-CM | POA: Diagnosis not present

## 2019-01-19 DIAGNOSIS — J45909 Unspecified asthma, uncomplicated: Secondary | ICD-10-CM | POA: Diagnosis not present

## 2019-01-19 DIAGNOSIS — F17210 Nicotine dependence, cigarettes, uncomplicated: Secondary | ICD-10-CM | POA: Diagnosis not present

## 2019-01-19 DIAGNOSIS — R079 Chest pain, unspecified: Secondary | ICD-10-CM | POA: Diagnosis present

## 2019-01-19 MED ORDER — HYDROMORPHONE HCL 1 MG/ML IJ SOLN
1.0000 mg | Freq: Once | INTRAMUSCULAR | Status: AC
  Administered 2019-01-19: 1 mg via INTRAMUSCULAR
  Filled 2019-01-19: qty 1

## 2019-01-19 MED ORDER — HYDROCODONE-ACETAMINOPHEN 5-325 MG PO TABS: 1.0000 | ORAL_TABLET | Freq: Three times a day (TID) | ORAL | 0 refills | Status: DC | PRN

## 2019-01-19 MED ORDER — LORAZEPAM 1 MG PO TABS
1.0000 mg | ORAL_TABLET | Freq: Once | ORAL | Status: AC
  Administered 2019-01-19: 1 mg via ORAL
  Filled 2019-01-19: qty 1

## 2019-01-19 MED ORDER — OXYCODONE-ACETAMINOPHEN 5-325 MG PO TABS
1.0000 | ORAL_TABLET | Freq: Once | ORAL | Status: AC
  Administered 2019-01-19: 15:00:00 1 via ORAL
  Filled 2019-01-19: qty 1

## 2019-01-19 MED ORDER — KETOROLAC TROMETHAMINE 60 MG/2ML IM SOLN
15.0000 mg | Freq: Once | INTRAMUSCULAR | Status: AC
  Administered 2019-01-19: 15 mg via INTRAMUSCULAR
  Filled 2019-01-19: qty 2

## 2019-01-19 NOTE — ED Notes (Signed)
Patient verbalizes understanding of discharge instructions. Opportunity for questioning and answers were provided. Armband removed by staff, pt discharged from ED.  

## 2019-01-19 NOTE — Discharge Instructions (Addendum)
You are going to be sore for weeks. The first week is going to be the worst and unfortunately there just isn't a quick solution. If you develop new symptoms, difficulty breathing or anything concerning to you then I want you rechecked. Otherwise follow-up with your PCP.

## 2019-01-19 NOTE — ED Notes (Signed)
Patient transported to X-ray 
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
## 2019-01-19 NOTE — ED Notes (Signed)
Patient ambulated to bathroom.
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
## 2019-02-20 ENCOUNTER — Other Ambulatory Visit: Payer: Self-pay

## 2019-02-20 ENCOUNTER — Emergency Department (HOSPITAL_COMMUNITY): Payer: Medicare Other

## 2019-02-20 ENCOUNTER — Emergency Department (HOSPITAL_COMMUNITY)
Admission: EM | Admit: 2019-02-20 | Discharge: 2019-02-21 | Disposition: A | Discharge status: Home / Self Care | Payer: Medicare Other | Attending: Emergency Medicine | Admitting: Emergency Medicine

## 2019-02-20 DIAGNOSIS — S51812A Laceration without foreign body of left forearm, initial encounter: Secondary | ICD-10-CM | POA: Insufficient documentation

## 2019-02-20 DIAGNOSIS — F1910 Other psychoactive substance abuse, uncomplicated: Secondary | ICD-10-CM | POA: Diagnosis not present

## 2019-02-20 DIAGNOSIS — Y999 Unspecified external cause status: Secondary | ICD-10-CM | POA: Diagnosis not present

## 2019-02-20 DIAGNOSIS — Z79899 Other long term (current) drug therapy: Secondary | ICD-10-CM | POA: Diagnosis not present

## 2019-02-20 DIAGNOSIS — Z20828 Contact with and (suspected) exposure to other viral communicable diseases: Secondary | ICD-10-CM | POA: Diagnosis not present

## 2019-02-20 DIAGNOSIS — I10 Essential (primary) hypertension: Secondary | ICD-10-CM | POA: Diagnosis not present

## 2019-02-20 DIAGNOSIS — IMO0002 Reserved for concepts with insufficient information to code with codable children: Secondary | ICD-10-CM

## 2019-02-20 DIAGNOSIS — Z23 Encounter for immunization: Secondary | ICD-10-CM | POA: Diagnosis not present

## 2019-02-20 DIAGNOSIS — S41012A Laceration without foreign body of left shoulder, initial encounter: Secondary | ICD-10-CM | POA: Insufficient documentation

## 2019-02-20 DIAGNOSIS — R45851 Suicidal ideations: Secondary | ICD-10-CM | POA: Insufficient documentation

## 2019-02-20 DIAGNOSIS — Y92009 Unspecified place in unspecified non-institutional (private) residence as the place of occurrence of the external cause: Secondary | ICD-10-CM | POA: Diagnosis not present

## 2019-02-20 DIAGNOSIS — X789XXA Intentional self-harm by unspecified sharp object, initial encounter: Secondary | ICD-10-CM | POA: Diagnosis not present

## 2019-02-20 DIAGNOSIS — Z046 Encounter for general psychiatric examination, requested by authority: Secondary | ICD-10-CM | POA: Diagnosis present

## 2019-02-20 DIAGNOSIS — Z7289 Other problems related to lifestyle: Secondary | ICD-10-CM

## 2019-02-20 DIAGNOSIS — F17210 Nicotine dependence, cigarettes, uncomplicated: Secondary | ICD-10-CM | POA: Diagnosis not present

## 2019-02-20 DIAGNOSIS — Y9389 Activity, other specified: Secondary | ICD-10-CM | POA: Diagnosis not present

## 2019-02-20 DIAGNOSIS — S51811A Laceration without foreign body of right forearm, initial encounter: Secondary | ICD-10-CM | POA: Diagnosis not present

## 2019-02-20 DIAGNOSIS — F332 Major depressive disorder, recurrent severe without psychotic features: Secondary | ICD-10-CM | POA: Diagnosis not present

## 2019-02-20 DIAGNOSIS — T07XXXA Unspecified multiple injuries, initial encounter: Secondary | ICD-10-CM

## 2019-02-20 LAB — CBC WITH DIFFERENTIAL/PLATELET
Abs Immature Granulocytes: 0.07 10*3/uL (ref 0.00–0.07)
Basophils Absolute: 0 10*3/uL (ref 0.0–0.1)
Basophils Relative: 0 %
Eosinophils Absolute: 0.1 10*3/uL (ref 0.0–0.5)
Eosinophils Relative: 1 %
HCT: 33.9 % — ABNORMAL LOW (ref 36.0–46.0)
Hemoglobin: 11 g/dL — ABNORMAL LOW (ref 12.0–15.0)
Immature Granulocytes: 1 %
Lymphocytes Relative: 19 %
Lymphs Abs: 2 10*3/uL (ref 0.7–4.0)
MCH: 29.6 pg (ref 26.0–34.0)
MCHC: 32.4 g/dL (ref 30.0–36.0)
MCV: 91.1 fL (ref 80.0–100.0)
Monocytes Absolute: 1 10*3/uL (ref 0.1–1.0)
Monocytes Relative: 9 %
Neutro Abs: 7.5 10*3/uL (ref 1.7–7.7)
Neutrophils Relative %: 70 %
Platelets: 276 10*3/uL (ref 150–400)
RBC: 3.72 MIL/uL — ABNORMAL LOW (ref 3.87–5.11)
RDW: 13.3 % (ref 11.5–15.5)
WBC: 10.6 10*3/uL — ABNORMAL HIGH (ref 4.0–10.5)
nRBC: 0 % (ref 0.0–0.2)

## 2019-02-20 LAB — COMPREHENSIVE METABOLIC PANEL
ALT: 20 U/L (ref 0–44)
AST: 27 U/L (ref 15–41)
Albumin: 4.2 g/dL (ref 3.5–5.0)
Alkaline Phosphatase: 101 U/L (ref 38–126)
Anion gap: 10 (ref 5–15)
BUN: 14 mg/dL (ref 6–20)
CO2: 26 mmol/L (ref 22–32)
Calcium: 9.2 mg/dL (ref 8.9–10.3)
Chloride: 102 mmol/L (ref 98–111)
Creatinine, Ser: 0.96 mg/dL (ref 0.44–1.00)
GFR calc Af Amer: >60 mL/min (ref >60)
GFR calc non Af Amer: >60 mL/min (ref >60)
Glucose, Bld: 112 mg/dL — ABNORMAL HIGH (ref 70–99)
Potassium: 4.2 mmol/L (ref 3.5–5.1)
Sodium: 138 mmol/L (ref 135–145)
Total Bilirubin: 0.3 mg/dL (ref 0.3–1.2)
Total Protein: 8.3 g/dL — ABNORMAL HIGH (ref 6.5–8.1)

## 2019-02-20 MED ORDER — TETANUS-DIPHTH-ACELL PERTUSSIS 5-2.5-18.5 LF-MCG/0.5 IM SUSP
0.5000 mL | Freq: Once | INTRAMUSCULAR | Status: AC
  Administered 2019-02-20: 0.5 mL via INTRAMUSCULAR
  Filled 2019-02-20: qty 0.5

## 2019-02-20 MED ORDER — SODIUM CHLORIDE 0.9 % IV BOLUS
1000.0000 mL | Freq: Once | INTRAVENOUS | Status: AC
  Administered 2019-02-20: 23:00:00 1000 mL via INTRAVENOUS

## 2019-02-20 NOTE — ED Triage Notes (Signed)
Per EMS, Pt is coming from home. Pts family called EMS for SI attempt. PD is currently working on [employer] paperwork. Pt has multiple lacerations across pts arms, neck, clavicle, and abd. Suspected heroin use, and possible percocet and xanax use. Pt made multiple statements that she was going to kill herself. Pt is alert to voice, does have some periods of apnea.

## 2019-02-20 NOTE — ED Triage Notes (Signed)
Pt was combative on scene and restrained for transportation.

## 2019-02-20 NOTE — ED Provider Notes (Signed)
[place] DEPT Provider Note   CSN: [id number] Arrival date & time: 02/20/19  2105     History   Chief Complaint Chief Complaint  Patient presents with  . Suicidal  . Laceration    LEVEL 5 CAVEAT 2/2 PSYCHIATRIC CONDITION  HPI Mary Ewing is a 48 y.o. female.     48 year old female with a history of depression with suicide attempt x3, dyslipidemia, anemia, substance abuse presents to the emergency department following a suicide attempt tonight.  IVC papers taken out by [employer].  Patient cut herself tonight to her bilateral arms and neck with an X-Acto knife with suicidal intent.  Suspected heroin abuse as well as use of Percocet and Xanax, but patient denies use of any illicit substances or prescription medications.  Further denies alcohol use.  Does endorse persistent SI.  Intermittently somnolent, but easily awoken to loud voice and sternal rubbing.  The history is provided by the patient and the police. No language interpreter was used.  Laceration   Past Medical History:  Diagnosis Date  . Acute renal failure ([place])   . Anxiety   . Arm numbness 06/25/2018  . Asthmatic bronchitis    history of  . Depression   . DJD (degenerative joint disease)   . GERD (gastroesophageal reflux disease)   . H/O: suicide attempt    x 3  . High cholesterol   . Iron deficiency anemia   . Numbness and tingling in right hand 08/19/2018  . Substance abuse [facility])     Patient Active Problem List   Diagnosis Date Noted  . Numbness and tingling in right hand 08/19/2018  . Arm numbness 06/25/2018  . Fever 06/22/2018  . Hypokalemia 06/22/2018  . Anemia 06/22/2018  . Substance abuse ([place]) 06/22/2018  . Discitis of lumbar region   . Facet joint disease of lumbosacral region 05/23/2018  . Essential hypertension 05/23/2018  . Marijuana abuse 05/23/2018  . Dental abscess 05/23/2018  . Chronic low back pain 03/30/2015  . Neck pain 02/04/2015  . Arthralgia of  hip 01/26/2015  . Elevated WBC count 11/11/2014  . 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 HLD (hyperlipidemia) 11/10/2014  . Menopause praecox 10/23/2014  . Opioid dependence in remission ([place]) 10/23/2014  . Recurrent major depressive disorder ([place]) 10/23/2014  . Pain in shoulder 10/23/2014  . Chronic cervical pain 10/23/2014  . Overdose 04/09/2014  . Elevated transaminase level 04/09/2014  . Rhabdomyolysis 04/09/2014  . PNA (pneumonia) 04/06/2011  . NECK PAIN, ACUTE 06/10/2009  . Tobacco abuse 05/20/2009  . Nausea alone 05/19/2009  . ABDOMINAL PAIN RIGHT LOWER QUADRANT 05/19/2009  . ABDOMINAL PAIN, HX OF 05/18/2009  . Extrinsic asthma 09/30/2008  . BACK PAIN 05/28/2008  . ABDOMINAL PAIN, LEFT UPPER QUADRANT 05/28/2008  . Depression with anxiety 03/24/2008  . SHOULDER PAIN, LEFT 03/24/2008  . HEADACHE 03/24/2008    Past Surgical History:  Procedure Laterality Date  . APPENDECTOMY  2004  . CESAREAN SECTION    . MOUTH SURGERY     teeth removed  . REFRACTIVE SURGERY    . right tympanoplasty  1999  . TONSILLECTOMY       OB History   No obstetric history on file.      Home Medications    Prior to Admission medications   Medication Sig Start Date End Date Taking? Authorizing Provider  acetaminophen (TYLENOL) 325 MG tablet Take 2 tablets (650 mg total) by mouth every 6 (six) hours as needed for mild pain (or Fever >/=  101). 05/29/18  Yes Rizwan, Ladell Heads, MD  Cholecalciferol (VITAMIN D3) 10 MCG (400 UNIT) CAPS Take 400 Units by mouth daily.   Yes [provider]  cyclobenzaprine (FLEXERIL) 5 MG tablet Take 1 tablet (5 mg total) by mouth 2 (two) times daily as needed for muscle spasms. 01/15/19  Yes Hedges, Tinnie Gens, PA-C  dimenhyDRINATE (DRAMAMINE) 50 MG tablet Take 50 mg by mouth every 6 (six) hours as needed for nausea.   Yes [provider]  famotidine (PEPCID) 20 MG tablet Take 20 mg by mouth daily.   Yes [provider]  FLUoxetine (PROZAC) 20 MG capsule Take  60 mg by mouth every morning.    Yes [provider]  gabapentin (NEURONTIN) 300 MG capsule Take 600 mg by mouth at bedtime.    Yes [provider]  Lactobacillus (ACIDOPHILUS) CAPS capsule Take 1 capsule by mouth 2 (two) times daily.   Yes [provider]  Melatonin 10 MG TABS Take 10 mg by mouth at bedtime.   Yes [provider]  Multiple Vitamins-Minerals (THERA-M) TABS Take 1 tablet by mouth daily.    Yes [provider]  nicotine (NICODERM CQ - DOSED IN MG/24 HOURS) 14 mg/24hr patch Place 1 patch (14 mg total) onto the skin daily. 05/30/18  Yes Calvert Cantor, MD  aspirin-acetaminophen-caffeine (EXCEDRIN MIGRAINE) [phone] MG tablet Take 2 tablets by mouth every 8 (eight) hours as needed for headache or migraine (or pain).     [provider]  atorvastatin (LIPITOR) 20 MG tablet Take 20 mg by mouth daily at 6 PM.  11/11/14 06/22/18  [provider]  HYDROcodone-acetaminophen (NORCO/VICODIN) 5-325 MG tablet Take 1 tablet by mouth every 8 (eight) hours as needed. Patient not taking: Reported on 02/21/2019 01/19/19   Raeford Razor, MD  oxyCODONE (OXY IR/ROXICODONE) 5 MG immediate release tablet Take 1 tablet (5 mg total) by mouth every 4 (four) hours as needed for severe pain or breakthrough pain. Patient not taking: Reported on 02/21/2019 06/23/18   Clydie Braun, MD  oxyCODONE-acetaminophen (PERCOCET/ROXICET) 5-325 MG tablet Take 1 tablet by mouth every 6 (six) hours as needed. Patient not taking: Reported on 02/21/2019 01/15/19   Hedges, Tinnie Gens, PA-C  QUEtiapine (SEROQUEL) 300 MG tablet Take 300 mg by mouth at bedtime.  04/23/18   [provider]    Family History Family History  Problem Relation Age of Onset  . Asthma Mother   . COPD Mother   . Anxiety disorder Mother   . Diabetes Mother   . Depression Mother   . Hypertension Mother     Social History Social History   Tobacco Use  . Smoking status: Current  Every Day Smoker    Packs/day: 1.00    Years: 24.00    Pack years: 24.00    Types: Cigarettes, E-cigarettes  . Smokeless tobacco: Never Used  . Tobacco comment: needs a patch  Substance Use Topics  . Alcohol use: Yes    Alcohol/week: 0.0 standard drinks    Comment: occ  . Drug use: Yes    Types: Cocaine, Marijuana    Comment: prescription drugs;last use of marijuana was this AM, denies cocaine      Allergies   Sulfonamide derivatives   Review of Systems Review of Systems  Unable to perform ROS: Psychiatric disorder     Physical Exam Updated Vital Signs BP 103/61 (BP Location: Left Arm)   Pulse 99   Temp 99 F (37.2 C) (Oral)   Resp 18  SpO2 95%   Physical Exam Vitals signs and nursing note reviewed.  Constitutional:      General: She is not in acute distress.    Appearance: She is well-developed. She is not diaphoretic.     Comments: Patient falls asleep when not stimulated, but easily awoken.  HENT:     Head: Normocephalic and atraumatic.  Eyes:     General: No scleral icterus.    Extraocular Movements: Extraocular movements intact.     Conjunctiva/sclera: Conjunctivae normal.     Pupils: Pupils are equal, round, and reactive to light.  Neck:     Musculoskeletal: Normal range of motion.  Cardiovascular:     Rate and Rhythm: Regular rhythm. Tachycardia present.     Pulses: Normal pulses.  Pulmonary:     Effort: Pulmonary effort is normal. No respiratory distress.     Comments: Respirations even and unlabored Musculoskeletal: Normal range of motion.  Skin:    General: Skin is warm and dry.     Coloration: Skin is not pale.     Findings: No erythema or rash.          Comments: Multiple lacerations to bilateral volar forearms. Most lacerations do not penetrate deeper than the epidermis, though there are two sites along lacerations of the LUE that do briefly extend to the dermis. Laceration through dermis also noted to L lower neck/upper chest. Bleeding  controlled to all sites with pressure.  No palpable, pulsatile bleeding.  Neurological:     Mental Status: She is alert and oriented to person, place, and time.     Coordination: Coordination normal.     Comments: Patient alert and oriented x4.  GCS 15.  Patient answers questions appropriately and follows commands moving all extremities spontaneously.  Psychiatric:        Behavior: Behavior normal.     Comments: +SI. Multiple lacerations sustained from suicide attempt with X-acto knife.      ED Treatments / Results  Labs (all labs ordered are listed, but only abnormal results are displayed) Labs Reviewed  CBC WITH DIFFERENTIAL/PLATELET - Abnormal; Notable for the following components:      Result Value   WBC 10.6 (*)    RBC 3.72 (*)    Hemoglobin 11.0 (*)    HCT 33.9 (*)    All other components within normal limits  COMPREHENSIVE METABOLIC PANEL - Abnormal; Notable for the following components:   Glucose, Bld 112 (*)    Total Protein 8.3 (*)    All other components within normal limits  RAPID URINE DRUG SCREEN, HOSP PERFORMED - Abnormal; Notable for the following components:   Opiates POSITIVE (*)    Cocaine POSITIVE (*)    Benzodiazepines POSITIVE (*)    All other components within normal limits  ACETAMINOPHEN LEVEL - Abnormal; Notable for the following components:   Acetaminophen (Tylenol), Serum <10 (*)    All other components within normal limits  SARS CORONAVIRUS 2 BY RT PCR (HOSPITAL ORDER, PERFORMED IN [place] [facility])  PREGNANCY, URINE  ETHANOL  SALICYLATE LEVEL    EKG None  Radiology Dg Chest Port 1 View  Result Date: 02/20/2019 CLINICAL DATA:  Self-inflicted neck and chest wounds EXAM: PORTABLE CHEST 1 VIEW COMPARISON:  01/19/2019 FINDINGS: Cardiac shadow is stable. The lungs are well aerated bilaterally. No focal infiltrate or sizable effusion is seen. No bony abnormality is noted. IMPRESSION: No active disease. Electronically Signed   By: Alcide CleverMark   Lukens M.D.   On: 02/20/2019 23:17  Procedures Procedures (including critical care time)  Medications Ordered in ED Medications  ibuprofen (ADVIL) tablet 600 mg (has no administration in time range)  ondansetron (ZOFRAN) tablet 4 mg (has no administration in time range)  nicotine (NICODERM CQ - dosed in mg/24 hours) patch 21 mg (has no administration in time range)  atorvastatin (LIPITOR) tablet 20 mg (has no administration in time range)  FLUoxetine (PROZAC) capsule 60 mg (has no administration in time range)  QUEtiapine (SEROQUEL) tablet 300 mg (has no administration in time range)  gabapentin (NEURONTIN) capsule 600 mg (600 mg Oral Given 02/21/19 0311)  sodium chloride 0.9 % bolus 1,000 mL (1,000 mLs Intravenous New Bag/Given (Non-Interop) 02/20/19 2316)  Tdap (BOOSTRIX) injection 0.5 mL (0.5 mLs Intramuscular Given 02/20/19 2339)  Thrombi-Pad 3"X3" pad 1 each (1 each Topical Given 02/21/19 0121)     Initial Impression / Assessment and Plan / ED Course  I have reviewed the triage vital signs and the nursing notes.  Pertinent labs & imaging results that were available during my care of the patient were reviewed by me and considered in my medical decision making (see chart for details).        48 year old female presents to the emergency department for psychiatric evaluation.  Attempted suicide tonight after cutting her arm and neck with an X-Acto knife.  Majority of lacerations are superficial; none requiring sutures for repair.  An x-ray was performed given location of laceration to the neck.  This shows no evidence of free air.  She has no crepitus on exam.  No palpable, pulsatile bleeding.  No concern for vascular injury.  Tetanus updated in the emergency department.  The patient has been medically cleared.  UDS positive for opiates, cocaine, benzodiazepines.  Tachycardia has improved with IV fluids.  Patient assessed by TTS who recommend inpatient psychiatric treatment.   Placement is currently pending.  Disposition to be determined by oncoming ED provider.   Final Clinical Impressions(s) / ED Diagnoses   Final diagnoses:  Suicidal ideation  Multiple lacerations  Self-inflicted injury  Polysubstance abuse [facility])    ED Discharge Orders    None       Antony Madura, PA-C 02/21/19 0553    Paula Libra, MD 02/21/19 [phone]

## 2019-02-20 NOTE — ED Notes (Signed)
Pt appears extremely lethargic. She continuously falls asleep during assessment. Pt denies suicide attempt or desire but has obvious signs of injury to wrist and neck.

## 2019-02-20 NOTE — ED Notes (Signed)
Purewick placed on pt. 

## 2019-02-21 DIAGNOSIS — S51811A Laceration without foreign body of right forearm, initial encounter: Secondary | ICD-10-CM | POA: Diagnosis not present

## 2019-02-21 LAB — SARS CORONAVIRUS 2 BY RT PCR (HOSPITAL ORDER, PERFORMED IN ~~LOC~~ HOSPITAL LAB): SARS Coronavirus 2: NEGATIVE

## 2019-02-21 LAB — RAPID URINE DRUG SCREEN, HOSP PERFORMED
Amphetamines: NOT DETECTED
Barbiturates: NOT DETECTED
Benzodiazepines: POSITIVE — AB
Cocaine: POSITIVE — AB
Opiates: POSITIVE — AB
Tetrahydrocannabinol: NOT DETECTED

## 2019-02-21 LAB — SALICYLATE LEVEL: Salicylate Lvl: <7 mg/dL (ref 2.8–30.0)

## 2019-02-21 LAB — ACETAMINOPHEN LEVEL: Acetaminophen (Tylenol), Serum: <10 ug/mL — ABNORMAL LOW (ref 10–30)

## 2019-02-21 LAB — PREGNANCY, URINE: Preg Test, Ur: NEGATIVE

## 2019-02-21 LAB — ETHANOL: Alcohol, Ethyl (B): <10 mg/dL (ref <10)

## 2019-02-21 MED ORDER — "THROMBI-PAD 3""X3"" EX PADS"
1.0000 | MEDICATED_PAD | Freq: Once | CUTANEOUS | Status: AC
  Administered 2019-02-21: 01:00:00 1 via TOPICAL
  Filled 2019-02-21: qty 1

## 2019-02-21 MED ORDER — ONDANSETRON HCL 4 MG PO TABS: 4.0000 mg | ORAL_TABLET | Freq: Three times a day (TID) | ORAL | Status: DC | PRN

## 2019-02-21 MED ORDER — FLUOXETINE HCL 20 MG PO CAPS
60.0000 mg | ORAL_CAPSULE | Freq: Every day | ORAL | Status: DC
  Administered 2019-02-21: 09:00:00 60 mg via ORAL
  Filled 2019-02-21: qty 3

## 2019-02-21 MED ORDER — QUETIAPINE FUMARATE 300 MG PO TABS
300.0000 mg | ORAL_TABLET | Freq: Every day | ORAL | Status: DC
  Filled 2019-02-21: qty 1

## 2019-02-21 MED ORDER — IBUPROFEN 200 MG PO TABS: 600.0000 mg | ORAL_TABLET | Freq: Three times a day (TID) | ORAL | Status: DC | PRN

## 2019-02-21 MED ORDER — NICOTINE 21 MG/24HR TD PT24
21.0000 mg | MEDICATED_PATCH | Freq: Every day | TRANSDERMAL | Status: DC
  Administered 2019-02-21: 21 mg via TRANSDERMAL
  Filled 2019-02-21: qty 1

## 2019-02-21 MED ORDER — GABAPENTIN 300 MG PO CAPS
600.0000 mg | ORAL_CAPSULE | Freq: Every day | ORAL | Status: DC
  Administered 2019-02-21: 600 mg via ORAL
  Filled 2019-02-21: qty 2

## 2019-02-21 MED ORDER — ATORVASTATIN CALCIUM 20 MG PO TABS
20.0000 mg | ORAL_TABLET | Freq: Every day | ORAL | Status: DC
  Filled 2019-02-21: qty 1

## 2019-02-21 NOTE — ED Notes (Signed)
Walked past patient's room and noticed pt was standing beside of the bed.  Questioned the pt about what she was doing and she said she wanted to call her mom. Pt was sat back on the bed and monitor cables reconnected. Pt's oxygen level was low so put the pt's nasal cannula back on.  Pt currently on the phone with her mother.  RN aware of pt being out of bed but now being back in bed and being monitored.

## 2019-02-21 NOTE — Discharge Instructions (Signed)
For your behavioral health needs you are advised to continue treatment with your regular outpatient provider. 

## 2019-02-21 NOTE — Progress Notes (Signed)
Received Mary Ewing from the main ED, alert and very restless. She was given multiple cups of fluids and later received a sandwich. She denied feeling suicidal at the present time. She remained restless until approximately 0530 hrs when she finally drifted off to sleep with frequent grunting sounds.

## 2019-02-21 NOTE — ED Notes (Signed)
Pt restless, walking around room at times. Making sounds.

## 2019-02-21 NOTE — ED Notes (Signed)
Pt  DCd off unit to home per MD. Pt alert, calm, cooperative, and s/s of distress. DC information given to and reviewed with pt, pt acknowledged understanding.  Belongings given to pt. Pt ambulatory off unit, escorted by nurse. Pt transported by family member.

## 2019-02-21 NOTE — Patient Outreach (Signed)
ED Peer Support Specialist Patient Intake (Complete at intake & 30-60 Day Follow-up)  Name: Mary Ewing  MRN: [id number]  Age: 48 y.o.   Date of Admission: 02/21/2019  Intake: Initial Comments:      Primary Reason Admitted: Suicidal ideation   Lab values: Alcohol/ETOH: Negative Positive UDS? Yes Amphetamines: No Barbiturates: No Benzodiazepines: Yes Cocaine: Yes Opiates: Yes Cannabinoids: No  Demographic information: Gender: Female Ethnicity: White Marital Status: Single Insurance Status: Marketing executive (Work Neurosurgeon, Physicist, medical, Social research officer, government.: Yes(SSI) Lives with: Parent Living situation: House/Apartment  Reported Patient History: Patient reported health conditions: None Patient aware of HIV and hepatitis status: No  In past year, has patient visited ED for any reason? Yes  Number of ED visits: 1  Reason(s) for visit: infection  In past year, has patient been hospitalized for any reason? No  Number of hospitalizations:    Reason(s) for hospitalization:    In past year, has patient been arrested? No  Number of arrests:    Reason(s) for arrest:    In past year, has patient been incarcerated? No  Number of incarcerations:    Reason(s) for incarceration:    In past year, has patient received medication-assisted treatment? No  In past year, patient received the following treatments: Group therapy  In past year, has patient received any harm reduction services? No  Did this include any of the following?    In past year, has patient received care from a mental health provider for diagnosis other than SUD? No  In past year, is this first time patient has overdosed? No  Number of past overdoses:    In past year, is this first time patient has been hospitalized for an overdose? No  Number of hospitalizations for overdose(s):    Is patient currently receiving treatment for a mental health diagnosis? No  Patient reports  experiencing difficulty participating in SUD treatment: No    Most important reason(s) for this difficulty?    Has patient received prior services for treatment? No  In past, patient has received services from following agencies:    Plan of Care:  Suggested follow up at these agencies/treatment centers: Other (comment)  Other information: CPSS met with Pt an was able to process with Pt about how she is doing and to try to better assist . CPSS asked Pt series of questions and was able to discuss what concerns that Pt was having and if there is anyway that CPSS would be able to assist Pt, with seeking assistance in the community. Pt stated that she does not have any substance issues an that she can stop what she is doing at anytime. Pt stated that she has a follow up appointment with [facility] in a few weeks. CPSS left contact number for Pt if she needs assistance in community also.   Aaron Edelman [place], CPSS  02/21/2019 12:41 PM

## 2019-02-21 NOTE — ED Notes (Signed)
Pt's clothing (her only belongings at arrival) were placed into a pt belonging bag and placed in "pt belongings room 1-4" cabinet.
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
## 2019-02-21 NOTE — ED Notes (Addendum)
Pt's belongings moved to locker 27 in [place]. Waiting for TTS consult to finish and will have pt dress out, be wanded and then escorted back to room 27.
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
## 2019-08-28 IMAGING — MR MR LUMBAR SPINE W/O CM
5 of 7 series · 28 of 48 positions shown · non-contrast
Comparison: Prior MRI from 05/27/2018

CLINICAL DATA: Initial evaluation for recurrent fever.

EXAM:
MRI LUMBAR SPINE WITHOUT CONTRAST
TECHNIQUE: Multiplanar, multisequence MR imaging of the lumbar spine was
performed. No intravenous contrast was administered.

[Series 5: T2 · sagittal · 4.0mm · 0.73mm/px · 5 of 15 slices shown (1 of 3)]
[im 1/15]
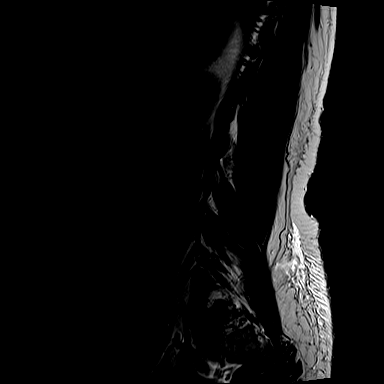
[im 4/15]
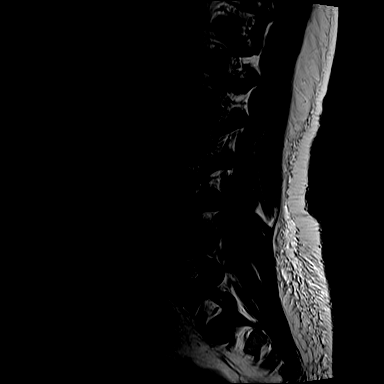
[im 8/15]
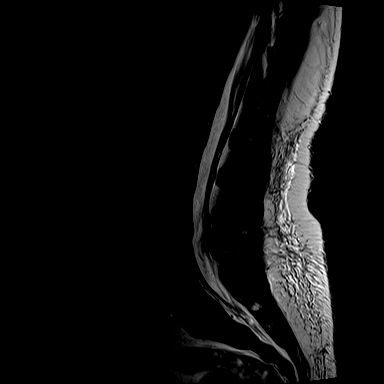
[im 11/15]
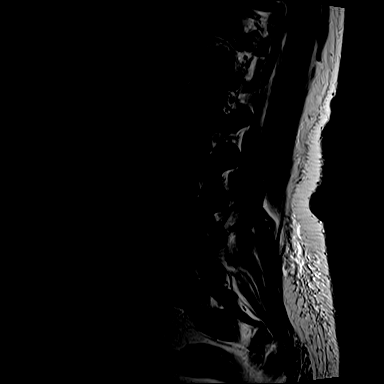
[im 15/15]
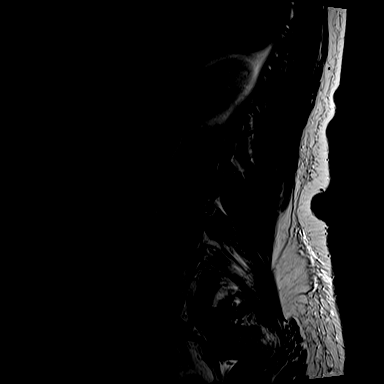

[Series 7: T1 · sagittal · 4.0mm · 0.88mm/px · 4 of 15 slices shown (1 of 2)]
[im 1/15]
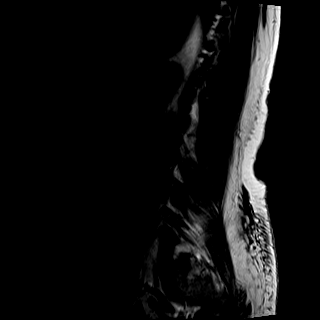
[im 5/15]
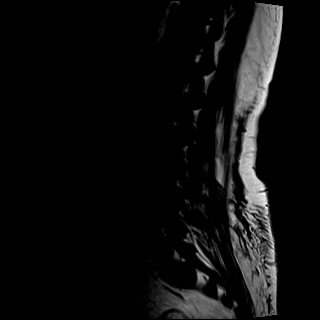
[im 10/15]
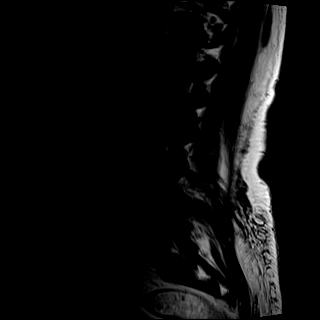
[im 15/15]
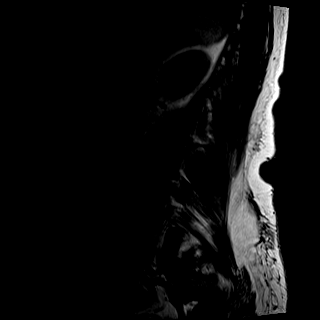

[Series 8: T2 · axial · 4.0mm · 0.57mm/px · z∈[-96,+111]mm · 8 of 35 slices shown (2 of 3)]
[im 1/35]
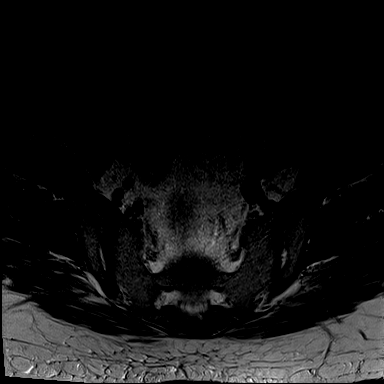
[im 4/35]
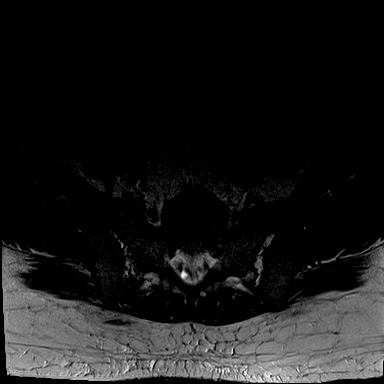
[im 12/35]
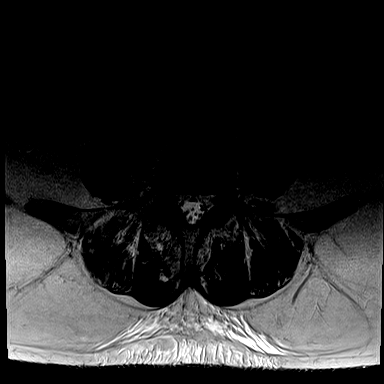
[im 16/35]
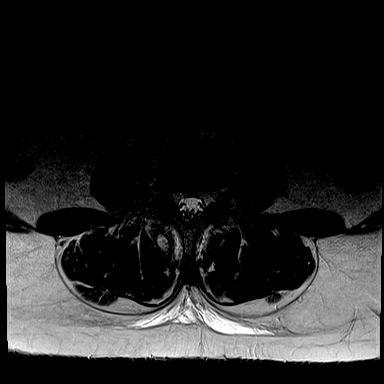
[im 19/35]
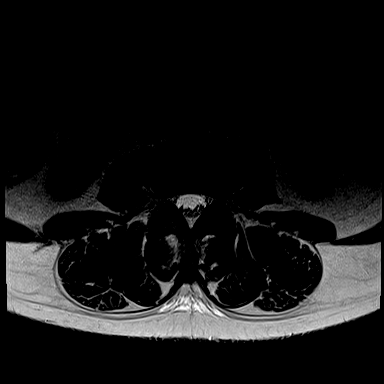
[im 23/35]
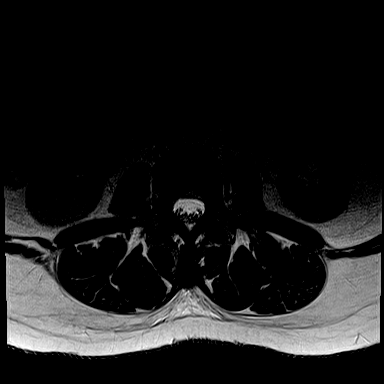
[im 31/35]
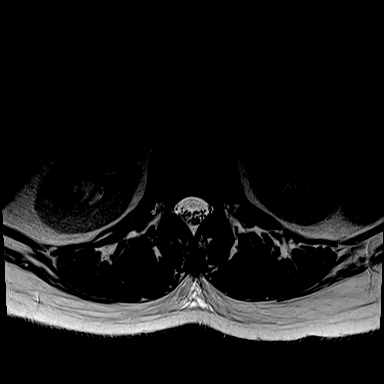
[im 35/35]
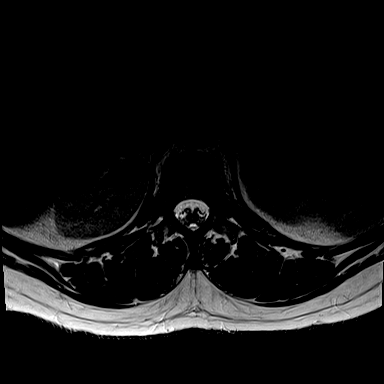

[Series 9: T1 · axial · 4.0mm · 0.34mm/px · z∈[-96,-30]mm · 3 of 35 slices shown (2 of 2)]
[im 1/35]
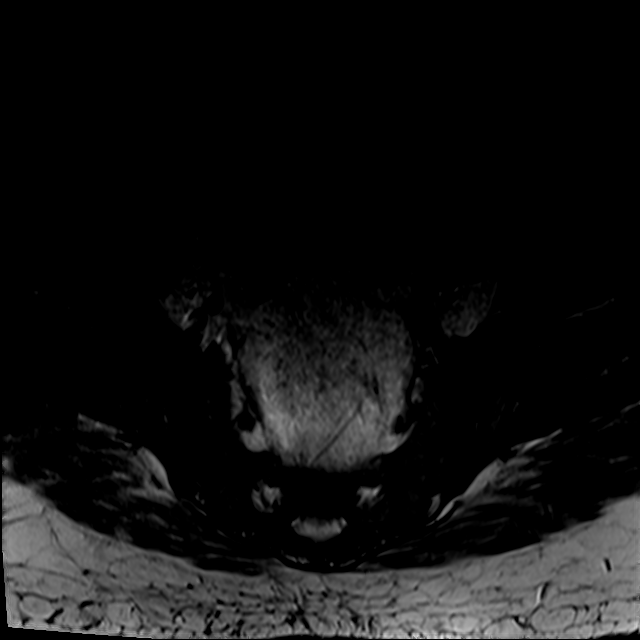
[im 4/35]
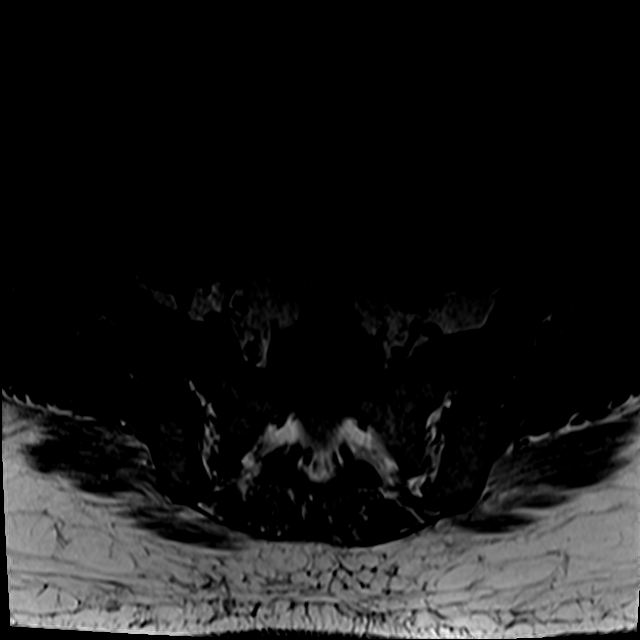
[im 12/35]
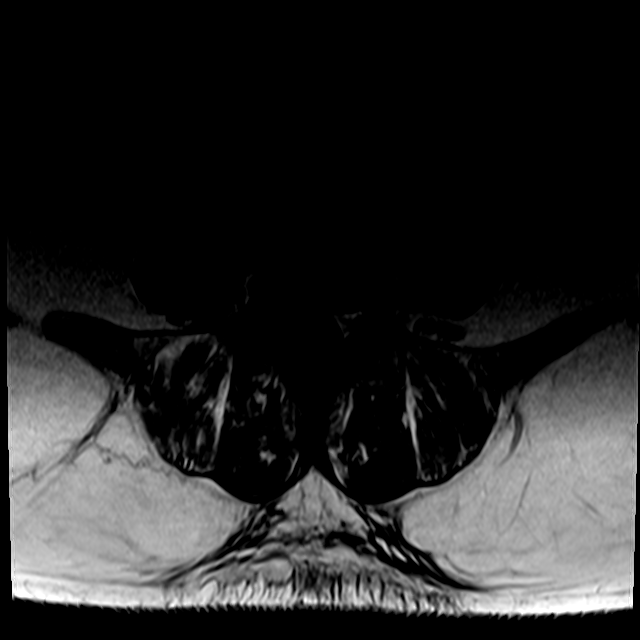

[Series 11: T2 · axial · 4.0mm · 0.57mm/px · z∈[-106,+125]mm · 8 of 35 slices shown (3 of 3)]
[im 1/35]
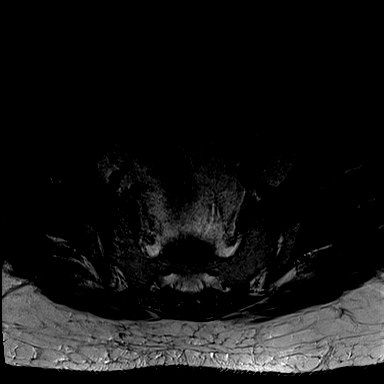
[im 4/35]
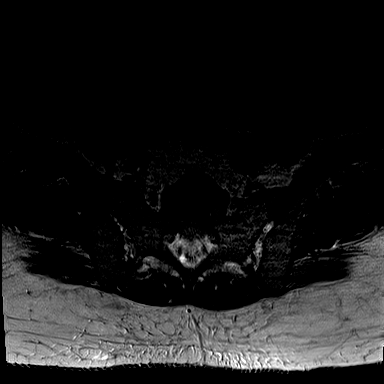
[im 12/35]
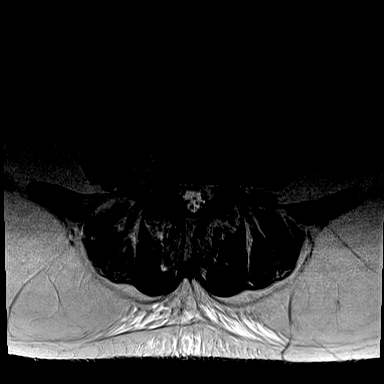
[im 16/35]
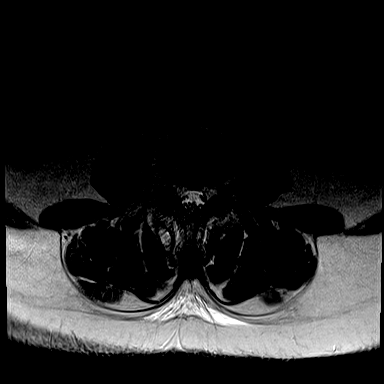
[im 19/35]
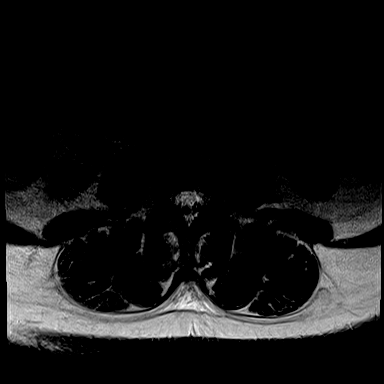
[im 23/35]
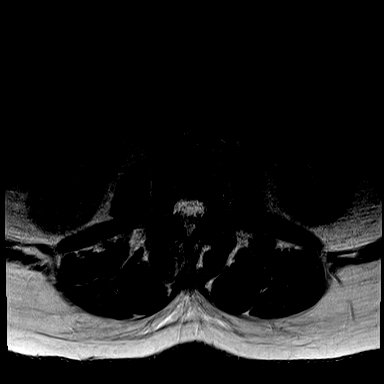
[im 31/35]
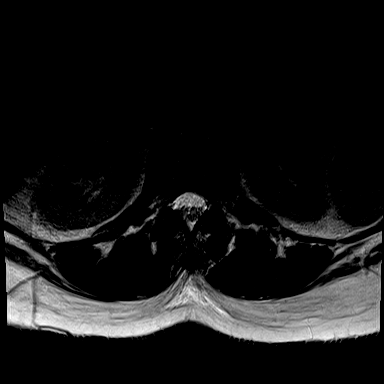
[im 35/35]
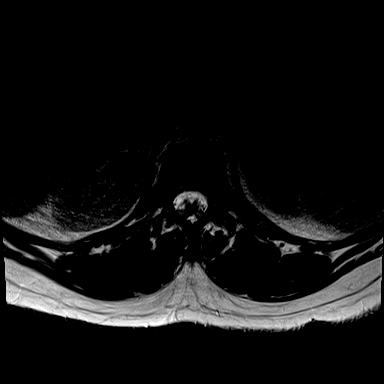

[28 of 48 positions shown; findings below may reference images not displayed]

FINDINGS: Segmentation: Examination degraded by motion artifact.

Transitional lumbosacral anatomy with sacralization of the L5
vertebral body. Same numbering system is employed as on previous
exam.

Alignment: Trace retrolisthesis of L3 on L4, stable. Alignment
otherwise normal with preservation of the normal lumbar lordosis.

Vertebrae: Vertebral body height maintained without evidence for
acute or interval fracture. No evidence for acute discitis. Bone
marrow signal intensity diffusely decreased on T1 weighted imaging,
most commonly related to anemia, smoking, or obesity. No discrete or
worrisome osseous lesions. Inflammatory changes centered about the
right L3-4 facet with associated reactive marrow edema seen, similar
to previous.

Conus medullaris and cauda equina: Conus extends to the T12-L1
level. Conus and cauda equina appear normal.

Paraspinal and other soft tissues: Paraspinous edema adjacent to the
left L3-4 facet. T2 hyperintense collection within the right
posterior paraspinous musculature again seen, measuring
approximately 1.2 x 0.7 x 3.0 cm on today's exam, similar. Again,
this is seen communicating with the underlying right L3-4 facet, and
courses posteriorly and superiorly. Paraspinous soft tissues
demonstrate no other acute finding. Visualized visceral structures
within normal limits.

Disc levels:

L1-2:  Unremarkable.

L2-3:  Unremarkable.

L3-4: Trace retrolisthesis. Mild diffuse disc bulge with disc
desiccation and intervertebral disc space narrowing. Persistent
marrow edema about the right L3-4 facet, with small bilateral joint
effusions, right greater than left. Abnormal T1 hypointensity along
the right dorsal lateral epidural space, similar to previous.
Previously noted small epidural collection at the dorsal epidural
space not as well seen on today's motion degraded exam. Similar mild
right lateral recess stenosis. Mild to moderate right L3 foraminal
narrowing appears mildly worsened. Central canal remains widely
patent. No significant left foraminal narrowing.

L4-5:  Unremarkable.

L5-S1:  Unremarkable.
IMPRESSION: 1. Persistent inflammatory changes surrounding the right L3-4 facet
with infiltration of the adjacent epidural fat. Relatively similar
3.0 cm fluid collection within the posterior right paraspinous soft
tissues. Previously seen 1 cm fluid collection at the dorsal
epidural space not as well seen on today's motion degraded exam.
2. Similar mild right lateral recess stenosis at L3-4, with mildly
worsened mild to moderate right L3 foraminal narrowing.

## 2020-01-12 ENCOUNTER — Encounter: Payer: Self-pay | Admitting: Family Medicine

## 2020-01-12 ENCOUNTER — Ambulatory Visit (INDEPENDENT_AMBULATORY_CARE_PROVIDER_SITE_OTHER): Payer: Medicare Other | Admitting: Family Medicine

## 2020-01-12 ENCOUNTER — Other Ambulatory Visit: Payer: Self-pay

## 2020-01-12 VITALS — BP 114/80 | Ht 62.0 in | Wt 190.0 lb

## 2020-01-12 DIAGNOSIS — G5621 Lesion of ulnar nerve, right upper limb: Secondary | ICD-10-CM | POA: Diagnosis not present

## 2020-01-12 DIAGNOSIS — M899 Disorder of bone, unspecified: Secondary | ICD-10-CM

## 2020-01-12 MED ORDER — CYCLOBENZAPRINE HCL 5 MG PO TABS: 5.0000 mg | ORAL_TABLET | Freq: Two times a day (BID) | ORAL | 1 refills | Status: DC | PRN

## 2020-01-12 MED ORDER — NAPROXEN 500 MG PO TABS: 500.0000 mg | ORAL_TABLET | Freq: Two times a day (BID) | ORAL | 1 refills | Status: AC | PRN

## 2020-01-12 NOTE — Patient Instructions (Signed)
Nice to meet you  Please try the exercises  Please try the rub on medicine. Please do not use the rub on medicine with the naproxen.  Please try heat on the back  Please send me a message in MyChart with any questions or updates.  Please see me back in 2-3 weeks.   --Dr. Jordan Likes

## 2020-01-12 NOTE — Progress Notes (Signed)
Mary Ewing - 49 y.o. female MRN [id number]  Date of birth: 08-Aug-1970  SUBJECTIVE:  Including CC & ROS.  Chief Complaint  Patient presents with  . Shoulder Pain    right x 2 weeks    Mary Ewing is a 49 y.o. female that is presenting with right-sided periscapular pain and right arm pain.  Pain is been ongoing for about 2 weeks.  Is a burning sensation.  Predominately radiates from the elbow down to the ulnar aspect of the third and fourth and fifth digit.  Describes any specific inciting event.  Seems to be a constant.  Seems to be staying the same.  No improvement with modalities to date.  Does have some altered sensation in the fourth and fifth digit.   Review of Systems See HPI   HISTORY: Past Medical, Surgical, Social, and Family History Reviewed & Updated per EMR.   Pertinent Historical Findings include:  Past Medical History:  Diagnosis Date  . Acute renal failure (HCC)   . Anxiety   . Arm numbness 06/25/2018  . Asthmatic bronchitis    history of  . Depression   . DJD (degenerative joint disease)   . GERD (gastroesophageal reflux disease)   . H/O: suicide attempt    x 3  . High cholesterol   . Iron deficiency anemia   . Numbness and tingling in right hand 08/19/2018  . Substance abuse [facility])     Past Surgical History:  Procedure Laterality Date  . APPENDECTOMY  2004  . CESAREAN SECTION    . MOUTH SURGERY     teeth removed  . REFRACTIVE SURGERY    . right tympanoplasty  1999  . TONSILLECTOMY      Family History  Problem Relation Age of Onset  . Asthma Mother   . COPD Mother   . Anxiety disorder Mother   . Diabetes Mother   . Depression Mother   . Hypertension Mother     Social History   Socioeconomic History  . Marital status: Single    Spouse name: Not on file  . Number of children: Not on file  . Years of education: Not on file  . Highest education level: Not on file  Occupational History  . Occupation: disability  Tobacco Use  . Smoking  status: Current Every Day Smoker    Packs/day: 1.00    Years: 24.00    Pack years: 24.00    Types: Cigarettes, E-cigarettes  . Smokeless tobacco: Never Used  . Tobacco comment: needs a patch  Vaping Use  . Vaping Use: Every day  Substance and Sexual Activity  . Alcohol use: Yes    Alcohol/week: 0.0 standard drinks    Comment: occ  . Drug use: Yes    Types: Cocaine, Marijuana    Comment: prescription drugs;last use of marijuana was this AM, denies cocaine   . Sexual activity: Never    Birth control/protection: None  Other Topics Concern  . Not on file  Social History Narrative  . Not on file   Social Determinants of Health   Financial Resource Strain:   . Difficulty of Paying Living Expenses: Not on file  Food Insecurity:   . Worried About Programme researcher, broadcasting/film/video in the Last Year: Not on file  . Ran Out of Food in the Last Year: Not on file  Transportation Needs:   . Lack of Transportation (Medical): Not on file  . Lack of Transportation (Non-Medical): Not on file  Physical  Activity:   . Days of Exercise per Week: Not on file  . Minutes of Exercise per Session: Not on file  Stress:   . Feeling of Stress : Not on file  Social Connections:   . Frequency of Communication with Friends and Family: Not on file  . Frequency of Social Gatherings with Friends and Family: Not on file  . Attends Religious Services: Not on file  . Active Member of Clubs or Organizations: Not on file  . Attends Banker Meetings: Not on file  . Marital Status: Not on file  Intimate Partner Violence:   . Fear of Current or Ex-Partner: Not on file  . Emotionally Abused: Not on file  . Physically Abused: Not on file  . Sexually Abused: Not on file     PHYSICAL EXAM:  VS: BP 114/80   Ht 5\' 2"  (1.575 m)   Wt 190 lb (86.2 kg)   BMI 34.75 kg/m  Physical Exam Gen: NAD, alert, cooperative with exam, well-appearing MSK:  Right shoulder: Normal external rotation. Normal internal  rotation. Normal empty can testing. Normal elbow range of motion. No signs of atrophy. Normal grip strength. Normal finger abduction. Neurovascularly intact     ASSESSMENT & PLAN:   Ulnar neuropathy of right upper extremity Seems to have a component of ulnar neuropathy.  Unclear if this is related to the cervical region or starting at the elbow. -Counseled on home exercise therapy and supportive care. -Provided Rayos samples. -Could consider physical therapy nerve study or injection.  Scapular dysfunction Unclear for pain that is periscapular is more muscular versus radicular nature.  No specific winging of the scapula. -Counseled on home exercise therapy and supportive care. -Flexeril. -Naproxen. - Could consider trigger point injections or physical therapy.

## 2020-01-13 DIAGNOSIS — M899 Disorder of bone, unspecified: Secondary | ICD-10-CM | POA: Insufficient documentation

## 2020-01-13 DIAGNOSIS — G5621 Lesion of ulnar nerve, right upper limb: Secondary | ICD-10-CM | POA: Insufficient documentation

## 2020-01-13 NOTE — Assessment & Plan Note (Signed)
Seems to have a component of ulnar neuropathy.  Unclear if this is related to the cervical region or starting at the elbow. -Counseled on home exercise therapy and supportive care. -Provided Rayos samples. -Could consider physical therapy nerve study or injection.

## 2020-01-13 NOTE — Assessment & Plan Note (Signed)
Unclear for pain that is periscapular is more muscular versus radicular nature.  No specific winging of the scapula. -Counseled on home exercise therapy and supportive care. -Flexeril. -Naproxen. - Could consider trigger point injections or physical therapy.

## 2020-02-04 ENCOUNTER — Ambulatory Visit: Payer: Medicare Other | Admitting: Family Medicine

## 2020-02-04 NOTE — Progress Notes (Deleted)
Mary Ewing - 49 y.o. female MRN [id number]  Date of birth: 29-May-1970  SUBJECTIVE:  Including CC & ROS.  No chief complaint on file.   Mary Ewing is a 49 y.o. female that is  ***.  ***   Review of Systems See HPI   HISTORY: Past Medical, Surgical, Social, and Family History Reviewed & Updated per EMR.   Pertinent Historical Findings include:  Past Medical History:  Diagnosis Date  . Acute renal failure (HCC)   . Anxiety   . Arm numbness 06/25/2018  . Asthmatic bronchitis    history of  . Depression   . DJD (degenerative joint disease)   . GERD (gastroesophageal reflux disease)   . H/O: suicide attempt    x 3  . High cholesterol   . Iron deficiency anemia   . Numbness and tingling in right hand 08/19/2018  . Substance abuse [facility])     Past Surgical History:  Procedure Laterality Date  . APPENDECTOMY  2004  . CESAREAN SECTION    . MOUTH SURGERY     teeth removed  . REFRACTIVE SURGERY    . right tympanoplasty  1999  . TONSILLECTOMY      Family History  Problem Relation Age of Onset  . Asthma Mother   . COPD Mother   . Anxiety disorder Mother   . Diabetes Mother   . Depression Mother   . Hypertension Mother     Social History   Socioeconomic History  . Marital status: Single    Spouse name: Not on file  . Number of children: Not on file  . Years of education: Not on file  . Highest education level: Not on file  Occupational History  . Occupation: disability  Tobacco Use  . Smoking status: Current Every Day Smoker    Packs/day: 1.00    Years: 24.00    Pack years: 24.00    Types: Cigarettes, E-cigarettes  . Smokeless tobacco: Never Used  . Tobacco comment: needs a patch  Vaping Use  . Vaping Use: Every day  Substance and Sexual Activity  . Alcohol use: Yes    Alcohol/week: 0.0 standard drinks    Comment: occ  . Drug use: Yes    Types: Cocaine, Marijuana    Comment: prescription drugs;last use of marijuana was this AM, denies cocaine   .  Sexual activity: Never    Birth control/protection: None  Other Topics Concern  . Not on file  Social History Narrative  . Not on file   Social Determinants of Health   Financial Resource Strain:   . Difficulty of Paying Living Expenses: Not on file  Food Insecurity:   . Worried About Programme researcher, broadcasting/film/video in the Last Year: Not on file  . Ran Out of Food in the Last Year: Not on file  Transportation Needs:   . Lack of Transportation (Medical): Not on file  . Lack of Transportation (Non-Medical): Not on file  Physical Activity:   . Days of Exercise per Week: Not on file  . Minutes of Exercise per Session: Not on file  Stress:   . Feeling of Stress : Not on file  Social Connections:   . Frequency of Communication with Friends and Family: Not on file  . Frequency of Social Gatherings with Friends and Family: Not on file  . Attends Religious Services: Not on file  . Active Member of Clubs or Organizations: Not on file  . Attends Banker Meetings:  Not on file  . Marital Status: Not on file  Intimate Partner Violence:   . Fear of Current or Ex-Partner: Not on file  . Emotionally Abused: Not on file  . Physically Abused: Not on file  . Sexually Abused: Not on file     PHYSICAL EXAM:  VS: There were no vitals taken for this visit. Physical Exam Gen: NAD, alert, cooperative with exam, well-appearing MSK:  ***      ASSESSMENT & PLAN:   No problem-specific Assessment & Plan notes found for this encounter.

## 2021-09-30 ENCOUNTER — Emergency Department (HOSPITAL_COMMUNITY): Payer: Medicare Other

## 2021-09-30 ENCOUNTER — Encounter (HOSPITAL_COMMUNITY): Payer: Self-pay | Admitting: Internal Medicine

## 2021-09-30 ENCOUNTER — Inpatient Hospital Stay (HOSPITAL_COMMUNITY)
Admission: EM | Admit: 2021-09-30 | Discharge: 2021-10-03 | DRG: 917 | Disposition: A | Discharge status: Home / Self Care | Payer: Medicare Other | Attending: Internal Medicine | Admitting: Internal Medicine

## 2021-09-30 ENCOUNTER — Other Ambulatory Visit: Payer: Self-pay

## 2021-09-30 ENCOUNTER — Observation Stay (HOSPITAL_COMMUNITY): Payer: Medicare Other

## 2021-09-30 DIAGNOSIS — N179 Acute kidney failure, unspecified: Secondary | ICD-10-CM | POA: Diagnosis not present

## 2021-09-30 DIAGNOSIS — Z6834 Body mass index (BMI) 34.0-34.9, adult: Secondary | ICD-10-CM

## 2021-09-30 DIAGNOSIS — F17210 Nicotine dependence, cigarettes, uncomplicated: Secondary | ICD-10-CM | POA: Diagnosis present

## 2021-09-30 DIAGNOSIS — G8929 Other chronic pain: Secondary | ICD-10-CM | POA: Diagnosis present

## 2021-09-30 DIAGNOSIS — R7989 Other specified abnormal findings of blood chemistry: Secondary | ICD-10-CM | POA: Diagnosis present

## 2021-09-30 DIAGNOSIS — E039 Hypothyroidism, unspecified: Secondary | ICD-10-CM | POA: Diagnosis present

## 2021-09-30 DIAGNOSIS — Z825 Family history of asthma and other chronic lower respiratory diseases: Secondary | ICD-10-CM

## 2021-09-30 DIAGNOSIS — F32A Depression, unspecified: Secondary | ICD-10-CM | POA: Diagnosis present

## 2021-09-30 DIAGNOSIS — R29898 Other symptoms and signs involving the musculoskeletal system: Secondary | ICD-10-CM | POA: Diagnosis not present

## 2021-09-30 DIAGNOSIS — E7800 Pure hypercholesterolemia, unspecified: Secondary | ICD-10-CM | POA: Diagnosis present

## 2021-09-30 DIAGNOSIS — M6282 Rhabdomyolysis: Secondary | ICD-10-CM | POA: Diagnosis present

## 2021-09-30 DIAGNOSIS — Z818 Family history of other mental and behavioral disorders: Secondary | ICD-10-CM

## 2021-09-30 DIAGNOSIS — T40601A Poisoning by unspecified narcotics, accidental (unintentional), initial encounter: Secondary | ICD-10-CM

## 2021-09-30 DIAGNOSIS — E669 Obesity, unspecified: Secondary | ICD-10-CM | POA: Diagnosis present

## 2021-09-30 DIAGNOSIS — Z8249 Family history of ischemic heart disease and other diseases of the circulatory system: Secondary | ICD-10-CM

## 2021-09-30 DIAGNOSIS — E876 Hypokalemia: Secondary | ICD-10-CM | POA: Diagnosis present

## 2021-09-30 DIAGNOSIS — D72829 Elevated white blood cell count, unspecified: Secondary | ICD-10-CM | POA: Diagnosis present

## 2021-09-30 DIAGNOSIS — F17290 Nicotine dependence, other tobacco product, uncomplicated: Secondary | ICD-10-CM | POA: Diagnosis present

## 2021-09-30 DIAGNOSIS — M899 Disorder of bone, unspecified: Secondary | ICD-10-CM

## 2021-09-30 DIAGNOSIS — M5442 Lumbago with sciatica, left side: Secondary | ICD-10-CM | POA: Diagnosis present

## 2021-09-30 DIAGNOSIS — K7200 Acute and subacute hepatic failure without coma: Secondary | ICD-10-CM | POA: Diagnosis present

## 2021-09-30 DIAGNOSIS — K219 Gastro-esophageal reflux disease without esophagitis: Secondary | ICD-10-CM | POA: Diagnosis present

## 2021-09-30 DIAGNOSIS — Z79890 Hormone replacement therapy (postmenopausal): Secondary | ICD-10-CM

## 2021-09-30 DIAGNOSIS — Z79899 Other long term (current) drug therapy: Secondary | ICD-10-CM

## 2021-09-30 DIAGNOSIS — T401X2A Poisoning by heroin, intentional self-harm, initial encounter: Secondary | ICD-10-CM | POA: Diagnosis not present

## 2021-09-30 DIAGNOSIS — Z833 Family history of diabetes mellitus: Secondary | ICD-10-CM

## 2021-09-30 DIAGNOSIS — M609 Myositis, unspecified: Secondary | ICD-10-CM | POA: Diagnosis present

## 2021-09-30 DIAGNOSIS — R4182 Altered mental status, unspecified: Secondary | ICD-10-CM | POA: Diagnosis not present

## 2021-09-30 LAB — URINALYSIS, ROUTINE W REFLEX MICROSCOPIC
Bilirubin Urine: NEGATIVE
Glucose, UA: NEGATIVE mg/dL
Ketones, ur: NEGATIVE mg/dL
Leukocytes,Ua: NEGATIVE
Nitrite: NEGATIVE
Protein, ur: 100 mg/dL — AB
Specific Gravity, Urine: 1.025 (ref 1.005–1.030)
pH: 5 (ref 5.0–8.0)

## 2021-09-30 LAB — CBC WITH DIFFERENTIAL/PLATELET
Abs Immature Granulocytes: 0.05 10*3/uL (ref 0.00–0.07)
Basophils Absolute: 0 10*3/uL (ref 0.0–0.1)
Basophils Relative: 0 %
Eosinophils Absolute: 0 10*3/uL (ref 0.0–0.5)
Eosinophils Relative: 0 %
HCT: 38.5 % (ref 36.0–46.0)
Hemoglobin: 11.9 g/dL — ABNORMAL LOW (ref 12.0–15.0)
Immature Granulocytes: 0 %
Lymphocytes Relative: 18 %
Lymphs Abs: 2.1 10*3/uL (ref 0.7–4.0)
MCH: 27 pg (ref 26.0–34.0)
MCHC: 30.9 g/dL (ref 30.0–36.0)
MCV: 87.3 fL (ref 80.0–100.0)
Monocytes Absolute: 1.1 10*3/uL — ABNORMAL HIGH (ref 0.1–1.0)
Monocytes Relative: 10 %
Neutro Abs: 8.1 10*3/uL — ABNORMAL HIGH (ref 1.7–7.7)
Neutrophils Relative %: 72 %
Platelets: 292 10*3/uL (ref 150–400)
RBC: 4.41 MIL/uL (ref 3.87–5.11)
RDW: 13.4 % (ref 11.5–15.5)
WBC: 11.4 10*3/uL — ABNORMAL HIGH (ref 4.0–10.5)
nRBC: 0 % (ref 0.0–0.2)

## 2021-09-30 LAB — RAPID URINE DRUG SCREEN, HOSP PERFORMED
Amphetamines: NOT DETECTED
Barbiturates: NOT DETECTED
Benzodiazepines: POSITIVE — AB
Cocaine: NOT DETECTED
Opiates: NOT DETECTED
Tetrahydrocannabinol: NOT DETECTED

## 2021-09-30 LAB — I-STAT BETA HCG BLOOD, ED (MC, WL, AP ONLY): I-stat hCG, quantitative: <5 m[IU]/mL (ref <5)

## 2021-09-30 LAB — COMPREHENSIVE METABOLIC PANEL
ALT: 117 U/L — ABNORMAL HIGH (ref 0–44)
AST: 475 U/L — ABNORMAL HIGH (ref 15–41)
Albumin: 4 g/dL (ref 3.5–5.0)
Alkaline Phosphatase: 110 U/L (ref 38–126)
Anion gap: 13 (ref 5–15)
BUN: 22 mg/dL — ABNORMAL HIGH (ref 6–20)
CO2: 25 mmol/L (ref 22–32)
Calcium: 9.3 mg/dL (ref 8.9–10.3)
Chloride: 99 mmol/L (ref 98–111)
Creatinine, Ser: 2.07 mg/dL — ABNORMAL HIGH (ref 0.44–1.00)
GFR, Estimated: 29 mL/min — ABNORMAL LOW (ref >60)
Glucose, Bld: 100 mg/dL — ABNORMAL HIGH (ref 70–99)
Potassium: 4 mmol/L (ref 3.5–5.1)
Sodium: 137 mmol/L (ref 135–145)
Total Bilirubin: 0.7 mg/dL (ref 0.3–1.2)
Total Protein: 9 g/dL — ABNORMAL HIGH (ref 6.5–8.1)

## 2021-09-30 LAB — ETHANOL: Alcohol, Ethyl (B): <10 mg/dL (ref <10)

## 2021-09-30 LAB — CBG MONITORING, ED: Glucose-Capillary: 108 mg/dL — ABNORMAL HIGH (ref 70–99)

## 2021-09-30 LAB — ACETAMINOPHEN LEVEL
Acetaminophen (Tylenol), Serum: 15 ug/mL (ref 10–30)
Acetaminophen (Tylenol), Serum: <10 ug/mL — ABNORMAL LOW (ref 10–30)

## 2021-09-30 LAB — SALICYLATE LEVEL: Salicylate Lvl: <7 mg/dL — ABNORMAL LOW (ref 7.0–30.0)

## 2021-09-30 MED ORDER — SODIUM CHLORIDE 0.9 % IV SOLN: INTRAVENOUS | Status: DC

## 2021-09-30 MED ORDER — LACTATED RINGERS IV BOLUS: 2000.0000 mL | Freq: Once | INTRAVENOUS | Status: DC

## 2021-09-30 MED ORDER — GABAPENTIN 300 MG PO CAPS
300.0000 mg | ORAL_CAPSULE | Freq: Two times a day (BID) | ORAL | Status: DC
  Administered 2021-09-30 – 2021-10-01 (×3): 300 mg via ORAL
  Filled 2021-09-30 (×3): qty 1

## 2021-09-30 MED ORDER — SODIUM CHLORIDE 0.9 % IV BOLUS
2000.0000 mL | Freq: Once | INTRAVENOUS | Status: AC
  Administered 2021-09-30: 2000 mL via INTRAVENOUS

## 2021-09-30 MED ORDER — ACETAMINOPHEN 325 MG PO TABS
650.0000 mg | ORAL_TABLET | Freq: Once | ORAL | Status: AC
  Administered 2021-09-30: 650 mg via ORAL
  Filled 2021-09-30: qty 2

## 2021-09-30 MED ORDER — SODIUM CHLORIDE 0.9 % IV BOLUS
1000.0000 mL | Freq: Once | INTRAVENOUS | Status: AC
  Administered 2021-09-30: 1000 mL via INTRAVENOUS

## 2021-09-30 MED ORDER — LACTATED RINGERS IV SOLN: INTRAVENOUS | Status: DC

## 2021-09-30 MED ORDER — IBUPROFEN 800 MG PO TABS
800.0000 mg | ORAL_TABLET | Freq: Once | ORAL | Status: AC
  Administered 2021-09-30: 800 mg via ORAL
  Filled 2021-09-30: qty 1

## 2021-09-30 NOTE — ED Notes (Signed)
IV team at bedside 

## 2021-09-30 NOTE — ED Notes (Signed)
Pt ambulated to bathroom without assistance 

## 2021-09-30 NOTE — ED Provider Notes (Signed)
51 year old female who was signed to me by prior provider after having an intentional heroin overdose.  Patient has evidence of acute kidney injury.  Also has evidence of urinary tract infection potentially.  Plan will be for her to have IV hydration with fluids here.  She also complains of having left-sided hip pain.  No suspicion for septic hip.  We will x-ray her hip and admit to the hospital service   Lacretia Leigh, MD 09/30/21 [phone]

## 2021-09-30 NOTE — ED Notes (Signed)
Patient transported to MRI 

## 2021-09-30 NOTE — ED Triage Notes (Signed)
Pt arrived via [facility] from home. Pt was found by son to be unresponsive and had needles laying all around her. It is unknown what pt took. Per EMS, when they got there pt was alert and oriented. Narcan was not given. Pt admitted injecting herself with what she thought was heroin. Pt is A&Ox2 to self and situation.

## 2021-09-30 NOTE — ED Provider Notes (Signed)
[facility] EMERGENCY DEPARTMENT Provider Note   CSN: [id number] Arrival date & time: 09/30/21  1351     History  Chief Complaint  Patient presents with   possible OD    Mary Ewing is a 51 y.o. female.  Pt is a 51 yo female with a pmhx significant for GERD, anxiety, depression, high cholesterol, djd, and polysubstance abuse.  She was found unresponsive at home by her 73 year old son.  The son called EMS.  When EMS arrived, pt was awake and did not require narcan.  Pt does admit to injecting herself with what she thought was heroin, but may have had fentanyl in it.       Home Medications Prior to Admission medications   Medication Sig Start Date End Date Taking? Authorizing Provider  atorvastatin (LIPITOR) 20 MG tablet Take 20 mg by mouth daily at 6 PM.  11/11/14 06/22/18  [provider]  Cholecalciferol (VITAMIN D3) 10 MCG (400 UNIT) CAPS Take 400 Units by mouth daily.    [provider]  cyclobenzaprine (FLEXERIL) 5 MG tablet Take 1 tablet (5 mg total) by mouth 2 (two) times daily as needed for muscle spasms. 01/12/20   Myra Rude, MD  famotidine (PEPCID) 20 MG tablet Take 20 mg by mouth daily.    [provider]  FLUoxetine (PROZAC) 20 MG capsule Take 60 mg by mouth every morning.     [provider]  gabapentin (NEURONTIN) 300 MG capsule Take 600 mg by mouth at bedtime.     [provider]  Lactobacillus (ACIDOPHILUS) CAPS capsule Take 1 capsule by mouth 2 (two) times daily.    [provider]  Melatonin 10 MG TABS Take 10 mg by mouth at bedtime.    [provider]  Multiple Vitamins-Minerals (THERA-M) TABS Take 1 tablet by mouth daily.     [provider]  nicotine (NICODERM CQ - DOSED IN MG/24 HOURS) 14 mg/24hr patch Place 1 patch (14 mg total) onto the skin daily. 05/30/18   Calvert Cantor, MD  QUEtiapine (SEROQUEL) 300 MG tablet Take 300 mg by mouth at bedtime.  04/23/18    [provider]      Allergies    Sulfonamide derivatives    Review of Systems   Review of Systems  All other systems reviewed and are negative.   Physical Exam Updated Vital Signs BP 113/81   Pulse 88   Temp 99.1 F (37.3 C) (Oral)   Resp 16   Ht 5\' 2"  (1.575 m)   Wt 86.2 kg   SpO2 95%   BMI 34.76 kg/m  Physical Exam Vitals and nursing note reviewed.  Constitutional:      Appearance: Normal appearance.  HENT:     Head: Normocephalic and atraumatic.     Right Ear: External ear normal.     Left Ear: External ear normal.     Nose: Nose normal.     Mouth/Throat:     Mouth: Mucous membranes are dry.  Eyes:     Extraocular Movements: Extraocular movements intact.     Conjunctiva/sclera: Conjunctivae normal.     Pupils: Pupils are equal, round, and reactive to light.  Cardiovascular:     Rate and Rhythm: Normal rate and regular rhythm.     Pulses: Normal pulses.     Heart sounds: Normal heart sounds.  Pulmonary:     Effort: Pulmonary effort is normal.     Breath sounds: Normal breath sounds.  Abdominal:     General: Abdomen is flat. Bowel sounds are normal.     Palpations: Abdomen is soft.  Musculoskeletal:        General: Normal range of motion.     Cervical back: Normal range of motion and neck supple.  Skin:    General: Skin is warm.     Capillary Refill: Capillary refill takes less than 2 seconds.  Neurological:     General: No focal deficit present.     Mental Status: She is alert and oriented to person, place, and time.  Psychiatric:        Mood and Affect: Mood normal.        Behavior: Behavior normal.     ED Results / Procedures / Treatments   Labs (all labs ordered are listed, but only abnormal results are displayed) Labs Reviewed  CBG MONITORING, ED - Abnormal; Notable for the following components:      Result Value   Glucose-Capillary 108 (*)    All other components within normal limits  COMPREHENSIVE METABOLIC PANEL  SALICYLATE  LEVEL  ACETAMINOPHEN LEVEL  ETHANOL  RAPID URINE DRUG SCREEN, HOSP PERFORMED  CBC WITH DIFFERENTIAL/PLATELET  URINALYSIS, ROUTINE W REFLEX MICROSCOPIC  I-STAT BETA HCG BLOOD, ED ([facility], [facility], [facility] ONLY)    EKG EKG Interpretation  Date/Time:  Friday September 30 2021 14:25:10 EDT Ventricular Rate:  91 PR Interval:  144 QRS Duration: 84 QT Interval:  372 QTC Calculation: 457 R Axis:   21 Text Interpretation: Normal sinus rhythm Normal ECG When compared with ECG of 19-Jan-2019 11:11, PREVIOUS ECG IS PRESENT No significant change since last tracing Confirmed by Jacalyn Lefevre [id number]) on 09/30/2021 3:12:19 PM  Radiology No results found.  Procedures Procedures    Medications Ordered in ED Medications  sodium chloride 0.9 % bolus 1,000 mL (1,000 mLs Intravenous New Bag/Given 09/30/21 1437)    And  0.9 %  sodium chloride infusion ( Intravenous New Bag/Given 09/30/21 1437)    ED Course/ Medical Decision Making/ A&P                           Medical Decision Making Amount and/or Complexity of Data Reviewed Labs: ordered.  Risk Prescription drug management.   This patient presents to the ED for concern of overdose, this involves an extensive number of treatment options, and is a complaint that carries with it a high risk of complications and morbidity.  The differential diagnosis includes overdose   Co morbidities that complicate the patient evaluation  GERD, anxiety, depression, high cholesterol, djd, and polysubstance abuse   Additional history obtained:  Additional history obtained from epic chart review External records from outside source obtained and reviewed including EMS report   Lab Tests:  I Ordered labs which are pending at shift change  Cardiac Monitoring:  The patient was maintained on a cardiac monitor.  I personally viewed and interpreted the cardiac monitored which showed an underlying rhythm of: nsr  Problem List / ED Course:  Opiate overdose:  Pt will need  observation for several hours.   Social Determinants of Health:  Lives at home   Dispostion:  Pending at shift change       Final Clinical Impression(s) / ED Diagnoses Final diagnoses:  Opiate overdose, accidental or unintentional, initial encounter [facility])    Rx / DC Orders ED Discharge Orders     None         Jacalyn Lefevre, MD  09/30/21 1519  

## 2021-09-30 NOTE — H&P (Signed)
History and Physical    Mary Ewing ZLD:[id number] DOB: Jan 10, 1971 DOA: 09/30/2021  PCP: Macy Mis, MD  Patient coming from: Home.  Chief Complaint: Left lower extremity weakness.  HPI: Mary Ewing is a 51 y.o. female with history of depression hyperlipidemia and prior history of discitis in 2020 presents to the ER because of increasing weakness of the left lower extremity since last evening.  Denies any incontinence of urine or bowel.  Admits to taking heroin.  No fever or chills.  ED Course: In the ER on exam patient does have weakness of the left lower extremity unable to lift it against gravity.  Patient temperature was around 71 F patient labs are significant for mild leukocytosis of 11.4 and creatinine of 2 with markedly elevated LFTs of AST 475 ALT 117 Tylenol level is 15 and salicylate level is less than 7.  UA shows WBCs but negative for leukocyte and nitrates.  Urine also shows hyaline granular and calcium crystals.  Patient started on hydration for acute renal failure and ordered MRI of the L-spine and T-spine which are pending.  Review of Systems: As per HPI, rest all negative.   Past Medical History:  Diagnosis Date   Acute renal failure (HCC)    Anxiety    Arm numbness 06/25/2018   Asthmatic bronchitis    history of   Depression    DJD (degenerative joint disease)    GERD (gastroesophageal reflux disease)    H/O: suicide attempt    x 3   High cholesterol    Iron deficiency anemia    Numbness and tingling in right hand 08/19/2018   Substance abuse [facility])     Past Surgical History:  Procedure Laterality Date   APPENDECTOMY  2004   CESAREAN SECTION     MOUTH SURGERY     teeth removed   REFRACTIVE SURGERY     right tympanoplasty  1999   TONSILLECTOMY       reports that she has been smoking cigarettes and e-cigarettes. She has a 24.00 pack-year smoking history. She has never used smokeless tobacco. She reports current alcohol use. She reports current drug  use. Drugs: Cocaine and Marijuana.  Allergies  Allergen Reactions   Sulfonamide Derivatives Hives    Family History  Problem Relation Age of Onset   Asthma Mother    COPD Mother    Anxiety disorder Mother    Diabetes Mother    Depression Mother    Hypertension Mother     Prior to Admission medications   Medication Sig Start Date End Date Taking? Authorizing Provider  aspirin-acetaminophen-caffeine (EXCEDRIN MIGRAINE) 586 [id number] 4039 MG tablet Take 2 tablets by mouth every 6 (six) hours as needed for headache.   Yes [provider]  atorvastatin (LIPITOR) 20 MG tablet Take 20 mg by mouth at bedtime. 11/11/14 09/30/21 Yes [provider]  Cholecalciferol (VITAMIN D3) 10 MCG (400 UNIT) CAPS Take 400 Units by mouth daily.   Yes [provider]  famotidine (PEPCID) 20 MG tablet Take 20 mg by mouth every evening.   Yes [provider]  FLUoxetine (PROZAC) 20 MG capsule Take 60 mg by mouth every morning.    Yes [provider]  gabapentin (NEURONTIN) 300 MG capsule Take 600-1,200 mg by mouth See admin instructions. 600 mg midmorning 1200 mg at bedtime   Yes [provider]  levothyroxine (SYNTHROID) 75 MCG tablet Take 75 mcg by mouth every evening. 08/16/21  Yes [provider]  Melatonin 10  MG TABS Take 10 mg by mouth at bedtime.   Yes [provider]  meloxicam (MOBIC) 15 MG tablet Take 15 mg by mouth daily at 4 PM.   Yes [provider]  Multiple Vitamins-Minerals (THERA-M) TABS Take 1 tablet by mouth daily.    Yes [provider]  PSEUDOEPHEDRINE HCL PO Take 2 tablets by mouth every 6 (six) hours as needed (congestion).   Yes [provider]  QUEtiapine (SEROQUEL) 300 MG tablet Take 300 mg by mouth at bedtime.  04/23/18  Yes [provider]  cyclobenzaprine (FLEXERIL) 5 MG tablet Take 1 tablet (5 mg total) by mouth 2 (two) times daily as needed for muscle spasms. Patient not taking:  Reported on 09/30/2021 01/12/20   Myra RudeSchmitz, Jeremy E, MD  nicotine (NICODERM CQ - DOSED IN MG/24 HOURS) 14 mg/24hr patch Place 1 patch (14 mg total) onto the skin daily. Patient not taking: Reported on 09/30/2021 05/30/18   Calvert Cantorizwan, Saima, MD    Physical Exam: Constitutional: Moderately built and nourished. Vitals:   09/30/21 1407 09/30/21 1658 09/30/21 1840 09/30/21 2135  BP:  121/90 (!) 132/51 (!) 150/76  Pulse:  98 93 (!) 104  Resp:  17 (!) 22 20  Temp:      TempSrc:      SpO2:  96% 96% 97%  Weight: 86.2 kg     Height: 5\' 2"  (1.575 m)      Eyes: Anicteric no pallor. ENMT: No discharge from the ears eyes nose and mouth. Neck: No mass felt.  No neck rigidity. Respiratory: No rhonchi or crepitations. Cardiovascular: S1-S2 heard. Abdomen: Soft nontender bowel sound present. Musculoskeletal: No edema. Skin: No rash. Neurologic: Alert awake oriented time place and person.  Has weakness of the left lower EXTR unable to lift it against gravity.  Has good sensation. Psychiatric: Appears normal.  Normal affect.   Labs on Admission: I have personally reviewed following labs and imaging studies  CBC: Recent Labs  Lab 09/30/21 1800  WBC 11.4*  NEUTROABS 8.1*  HGB 11.9*  HCT 38.5  MCV 87.3  PLT 292   Basic Metabolic Panel: Recent Labs  Lab 09/30/21 1800  NA 137  K 4.0  CL 99  CO2 25  GLUCOSE 100*  BUN 22*  CREATININE 2.07*  CALCIUM 9.3   GFR: Estimated Creatinine Clearance: 33.1 mL/min (A) (by C-G formula based on SCr of 2.07 mg/dL (H)). Liver Function Tests: Recent Labs  Lab 09/30/21 1800  AST 475*  ALT 117*  ALKPHOS 110  BILITOT 0.7  PROT 9.0*  ALBUMIN 4.0   No results for input(s): "LIPASE", "AMYLASE" in the last 168 hours. No results for input(s): "AMMONIA" in the last 168 hours. Coagulation Profile: No results for input(s): "INR", "PROTIME" in the last 168 hours. Cardiac Enzymes: No results for input(s): "CKTOTAL", "CKMB", "CKMBINDEX", "TROPONINI" in the  last 168 hours. BNP (last 3 results) No results for input(s): "PROBNP" in the last 8760 hours. HbA1C: No results for input(s): "HGBA1C" in the last 72 hours. CBG: Recent Labs  Lab 09/30/21 1444  GLUCAP 108*   Lipid Profile: No results for input(s): "CHOL", "HDL", "LDLCALC", "TRIG", "CHOLHDL", "LDLDIRECT" in the last 72 hours. Thyroid Function Tests: No results for input(s): "TSH", "T4TOTAL", "FREET4", "T3FREE", "THYROIDAB" in the last 72 hours. Anemia Panel: No results for input(s): "VITAMINB12", "FOLATE", "FERRITIN", "TIBC", "IRON", "RETICCTPCT" in the last 72 hours. Urine analysis:    Component Value Date/Time   COLORURINE AMBER (A) 09/30/2021 1805   APPEARANCEUR CLOUDY (  A) 09/30/2021 1805   LABSPEC 1.025 09/30/2021 1805   PHURINE 5.0 09/30/2021 1805   GLUCOSEU NEGATIVE 09/30/2021 1805   HGBUR LARGE (A) 09/30/2021 1805   BILIRUBINUR NEGATIVE 09/30/2021 1805   KETONESUR NEGATIVE 09/30/2021 1805   PROTEINUR 100 (A) 09/30/2021 1805   UROBILINOGEN 0.2 04/09/2014 1314   NITRITE NEGATIVE 09/30/2021 1805   LEUKOCYTESUR NEGATIVE 09/30/2021 1805   Sepsis Labs: @LABRCNTIP (procalcitonin:4,lacticidven:4) )No results found for this or any previous visit (from the past 240 hour(s)).   Radiological Exams on Admission: DG Hip Unilat W or Wo Pelvis 2-3 Views Left  Result Date: 09/30/2021 CLINICAL DATA:  Left hip pain EXAM: DG HIP (WITH OR WITHOUT PELVIS) 2-3V LEFT COMPARISON:  None Available. FINDINGS: There is no evidence of hip fracture or dislocation. There is no evidence of arthropathy or other focal bone abnormality. IMPRESSION: Negative. Electronically Signed   By: 11/30/2021 M.D.   On: 09/30/2021 20:04   DG Chest 1 View  Result Date: 09/30/2021 CLINICAL DATA:  Unresponsive and possible overdose. EXAM: CHEST  1 VIEW COMPARISON:  02/20/2019 FINDINGS: Top-normal heart size. There is no evidence of pulmonary edema, consolidation, pneumothorax or pleural fluid. IMPRESSION: No acute  pulmonary findings.  Top-normal heart size. Electronically Signed   By: 02/22/2019 M.D.   On: 09/30/2021 16:29    EKG: Independently reviewed.  Normal sinus rhythm.  Assessment/Plan Principal Problem:   ARF (acute renal failure) (HCC)    Left lower extremity weakness with prior history of discitis and history of ongoing drug abuse we will get MRI of the T and L-spine to rule out any cord compression or discitis or abscess. Acute renal failure cause not clear.  Gently hydrate follow metabolic panel check CK levels. Elevated LFTs with abdomen appears benign on exam.  Denies nausea vomiting.  Will repeat Tylenol levels LFTs check acute hepatitis panel check CK level to make sure there is no rhabdomyolysis.  Check right upper quadrant abdomen sonogram. History of hyperlipidemia hold statins due to elevated LFTs. History of depression denies any suicidal ideation. Polysubstance abuse admits to taking heroin yesterday.  Advised about quitting.  Social work consult.   DVT prophylaxis: SCDs.  Holding anticoagulation in the setting of elevated LFTs and also MRI pending. Code Status: Full code. Family Communication: Discussed with patient. Disposition Plan: Home. Consults called: Social work.  Physical therapy. Admission status: Observation.   11/30/2021 MD [facility] Pager [phone].  If 7PM-7AM, please contact night-coverage [email or web address] Password [facility]  09/30/2021, 9:47 PM

## 2021-09-30 NOTE — ED Notes (Signed)
Pt has made repeated attempts to reposition herself and is getting in and out of bed without requesting assistance. Side rails have been raised for safety but she has lowered them multiple times and has nearly pulled her IV out a second time. RN wrapped IV site with coban and then wrapped IV tubing to secure to arm in order to reduce the risk of IV compromise. Education provided.

## 2021-10-01 ENCOUNTER — Observation Stay (HOSPITAL_COMMUNITY): Payer: Medicare Other

## 2021-10-01 ENCOUNTER — Other Ambulatory Visit: Payer: Self-pay

## 2021-10-01 ENCOUNTER — Inpatient Hospital Stay (HOSPITAL_COMMUNITY): Payer: Medicare Other

## 2021-10-01 DIAGNOSIS — Z79899 Other long term (current) drug therapy: Secondary | ICD-10-CM | POA: Diagnosis not present

## 2021-10-01 DIAGNOSIS — N179 Acute kidney failure, unspecified: Secondary | ICD-10-CM | POA: Diagnosis present

## 2021-10-01 DIAGNOSIS — F32A Depression, unspecified: Secondary | ICD-10-CM | POA: Diagnosis present

## 2021-10-01 DIAGNOSIS — M609 Myositis, unspecified: Secondary | ICD-10-CM | POA: Diagnosis present

## 2021-10-01 DIAGNOSIS — T401X2A Poisoning by heroin, intentional self-harm, initial encounter: Secondary | ICD-10-CM | POA: Diagnosis present

## 2021-10-01 DIAGNOSIS — K219 Gastro-esophageal reflux disease without esophagitis: Secondary | ICD-10-CM | POA: Diagnosis present

## 2021-10-01 DIAGNOSIS — G8929 Other chronic pain: Secondary | ICD-10-CM | POA: Diagnosis present

## 2021-10-01 DIAGNOSIS — M6282 Rhabdomyolysis: Secondary | ICD-10-CM | POA: Diagnosis present

## 2021-10-01 DIAGNOSIS — F17210 Nicotine dependence, cigarettes, uncomplicated: Secondary | ICD-10-CM | POA: Diagnosis present

## 2021-10-01 DIAGNOSIS — E876 Hypokalemia: Secondary | ICD-10-CM | POA: Diagnosis present

## 2021-10-01 DIAGNOSIS — E7800 Pure hypercholesterolemia, unspecified: Secondary | ICD-10-CM | POA: Diagnosis present

## 2021-10-01 DIAGNOSIS — R29898 Other symptoms and signs involving the musculoskeletal system: Secondary | ICD-10-CM | POA: Diagnosis not present

## 2021-10-01 DIAGNOSIS — Z818 Family history of other mental and behavioral disorders: Secondary | ICD-10-CM | POA: Diagnosis not present

## 2021-10-01 DIAGNOSIS — Z833 Family history of diabetes mellitus: Secondary | ICD-10-CM | POA: Diagnosis not present

## 2021-10-01 DIAGNOSIS — Z6834 Body mass index (BMI) 34.0-34.9, adult: Secondary | ICD-10-CM | POA: Diagnosis not present

## 2021-10-01 DIAGNOSIS — M5442 Lumbago with sciatica, left side: Secondary | ICD-10-CM | POA: Diagnosis present

## 2021-10-01 DIAGNOSIS — R4182 Altered mental status, unspecified: Secondary | ICD-10-CM | POA: Diagnosis present

## 2021-10-01 DIAGNOSIS — E669 Obesity, unspecified: Secondary | ICD-10-CM | POA: Diagnosis present

## 2021-10-01 DIAGNOSIS — Z79890 Hormone replacement therapy (postmenopausal): Secondary | ICD-10-CM | POA: Diagnosis not present

## 2021-10-01 DIAGNOSIS — Z825 Family history of asthma and other chronic lower respiratory diseases: Secondary | ICD-10-CM | POA: Diagnosis not present

## 2021-10-01 DIAGNOSIS — D72829 Elevated white blood cell count, unspecified: Secondary | ICD-10-CM | POA: Diagnosis present

## 2021-10-01 DIAGNOSIS — E039 Hypothyroidism, unspecified: Secondary | ICD-10-CM | POA: Diagnosis present

## 2021-10-01 DIAGNOSIS — F17290 Nicotine dependence, other tobacco product, uncomplicated: Secondary | ICD-10-CM | POA: Diagnosis present

## 2021-10-01 DIAGNOSIS — Z8249 Family history of ischemic heart disease and other diseases of the circulatory system: Secondary | ICD-10-CM | POA: Diagnosis not present

## 2021-10-01 DIAGNOSIS — K7200 Acute and subacute hepatic failure without coma: Secondary | ICD-10-CM | POA: Diagnosis present

## 2021-10-01 LAB — CK
Total CK: 21958 U/L — ABNORMAL HIGH (ref 38–234)
Total CK: 17021 U/L — ABNORMAL HIGH (ref 38–234)

## 2021-10-01 LAB — HEPATITIS PANEL, ACUTE
HCV Ab: NONREACTIVE
Hep A IgM: NONREACTIVE
Hep B C IgM: NONREACTIVE
Hepatitis B Surface Ag: NONREACTIVE

## 2021-10-01 LAB — HEPATIC FUNCTION PANEL
ALT: 102 U/L — ABNORMAL HIGH (ref 0–44)
AST: 414 U/L — ABNORMAL HIGH (ref 15–41)
Albumin: 3 g/dL — ABNORMAL LOW (ref 3.5–5.0)
Alkaline Phosphatase: 82 U/L (ref 38–126)
Bilirubin, Direct: 0.7 mg/dL — ABNORMAL HIGH (ref 0.0–0.2)
Indirect Bilirubin: 0.6 mg/dL (ref 0.3–0.9)
Total Bilirubin: 1.3 mg/dL — ABNORMAL HIGH (ref 0.3–1.2)
Total Protein: 7 g/dL (ref 6.5–8.1)

## 2021-10-01 LAB — COMPREHENSIVE METABOLIC PANEL
ALT: 95 U/L — ABNORMAL HIGH (ref 0–44)
AST: 340 U/L — ABNORMAL HIGH (ref 15–41)
Albumin: 3 g/dL — ABNORMAL LOW (ref 3.5–5.0)
Alkaline Phosphatase: 86 U/L (ref 38–126)
Anion gap: 9 (ref 5–15)
BUN: 16 mg/dL (ref 6–20)
CO2: 21 mmol/L — ABNORMAL LOW (ref 22–32)
Calcium: 8 mg/dL — ABNORMAL LOW (ref 8.9–10.3)
Chloride: 110 mmol/L (ref 98–111)
Creatinine, Ser: 1.39 mg/dL — ABNORMAL HIGH (ref 0.44–1.00)
GFR, Estimated: 46 mL/min — ABNORMAL LOW (ref >60)
Glucose, Bld: 118 mg/dL — ABNORMAL HIGH (ref 70–99)
Potassium: 3.3 mmol/L — ABNORMAL LOW (ref 3.5–5.1)
Sodium: 140 mmol/L (ref 135–145)
Total Bilirubin: 0.4 mg/dL (ref 0.3–1.2)
Total Protein: 7 g/dL (ref 6.5–8.1)

## 2021-10-01 LAB — CBC WITH DIFFERENTIAL/PLATELET
Abs Immature Granulocytes: 0.03 10*3/uL (ref 0.00–0.07)
Basophils Absolute: 0 10*3/uL (ref 0.0–0.1)
Basophils Relative: 0 %
Eosinophils Absolute: 0.1 10*3/uL (ref 0.0–0.5)
Eosinophils Relative: 1 %
HCT: 31 % — ABNORMAL LOW (ref 36.0–46.0)
Hemoglobin: 10.1 g/dL — ABNORMAL LOW (ref 12.0–15.0)
Immature Granulocytes: 0 %
Lymphocytes Relative: 20 %
Lymphs Abs: 1.6 10*3/uL (ref 0.7–4.0)
MCH: 27.6 pg (ref 26.0–34.0)
MCHC: 32.6 g/dL (ref 30.0–36.0)
MCV: 84.7 fL (ref 80.0–100.0)
Monocytes Absolute: 0.8 10*3/uL (ref 0.1–1.0)
Monocytes Relative: 11 %
Neutro Abs: 5.4 10*3/uL (ref 1.7–7.7)
Neutrophils Relative %: 68 %
Platelets: 191 10*3/uL (ref 150–400)
RBC: 3.66 MIL/uL — ABNORMAL LOW (ref 3.87–5.11)
RDW: 13.5 % (ref 11.5–15.5)
WBC: 7.9 10*3/uL (ref 4.0–10.5)
nRBC: 0 % (ref 0.0–0.2)

## 2021-10-01 LAB — PHOSPHORUS: Phosphorus: 2.6 mg/dL (ref 2.5–4.6)

## 2021-10-01 LAB — MAGNESIUM: Magnesium: 1.9 mg/dL (ref 1.7–2.4)

## 2021-10-01 LAB — HIV ANTIBODY (ROUTINE TESTING W REFLEX): HIV Screen 4th Generation wRfx: NONREACTIVE

## 2021-10-01 MED ORDER — FLUOXETINE HCL 20 MG PO CAPS
60.0000 mg | ORAL_CAPSULE | Freq: Every day | ORAL | Status: DC
  Administered 2021-10-01 – 2021-10-03 (×3): 60 mg via ORAL
  Filled 2021-10-01 (×3): qty 3

## 2021-10-01 MED ORDER — GADOBUTROL 1 MMOL/ML IV SOLN
8.0000 mL | Freq: Once | INTRAVENOUS | Status: AC | PRN
  Administered 2021-10-01: 8 mL via INTRAVENOUS

## 2021-10-01 MED ORDER — POTASSIUM CHLORIDE CRYS ER 20 MEQ PO TBCR
20.0000 meq | EXTENDED_RELEASE_TABLET | Freq: Two times a day (BID) | ORAL | Status: AC
  Administered 2021-10-01 – 2021-10-02 (×4): 20 meq via ORAL
  Filled 2021-10-01 (×4): qty 1

## 2021-10-01 MED ORDER — LEVOTHYROXINE SODIUM 75 MCG PO TABS
75.0000 ug | ORAL_TABLET | Freq: Every day | ORAL | Status: DC
  Administered 2021-10-01 – 2021-10-03 (×3): 75 ug via ORAL
  Filled 2021-10-01 (×3): qty 1

## 2021-10-01 MED ORDER — SODIUM CHLORIDE 0.9 % IV BOLUS
1000.0000 mL | Freq: Once | INTRAVENOUS | Status: AC
Start: 2021-10-01 — End: 2021-10-01
  Administered 2021-10-01: 1000 mL via INTRAVENOUS

## 2021-10-01 MED ORDER — NICOTINE 21 MG/24HR TD PT24
21.0000 mg | MEDICATED_PATCH | Freq: Every day | TRANSDERMAL | Status: DC
  Administered 2021-10-01 – 2021-10-02 (×2): 21 mg via TRANSDERMAL
  Filled 2021-10-01 (×2): qty 1

## 2021-10-01 MED ORDER — QUETIAPINE FUMARATE 100 MG PO TABS
300.0000 mg | ORAL_TABLET | Freq: Every day | ORAL | Status: DC
  Administered 2021-10-01 – 2021-10-02 (×3): 300 mg via ORAL
  Filled 2021-10-01 (×3): qty 3

## 2021-10-01 MED ORDER — OXYCODONE HCL 5 MG PO TABS
10.0000 mg | ORAL_TABLET | Freq: Four times a day (QID) | ORAL | Status: DC | PRN
  Administered 2021-10-01 – 2021-10-03 (×8): 10 mg via ORAL
  Filled 2021-10-01 (×8): qty 2

## 2021-10-01 MED ORDER — ACETAMINOPHEN 325 MG PO TABS
650.0000 mg | ORAL_TABLET | Freq: Four times a day (QID) | ORAL | Status: DC | PRN
  Administered 2021-10-01 – 2021-10-03 (×5): 650 mg via ORAL
  Filled 2021-10-01 (×5): qty 2

## 2021-10-01 MED ORDER — FENTANYL CITRATE PF 50 MCG/ML IJ SOSY
12.5000 ug | PREFILLED_SYRINGE | Freq: Once | INTRAMUSCULAR | Status: AC
  Administered 2021-10-01: 12.5 ug via INTRAVENOUS
  Filled 2021-10-01: qty 1

## 2021-10-01 MED ORDER — LORAZEPAM 2 MG/ML IJ SOLN
1.0000 mg | Freq: Once | INTRAMUSCULAR | Status: AC
  Administered 2021-10-01: 1 mg via INTRAVENOUS
  Filled 2021-10-01: qty 1

## 2021-10-01 NOTE — Progress Notes (Signed)
PROGRESS NOTE    Mary Ewing  PPI:[id number] DOB: [birth year] DOA: 09/30/2021 PCP: Macy Mis, MD    Brief Narrative:  51 year old with history of depression, hyperlipidemia, injectable drug use, history of discitis in 2020 present to the ER with left lower extremity weakness, pain all over the body and unable to mobilize.  In the emergency room she had weakness of left lower extremity secondary to left hip pain afebrile.  Mild leukocytosis.  Creatinine 2, elevated AST and LFT.  MRI of the thoracic and lumbar spine without any acute neurological deficits.  Creatinine kinase more than 20,000.   Assessment & Plan:   Acute nontraumatic rhabdomyolysis possibly secondary to IV drug, likely heroin.  Acute renal failure. Acute liver failure secondary to vasoactive IV drug Left lower extremity weakness secondary to painful muscles/left hip joint:  Draw blood cultures.  Currently no evidence of bacterial infection, delaying antibiotics until infection work-up. MRI of the thoracic and lumbar spine with no evidence of osteomyelitis, will check left hip CT scan, no clinical evidence of joint effusion but decreased muscle pain. Continue maintenance IV fluids, close monitoring of creatinine kinase, intake and output measurements.  Repeat renal functions. Painful limbs, will use oral opiates for pain control with plan to quickly de-escalate therapy. Counseled to quit, patient tells me that she will do it by herself and declines any intervention or counseling.  Hypothyroidism: On Synthroid.  Depression: On Seroquel and fluoxetine.  Continue.  Hypokalemia: Replace and monitor.    DVT prophylaxis: SCDs Start: 09/30/21 2154   Code Status: Full code Family Communication: None Disposition Plan: Status is: Inpatient Remains inpatient appropriate because: Significant rhabdomyolysis, abnormal liver test, needing IV fluids, needs to infection work-up.     Consultants:  None  Procedures:   None  Antimicrobials:  None   Subjective: Patient seen and examined.  Complains of left hip and thigh pain and unable to lift her leg.  Afebrile.  Mobility is restricted because of pain.  She tells me that she was injecting heroin in her arms but she thinks it could be fentanyl instead of heroin.  Denies evidence of withdrawals. She is quite knowledgeable about medical issues and aware about problems with drug use.  Objective: Vitals:   10/01/21 0027 10/01/21 0054 10/01/21 0412 10/01/21 0850  BP:  (!) 154/81 (!) 129/56 132/60  Pulse:  73 93 88  Resp:  18 18 18   Temp: 99.2 F (37.3 C) 98.8 F (37.1 C) 98.6 F (37 C) 98.4 F (36.9 C)  TempSrc: Oral  Oral Oral  SpO2:  100% 98% 97%  Weight:  84.7 kg    Height:  5\' 2"  (1.575 m)      Intake/Output Summary (Last 24 hours) at 10/01/2021 1209 Last data filed at 10/01/2021 0900 Gross per 24 hour  Intake 5905.5 ml  Output 200 ml  Net 5705.5 ml   Filed Weights   09/30/21 1407 10/01/21 0054  Weight: 86.2 kg 84.7 kg    Examination:  General exam: Appears anxious, calm at rest, anxious and in moderate distress with mobility and electrocardiogram. Respiratory system: Clear to auscultation. Respiratory effort normal. Cardiovascular system: S1 & S2 heard, RRR. No JVD, murmurs, rubs, gallops or clicks. No pedal edema. Gastrointestinal system: Abdomen is nondistended, soft and nontender. No organomegaly or masses felt. Normal bowel sounds heard. Central nervous system: Alert and oriented. No focal neurological deficits. Patient has induration and tenderness both deltoid area with multiple injection marks, no evidence of fluctuation or abscess.  Left hip has full range of motion, motor and sensory power is normal but limited due to muscle pain. No evidence of deformity swelling, localized tenderness in the muscles.     Data Reviewed: I have personally reviewed following labs and imaging studies  CBC: Recent Labs  Lab 09/30/21 1800  10/01/21 0636  WBC 11.4* 7.9  NEUTROABS 8.1* 5.4  HGB 11.9* 10.1*  HCT 38.5 31.0*  MCV 87.3 84.7  PLT 292 191   Basic Metabolic Panel: Recent Labs  Lab 09/30/21 1800 10/01/21 0636  NA 137 140  K 4.0 3.3*  CL 99 110  CO2 25 21*  GLUCOSE 100* 118*  BUN 22* 16  CREATININE 2.07* 1.39*  CALCIUM 9.3 8.0*  MG  --  1.9  PHOS  --  2.6   GFR: Estimated Creatinine Clearance: 48.8 mL/min (A) (by C-G formula based on SCr of 1.39 mg/dL (H)). Liver Function Tests: Recent Labs  Lab 09/30/21 1800 09/30/21 2240 10/01/21 0636  AST 475* 414* 340*  ALT 117* 102* 95*  ALKPHOS 110 82 86  BILITOT 0.7 1.3* 0.4  PROT 9.0* 7.0 7.0  ALBUMIN 4.0 3.0* 3.0*   No results for input(s): "LIPASE", "AMYLASE" in the last 168 hours. No results for input(s): "AMMONIA" in the last 168 hours. Coagulation Profile: No results for input(s): "INR", "PROTIME" in the last 168 hours. Cardiac Enzymes: Recent Labs  Lab 09/30/21 2240 10/01/21 0636  CKTOTAL 21,958* 17,021*   BNP (last 3 results) No results for input(s): "PROBNP" in the last 8760 hours. HbA1C: No results for input(s): "HGBA1C" in the last 72 hours. CBG: Recent Labs  Lab 09/30/21 1444  GLUCAP 108*   Lipid Profile: No results for input(s): "CHOL", "HDL", "LDLCALC", "TRIG", "CHOLHDL", "LDLDIRECT" in the last 72 hours. Thyroid Function Tests: No results for input(s): "TSH", "T4TOTAL", "FREET4", "T3FREE", "THYROIDAB" in the last 72 hours. Anemia Panel: No results for input(s): "VITAMINB12", "FOLATE", "FERRITIN", "TIBC", "IRON", "RETICCTPCT" in the last 72 hours. Sepsis Labs: No results for input(s): "PROCALCITON", "LATICACIDVEN" in the last 168 hours.  No results found for this or any previous visit (from the past 240 hour(s)).       Radiology Studies: MR LUMBAR SPINE WO CONTRAST  Result Date: 10/01/2021 CLINICAL DATA:  Low back pain with left lower extremity weakness EXAM: MRI THORACIC AND LUMBAR SPINE WITHOUT CONTRAST  TECHNIQUE: Multiplanar and multiecho pulse sequences of the thoracic and lumbar spine were obtained without intravenous contrast. COMPARISON:  None Available. FINDINGS: MRI THORACIC SPINE FINDINGS Alignment:  Physiologic. Vertebrae: No fracture, evidence of discitis, or bone lesion. Cord:  Normal signal and morphology. Paraspinal and other soft tissues: Negative. Disc levels: No spinal canal stenosis. MRI LUMBAR SPINE FINDINGS Segmentation: There is transitional lumbosacral anatomy with a rudimentary disc space at L5-S1. Alignment:  Physiologic. Vertebrae:  No fracture, evidence of discitis, or bone lesion. Conus medullaris and cauda equina: Conus extends to the T12 level. Conus and cauda equina appear normal. Paraspinal and other soft tissues: Negative Disc levels: L1-L2: Normal disc space and facet joints. No spinal canal stenosis. No neural foraminal stenosis. L2-L3: Normal disc space and facet joints. No spinal canal stenosis. No neural foraminal stenosis. L3-L4: Small central disc protrusion and mild facet widening. No spinal canal stenosis. Unchanged mild right neural foraminal stenosis. L4-L5: Normal disc space and facet joints. No spinal canal stenosis. No neural foraminal stenosis. L5-S1: Rudimentary disc space. No spinal canal stenosis. No neural foraminal stenosis. Visualized sacrum: Normal. IMPRESSION: 1. Transitional lumbosacral anatomy with a  rudimentary disc space at L5-S1. 2. Unchanged mild right L3-4 neural foraminal stenosis. 3. No cervical or thoracic spinal canal stenosis. Electronically Signed   By: Deatra RobinsonKevin  Herman M.D.   On: 10/01/2021 01:13   MR THORACIC SPINE WO CONTRAST  Result Date: 10/01/2021 CLINICAL DATA:  Low back pain with left lower extremity weakness EXAM: MRI THORACIC AND LUMBAR SPINE WITHOUT CONTRAST TECHNIQUE: Multiplanar and multiecho pulse sequences of the thoracic and lumbar spine were obtained without intravenous contrast. COMPARISON:  None Available. FINDINGS: MRI THORACIC  SPINE FINDINGS Alignment:  Physiologic. Vertebrae: No fracture, evidence of discitis, or bone lesion. Cord:  Normal signal and morphology. Paraspinal and other soft tissues: Negative. Disc levels: No spinal canal stenosis. MRI LUMBAR SPINE FINDINGS Segmentation: There is transitional lumbosacral anatomy with a rudimentary disc space at L5-S1. Alignment:  Physiologic. Vertebrae:  No fracture, evidence of discitis, or bone lesion. Conus medullaris and cauda equina: Conus extends to the T12 level. Conus and cauda equina appear normal. Paraspinal and other soft tissues: Negative Disc levels: L1-L2: Normal disc space and facet joints. No spinal canal stenosis. No neural foraminal stenosis. L2-L3: Normal disc space and facet joints. No spinal canal stenosis. No neural foraminal stenosis. L3-L4: Small central disc protrusion and mild facet widening. No spinal canal stenosis. Unchanged mild right neural foraminal stenosis. L4-L5: Normal disc space and facet joints. No spinal canal stenosis. No neural foraminal stenosis. L5-S1: Rudimentary disc space. No spinal canal stenosis. No neural foraminal stenosis. Visualized sacrum: Normal. IMPRESSION: 1. Transitional lumbosacral anatomy with a rudimentary disc space at L5-S1. 2. Unchanged mild right L3-4 neural foraminal stenosis. 3. No cervical or thoracic spinal canal stenosis. Electronically Signed   By: Deatra RobinsonKevin  Herman M.D.   On: 10/01/2021 01:13   DG Hip Unilat W or Wo Pelvis 2-3 Views Left  Result Date: 09/30/2021 CLINICAL DATA:  Left hip pain EXAM: DG HIP (WITH OR WITHOUT PELVIS) 2-3V LEFT COMPARISON:  None Available. FINDINGS: There is no evidence of hip fracture or dislocation. There is no evidence of arthropathy or other focal bone abnormality. IMPRESSION: Negative. Electronically Signed   By: Charlett NoseKevin  Dover M.D.   On: 09/30/2021 20:04   DG Chest 1 View  Result Date: 09/30/2021 CLINICAL DATA:  Unresponsive and possible overdose. EXAM: CHEST  1 VIEW COMPARISON:   02/20/2019 FINDINGS: Top-normal heart size. There is no evidence of pulmonary edema, consolidation, pneumothorax or pleural fluid. IMPRESSION: No acute pulmonary findings.  Top-normal heart size. Electronically Signed   By: Irish LackGlenn  Yamagata M.D.   On: 09/30/2021 16:29        Scheduled Meds:  FLUoxetine  60 mg Oral Daily   gabapentin  300 mg Oral BID   levothyroxine  75 mcg Oral Q0600   QUEtiapine  300 mg Oral QHS   Continuous Infusions:  sodium chloride 125 mL/hr at 10/01/21 0954     LOS: 0 days    Time spent: 35 minutes    Dorcas CarrowKuber Cordarro Spinnato, MD [facility] Pager [phone]

## 2021-10-01 NOTE — Plan of Care (Signed)
Pain remains an issue.  Pt now has PRN Oxycodone ordered. Hilton Sinclair BSN RN CMSRN 10/01/2021, 9:25 PM  Problem: Pain Managment: Goal: General experience of comfort will improve Outcome: Not Progressing

## 2021-10-02 DIAGNOSIS — R29898 Other symptoms and signs involving the musculoskeletal system: Secondary | ICD-10-CM | POA: Diagnosis not present

## 2021-10-02 MED ORDER — CYCLOBENZAPRINE HCL 5 MG PO TABS
5.0000 mg | ORAL_TABLET | Freq: Three times a day (TID) | ORAL | Status: DC | PRN
  Administered 2021-10-02 – 2021-10-03 (×3): 5 mg via ORAL
  Filled 2021-10-02 (×4): qty 1

## 2021-10-02 MED ORDER — GABAPENTIN 400 MG PO CAPS
400.0000 mg | ORAL_CAPSULE | Freq: Three times a day (TID) | ORAL | Status: DC
  Administered 2021-10-02 – 2021-10-03 (×4): 400 mg via ORAL
  Filled 2021-10-02 (×4): qty 1

## 2021-10-02 NOTE — Progress Notes (Signed)
Ortho Note  Received consult regarding this patient. Patient with MRI findings of intramuscular and fascial edema in setting of intramuscular injections and rhabdomyolysis. No surgical collection seen on MRI or CT scan to address operative. Recommend ESR and CRP to get more clearer picture of potential infectious process. Could also consider NSAIDs once creatinine trends down. Ortho consult to follow.  Shona Needles, MD [facility] [phone] (office) [email or web address]

## 2021-10-02 NOTE — Plan of Care (Signed)

## 2021-10-02 NOTE — Progress Notes (Signed)
PROGRESS NOTE    Mary Ewing  CZY:[id number] DOB: Jul 27, 1970 DOA: 09/30/2021 PCP: Macy Mis, MD    Brief Narrative:  51 year old with history of depression, hyperlipidemia, injectable drug use, history of discitis in 2020 present to the ER with left lower extremity weakness, pain all over the body and unable to mobilize.  In the emergency room she had weakness of left lower extremity secondary to left hip pain afebrile.  Mild leukocytosis.  Creatinine 2, elevated AST and LFT.  MRI of the thoracic and lumbar spine without any acute neurological deficits.  Creatinine kinase more than 20,000.   Assessment & Plan:   Acute nontraumatic rhabdomyolysis possibly secondary to IV drug, likely heroin.  Acute renal failure. 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 Blood cultures 6/10 negative so far, repeat blood cultures today. MRI of the thoracic and lumbar spine with no evidence of osteomyelitis,  MRI of the left hip without evidence of septic arthritis , it shows diffuse myositis.   No clinical evidence of necrotizing fasciitis.  Called and discussed with Dr. Jena Gauss from orthopedics if she needs any debridement /drainage or I&D.   Continue maintenance IV fluids, close monitoring of creatinine kinase, intake and output measurements.  Repeat renal functions. Painful limbs, will use oral opiates for pain control with plan to quickly de-escalate therapy. Counseled to quit, Increase dose of gabapentin, start Flexeril and other nonopiate pain regimen.  Hypothyroidism: On Synthroid.  Depression: On Seroquel and fluoxetine.  Continue.  Hypokalemia: Replaced.  Monitor.    DVT prophylaxis: SCDs Start: 09/30/21 2154   Code Status: Full code Family  Communication: None Disposition Plan: Status is: Inpatient Remains inpatient appropriate because: Significant rhabdomyolysis, abnormal liver test, needing IV fluids, needs to infection work-up.     Consultants:  None  Procedures:  None  Antimicrobials:  None   Subjective:  Patient seen and examined.  Low-grade temperature overnight.  Left hip hurts but oxycodone helps.  Able to get out of bed with cane.  Tearful and emotional.  No other complaints.  Objective: Vitals:   10/01/21 0850 10/01/21 2029 10/02/21 0501 10/02/21 0846  BP: 132/60 (!) 146/78 (!) 108/55 120/72  Pulse: 88 81 71 73  Resp: 18 18 18 18   Temp: 98.4 F (36.9 C) (!) 100.9 F (38.3 C) 98.6 F (37 C) 99.2 F (37.3 C)  TempSrc: Oral Oral  Oral  SpO2: 97% 100% 96% 99%  Weight:      Height:        Intake/Output Summary (Last 24 hours) at 10/02/2021 1008 Last data filed at 10/02/2021 0746 Gross per 24 hour  Intake 2485.96 ml  Output 650 ml  Net 1835.96 ml   Filed Weights   09/30/21 1407 10/01/21 0054  Weight: 86.2 kg 84.7 kg    Examination:  General exam: Appears anxious, calm at rest, anxious and in moderate distress with mobility and examination. Respiratory system: Clear to auscultation. Respiratory effort normal. Cardiovascular system: S1 & S2 heard, RRR. No JVD, murmurs, rubs, gallops or clicks. No pedal edema. Gastrointestinal system: Abdomen is nondistended, soft and nontender. No organomegaly or masses felt. Normal bowel sounds heard. Central nervous system: Alert and oriented. No focal neurological deficits. Patient has induration and tenderness both deltoid area with multiple injection marks, no evidence of fluctuation or  abscess. Left hip has full range of motion, motor and sensory power is normal but limited due to muscle pain. No evidence of deformity swelling, diffuse tenderness without visible erythema or induration along the lateral hip musculature.     Data Reviewed: I have  personally reviewed following labs and imaging studies  CBC: Recent Labs  Lab 09/30/21 1800 10/01/21 0636  WBC 11.4* 7.9  NEUTROABS 8.1* 5.4  HGB 11.9* 10.1*  HCT 38.5 31.0*  MCV 87.3 84.7  PLT 292 191   Basic Metabolic Panel: Recent Labs  Lab 09/30/21 1800 10/01/21 0636  NA 137 140  K 4.0 3.3*  CL 99 110  CO2 25 21*  GLUCOSE 100* 118*  BUN 22* 16  CREATININE 2.07* 1.39*  CALCIUM 9.3 8.0*  MG  --  1.9  PHOS  --  2.6   GFR: Estimated Creatinine Clearance: 48.8 mL/min (A) (by C-G formula based on SCr of 1.39 mg/dL (H)). Liver Function Tests: Recent Labs  Lab 09/30/21 1800 09/30/21 2240 10/01/21 0636  AST 475* 414* 340*  ALT 117* 102* 95*  ALKPHOS 110 82 86  BILITOT 0.7 1.3* 0.4  PROT 9.0* 7.0 7.0  ALBUMIN 4.0 3.0* 3.0*   No results for input(s): "LIPASE", "AMYLASE" in the last 168 hours. No results for input(s): "AMMONIA" in the last 168 hours. Coagulation Profile: No results for input(s): "INR", "PROTIME" in the last 168 hours. Cardiac Enzymes: Recent Labs  Lab 09/30/21 2240 10/01/21 0636  CKTOTAL 21,958* 17,021*   BNP (last 3 results) No results for input(s): "PROBNP" in the last 8760 hours. HbA1C: No results for input(s): "HGBA1C" in the last 72 hours. CBG: Recent Labs  Lab 09/30/21 1444  GLUCAP 108*   Lipid Profile: No results for input(s): "CHOL", "HDL", "LDLCALC", "TRIG", "CHOLHDL", "LDLDIRECT" in the last 72 hours. Thyroid Function Tests: No results for input(s): "TSH", "T4TOTAL", "FREET4", "T3FREE", "THYROIDAB" in the last 72 hours. Anemia Panel: No results for input(s): "VITAMINB12", "FOLATE", "FERRITIN", "TIBC", "IRON", "RETICCTPCT" in the last 72 hours. Sepsis Labs: No results for input(s): "PROCALCITON", "LATICACIDVEN" in the last 168 hours.  No results found for this or any previous visit (from the past 240 hour(s)).       Radiology Studies: MR HIP LEFT W WO CONTRAST  Result Date: 10/02/2021 CLINICAL DATA:  Limping, acute,  hip pain (Ped 0-5y) Injectable drug user, suspected septic arthritis, myofascial abscess. EXAM: MRI OF THE LEFT HIP WITHOUT AND WITH CONTRAST TECHNIQUE: Multiplanar, multisequence MR imaging was performed both before and after administration of intravenous contrast. CONTRAST:  8mL GADAVIST GADOBUTROL 1 MMOL/ML IV SOLN COMPARISON:  CT 10/01/2021. FINDINGS: Bones/Joint/Cartilage There is no significant marrow signal alteration. The cortex is intact. There is no SI joint effusion or capsular thickening. There is a small left hip joint effusion. Muscles and Tendons There is extensive intramuscular and inter fascial edema along the left hip musculature, anterior compartment, adductor compartment and gluteal compartment. There are areas of non enhancement within the left gluteus minimus, gluteus medius, proximal vastus lateralis, piriformis, and quadratus femoris. Largest area of non enhancement is within the mid to distal left gluteus medius. There is no well-defined/drainable intramuscular collection. There is intramuscular edema within the right hip musculature as well, only seen on coronal large field-of-view images. There is a small right hip joint effusion. Soft tissue There is extensive soft tissue swelling along the left hip and proximal thigh. There is edema along the left pelvic sidewall and ill-defined enhancement. There is also presacral edema and ill-defined  enhancement. There is no well-defined/drainable fluid collection. There is notable edema and enhancement in the soft tissues adjacent to the sciatic nerve and to a lesser degree along the femoral neurovascular bundle. IMPRESSION: Severe myofasciitis involving the left hip musculature with areas of muscle non-enhancement suggesting ischemia/developing myonecrosis, likely related to rhabdomyolysis. No well-defined/drainable abscess. Ill-defined inflammatory changes along the left pelvic sidewall, sciatic nerve, and a lesser degree the femoral neurovascular  bundle. Small, nonspecific left hip joint effusion. No acute osseous abnormality. No evidence of left SI joint septic arthritis. Partially visualized intramuscular edema within the right hip musculature to a much lesser degree than the left, also consistent with myositis. Electronically Signed   By: Caprice RenshawJacob  Kahn M.D.   On: 10/02/2021 09:37   CT HIP LEFT WO CONTRAST  Result Date: 10/01/2021 CLINICAL DATA:  Hip pain, stress fracture suspected, neg xray EXAM: CT OF THE LEFT HIP WITHOUT CONTRAST TECHNIQUE: Multidetector CT imaging of the left hip was performed according to the standard protocol. Multiplanar CT image reconstructions were also generated. RADIATION DOSE REDUCTION: This exam was performed according to the departmental dose-optimization program which includes automated exposure control, adjustment of the mA and/or kV according to patient size and/or use of iterative reconstruction technique. COMPARISON:  CT 01/15/2019. FINDINGS: Bones/Joint/Cartilage There is no evidence of acute left hip fracture. There is moderate left hip osteoarthritis with subchondral cystic change in the acetabulum. Suspected trace left hip joint effusion. No frank bony destruction. Ligaments Suboptimally assessed by CT. Muscles and Tendons Edematous appearance of the gluteal compartment musculature and quadratus femoris. There is interfascial edema within the gluteal compartment, adductor compartment, and anterior compartment of the proximal thigh. There is soft tissue stranding along the course of the left sciatic nerve. The left piriformis muscle proximally appears somewhat heterogeneous. Soft tissues There is subcutaneous edema signal along the anterior proximal left thigh. There is new left pelvic sidewall stranding. There is no well-defined fluid collection on noncontrast CT. IMPRESSION: Findings are concerning for infectious/inflammatory process involving the left hemipelvis and proximal left hip/thigh. Edematous appearance  of the left hip musculature with interfascial edema. New left pelvic sidewall edema and stranding along the course of the left sciatic nerve. Suspected small left hip joint effusion. No well-defined fluid collection on noncontrast CT. Correlate with inflammatory markers and creatine kinase. Recommend MRI of the pelvis and left thigh with and without contrast for further evaluation. Moderate left hip osteoarthritis.  No evidence of fracture. Electronically Signed   By: Caprice RenshawJacob  Kahn M.D.   On: 10/01/2021 12:14   MR LUMBAR SPINE WO CONTRAST  Result Date: 10/01/2021 CLINICAL DATA:  Low back pain with left lower extremity weakness EXAM: MRI THORACIC AND LUMBAR SPINE WITHOUT CONTRAST TECHNIQUE: Multiplanar and multiecho pulse sequences of the thoracic and lumbar spine were obtained without intravenous contrast. COMPARISON:  None Available. FINDINGS: MRI THORACIC SPINE FINDINGS Alignment:  Physiologic. Vertebrae: No fracture, evidence of discitis, or bone lesion. Cord:  Normal signal and morphology. Paraspinal and other soft tissues: Negative. Disc levels: No spinal canal stenosis. MRI LUMBAR SPINE FINDINGS Segmentation: There is transitional lumbosacral anatomy with a rudimentary disc space at L5-S1. Alignment:  Physiologic. Vertebrae:  No fracture, evidence of discitis, or bone lesion. Conus medullaris and cauda equina: Conus extends to the T12 level. Conus and cauda equina appear normal. Paraspinal and other soft tissues: Negative Disc levels: L1-L2: Normal disc space and facet joints. No spinal canal stenosis. No neural foraminal stenosis. L2-L3: Normal disc space and facet joints. No spinal canal  stenosis. No neural foraminal stenosis. L3-L4: Small central disc protrusion and mild facet widening. No spinal canal stenosis. Unchanged mild right neural foraminal stenosis. L4-L5: Normal disc space and facet joints. No spinal canal stenosis. No neural foraminal stenosis. L5-S1: Rudimentary disc space. No spinal canal  stenosis. No neural foraminal stenosis. Visualized sacrum: Normal. IMPRESSION: 1. Transitional lumbosacral anatomy with a rudimentary disc space at L5-S1. 2. Unchanged mild right L3-4 neural foraminal stenosis. 3. No cervical or thoracic spinal canal stenosis. Electronically Signed   By: Deatra Robinson M.D.   On: 10/01/2021 01:13   MR THORACIC SPINE WO CONTRAST  Result Date: 10/01/2021 CLINICAL DATA:  Low back pain with left lower extremity weakness EXAM: MRI THORACIC AND LUMBAR SPINE WITHOUT CONTRAST TECHNIQUE: Multiplanar and multiecho pulse sequences of the thoracic and lumbar spine were obtained without intravenous contrast. COMPARISON:  None Available. FINDINGS: MRI THORACIC SPINE FINDINGS Alignment:  Physiologic. Vertebrae: No fracture, evidence of discitis, or bone lesion. Cord:  Normal signal and morphology. Paraspinal and other soft tissues: Negative. Disc levels: No spinal canal stenosis. MRI LUMBAR SPINE FINDINGS Segmentation: There is transitional lumbosacral anatomy with a rudimentary disc space at L5-S1. Alignment:  Physiologic. Vertebrae:  No fracture, evidence of discitis, or bone lesion. Conus medullaris and cauda equina: Conus extends to the T12 level. Conus and cauda equina appear normal. Paraspinal and other soft tissues: Negative Disc levels: L1-L2: Normal disc space and facet joints. No spinal canal stenosis. No neural foraminal stenosis. L2-L3: Normal disc space and facet joints. No spinal canal stenosis. No neural foraminal stenosis. L3-L4: Small central disc protrusion and mild facet widening. No spinal canal stenosis. Unchanged mild right neural foraminal stenosis. L4-L5: Normal disc space and facet joints. No spinal canal stenosis. No neural foraminal stenosis. L5-S1: Rudimentary disc space. No spinal canal stenosis. No neural foraminal stenosis. Visualized sacrum: Normal. IMPRESSION: 1. Transitional lumbosacral anatomy with a rudimentary disc space at L5-S1. 2. Unchanged mild right  L3-4 neural foraminal stenosis. 3. No cervical or thoracic spinal canal stenosis. Electronically Signed   By: Deatra Robinson M.D.   On: 10/01/2021 01:13   DG Hip Unilat W or Wo Pelvis 2-3 Views Left  Result Date: 09/30/2021 CLINICAL DATA:  Left hip pain EXAM: DG HIP (WITH OR WITHOUT PELVIS) 2-3V LEFT COMPARISON:  None Available. FINDINGS: There is no evidence of hip fracture or dislocation. There is no evidence of arthropathy or other focal bone abnormality. IMPRESSION: Negative. Electronically Signed   By: Charlett Nose M.D.   On: 09/30/2021 20:04   DG Chest 1 View  Result Date: 09/30/2021 CLINICAL DATA:  Unresponsive and possible overdose. EXAM: CHEST  1 VIEW COMPARISON:  02/20/2019 FINDINGS: Top-normal heart size. There is no evidence of pulmonary edema, consolidation, pneumothorax or pleural fluid. IMPRESSION: No acute pulmonary findings.  Top-normal heart size. Electronically Signed   By: Irish Lack M.D.   On: 09/30/2021 16:29        Scheduled Meds:  FLUoxetine  60 mg Oral Daily   gabapentin  400 mg Oral TID   levothyroxine  75 mcg Oral Q0600   nicotine  21 mg Transdermal Daily   potassium chloride  20 mEq Oral BID   QUEtiapine  300 mg Oral QHS   Continuous Infusions:  sodium chloride 125 mL/hr at 10/02/21 0505     LOS: 1 day    Time spent: 35 minutes    Dorcas Carrow, MD [facility] Pager [phone]

## 2021-10-03 DIAGNOSIS — M6282 Rhabdomyolysis: Secondary | ICD-10-CM

## 2021-10-03 DIAGNOSIS — R29898 Other symptoms and signs involving the musculoskeletal system: Secondary | ICD-10-CM | POA: Diagnosis not present

## 2021-10-03 DIAGNOSIS — N179 Acute kidney failure, unspecified: Secondary | ICD-10-CM | POA: Diagnosis not present

## 2021-10-03 LAB — CK: Total CK: 42 U/L (ref 38–234)

## 2021-10-03 MED ORDER — CYCLOBENZAPRINE HCL 5 MG PO TABS: 5.0000 mg | ORAL_TABLET | Freq: Three times a day (TID) | ORAL | 1 refills | Status: AC | PRN

## 2021-10-03 NOTE — Consult Note (Signed)
Reason for Consult:Left hip myositis Referring Physician: Barb Merino Time called: 0730 Time at bedside: Frohna is an 51 y.o. female.  HPI: Mary Ewing began to have left hip and back pain towards the middle of last week. She has regular bouts of sciatica and felt this was more of the same. The pain got so bad, and she has been unable to get pain medication from medical providers, that she used heroin for relief. When she began to have weakness she presented to the ED and was admitted. 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 She is somewhat improved today. She denied fevers, chills, sweats, or N/V prior to admission.  Past Medical History:  Diagnosis Date   Acute renal failure (HCC)    Anxiety    Arm numbness 06/25/2018   Asthmatic bronchitis    history of   Depression    DJD (degenerative joint disease)    GERD (gastroesophageal reflux disease)    H/O: suicide attempt    x 3   High cholesterol    Iron deficiency anemia    Numbness and tingling in right hand 08/19/2018   Substance abuse ([place])     Past Surgical History:  Procedure Laterality Date   APPENDECTOMY  2004   CESAREAN SECTION     MOUTH SURGERY     teeth removed   REFRACTIVE SURGERY     right tympanoplasty  1999   TONSILLECTOMY      Family History  Problem Relation Age of Onset   Asthma Mother    COPD Mother    Anxiety disorder Mother    Diabetes Mother    Depression Mother    Hypertension Mother     Social History:  reports that she has been smoking cigarettes and e-cigarettes. She has a 24.00 pack-year smoking history. She has never used smokeless tobacco. She reports current alcohol use. She reports current drug use. Drugs: Cocaine and Marijuana.  Allergies:  Allergies  Allergen Reactions   Sulfonamide Derivatives Hives    Medications: I have reviewed the patient's current medications.  Results for orders placed or performed during the hospital  encounter of 09/30/21 (from the past 48 hour(s))  Culture, blood (Routine X 2) w Reflex to ID Panel     Status: None (Preliminary result)   Collection Time: 10/01/21 12:16 PM   Specimen: BLOOD RIGHT HAND  Result Value Ref Range   Specimen Description BLOOD RIGHT HAND    Special Requests      BOTTLES DRAWN AEROBIC AND ANAEROBIC Blood Culture adequate volume   Culture      NO GROWTH 2 DAYS Performed at [facility], [place] [address] [place], [place] [address]    Report Status PENDING   Culture, blood (Routine X 2) w Reflex to ID Panel     Status: None (Preliminary result)   Collection Time: 10/02/21 11:21 AM   Specimen: BLOOD LEFT HAND  Result Value Ref Range   Specimen Description BLOOD LEFT HAND    Special Requests      BOTTLES DRAWN AEROBIC ONLY Blood Culture results may not be optimal due to an inadequate volume of blood received in culture bottles   Culture      NO GROWTH < 24 HOURS Performed at [facility] [address] [place], [place] [address]    Report Status PENDING   CK     Status: None   Collection Time: 10/03/21  5:00  AM  Result Value Ref Range   Total CK 42 38 - 234 U/L    Comment: Performed at [facility], [place] [address], [place], [place] [address]    MR HIP LEFT W WO CONTRAST  Result Date: 10/02/2021 CLINICAL DATA:  Limping, acute, hip pain (Ped 0-5y) Injectable drug user, suspected septic arthritis, myofascial abscess. EXAM: MRI OF THE LEFT HIP WITHOUT AND WITH CONTRAST TECHNIQUE: Multiplanar, multisequence MR imaging was performed both before and after administration of intravenous contrast. CONTRAST:  68mL GADAVIST GADOBUTROL 1 MMOL/ML IV SOLN COMPARISON:  CT 10/01/2021. FINDINGS: Bones/Joint/Cartilage There is no significant marrow signal alteration. The cortex is intact. There is no SI joint effusion or capsular thickening. There is a small left hip joint effusion. Muscles and Tendons There is extensive intramuscular and inter fascial edema  along the left hip musculature, anterior compartment, adductor compartment and gluteal compartment. There are areas of non enhancement within the left gluteus minimus, gluteus medius, proximal vastus lateralis, piriformis, and quadratus femoris. Largest area of non enhancement is within the mid to distal left gluteus medius. There is no well-defined/drainable intramuscular collection. There is intramuscular edema within the right hip musculature as well, only seen on coronal large field-of-view images. There is a small right hip joint effusion. Soft tissue There is extensive soft tissue swelling along the left hip and proximal thigh. There is edema along the left pelvic sidewall and ill-defined enhancement. There is also presacral edema and ill-defined enhancement. There is no well-defined/drainable fluid collection. There is notable edema and enhancement in the soft tissues adjacent to the sciatic nerve and to a lesser degree along the femoral neurovascular bundle. IMPRESSION: Severe myofasciitis involving the left hip musculature with areas of muscle non-enhancement suggesting ischemia/developing myonecrosis, likely related to rhabdomyolysis. No well-defined/drainable abscess. Ill-defined inflammatory changes along the left pelvic sidewall, sciatic nerve, and a lesser degree the femoral neurovascular bundle. Small, nonspecific left hip joint effusion. No acute osseous abnormality. No evidence of left SI joint septic arthritis. Partially visualized intramuscular edema within the right hip musculature to a much lesser degree than the left, also consistent with myositis. Electronically Signed   By: Maurine Simmering M.D.   On: 10/02/2021 09:37   CT HIP LEFT WO CONTRAST  Result Date: 10/01/2021 CLINICAL DATA:  Hip pain, stress fracture suspected, neg xray EXAM: CT OF THE LEFT HIP WITHOUT CONTRAST TECHNIQUE: Multidetector CT imaging of the left hip was performed according to the standard protocol. Multiplanar CT image  reconstructions were also generated. RADIATION DOSE REDUCTION: This exam was performed according to the departmental dose-optimization program which includes automated exposure control, adjustment of the mA and/or kV according to patient size and/or use of iterative reconstruction technique. COMPARISON:  CT 01/15/2019. FINDINGS: Bones/Joint/Cartilage There is no evidence of acute left hip fracture. There is moderate left hip osteoarthritis with subchondral cystic change in the acetabulum. Suspected trace left hip joint effusion. No frank bony destruction. Ligaments Suboptimally assessed by CT. Muscles and Tendons Edematous appearance of the gluteal compartment musculature and quadratus femoris. There is interfascial edema within the gluteal compartment, adductor compartment, and anterior compartment of the proximal thigh. There is soft tissue stranding along the course of the left sciatic nerve. The left piriformis muscle proximally appears somewhat heterogeneous. Soft tissues There is subcutaneous edema signal along the anterior proximal left thigh. There is new left pelvic sidewall stranding. There is no well-defined fluid collection on noncontrast CT. IMPRESSION: Findings are concerning for infectious/inflammatory process involving the left hemipelvis and  proximal left hip/thigh. Edematous appearance of the left hip musculature with interfascial edema. New left pelvic sidewall edema and stranding along the course of the left sciatic nerve. Suspected small left hip joint effusion. No well-defined fluid collection on noncontrast CT. Correlate with inflammatory markers and creatine kinase. Recommend MRI of the pelvis and left thigh with and without contrast for further evaluation. Moderate left hip osteoarthritis.  No evidence of fracture. Electronically Signed   By: Maurine Simmering M.D.   On: 10/01/2021 12:14    Review of Systems  Constitutional:  Positive for fever. Negative for chills and diaphoresis.  HENT:   Negative for ear discharge, ear pain, hearing loss and tinnitus.   Eyes:  Negative for photophobia and pain.  Respiratory:  Negative for cough and shortness of breath.   Cardiovascular:  Negative for chest pain.  Gastrointestinal:  Positive for nausea and vomiting. Negative for abdominal pain.  Genitourinary:  Negative for dysuria, flank pain, frequency and urgency.  Musculoskeletal:  Positive for arthralgias (Left hip), back pain and gait problem. Negative for myalgias and neck pain.  Neurological:  Negative for dizziness and headaches.  Hematological:  Does not bruise/bleed easily.  Psychiatric/Behavioral:  The patient is not nervous/anxious.    Blood pressure (!) 145/85, pulse 74, temperature 99 F (37.2 C), temperature source Oral, resp. rate 18, height 5\' 2"  (1.575 m), weight 84.7 kg, SpO2 98 %. Physical Exam Constitutional:      General: She is not in acute distress.    Appearance: She is well-developed. She is not diaphoretic.  HENT:     Head: Normocephalic and atraumatic.  Eyes:     General: No scleral icterus.       Right eye: No discharge.        Left eye: No discharge.     Conjunctiva/sclera: Conjunctivae normal.  Cardiovascular:     Rate and Rhythm: Normal rate and regular rhythm.  Pulmonary:     Effort: Pulmonary effort is normal. No respiratory distress.  Musculoskeletal:     Cervical back: Normal range of motion.     Comments: LLE No traumatic wounds, ecchymosis, or rash  Minimal hip TTP, SLR 4/5, minimal pain with AROM  No knee or ankle effusion  Knee stable to varus/ valgus and anterior/posterior stress  Sens DPN, SPN, TN intact  Motor EHL 4/5, ext, flex, evers 5/5  DP 2+, PT 2+, No significant edema  Skin:    General: Skin is warm and dry.  Neurological:     Mental Status: She is alert.  Psychiatric:        Mood and Affect: Mood normal.        Behavior: Behavior normal.     Assessment/Plan: Left hip myositis -- No surgical indication at this time.  Should she worsen again or plateaus may change course. No restrictions on motion or WB.    Lisette Abu, PA-C Orthopedic Surgery [phone] 10/03/2021, 9:19 AM

## 2021-10-03 NOTE — Discharge Summary (Signed)
Physician Discharge Summary  Mary Ewing:[id number] DOB: [birth year] DOA: 09/30/2021  PCP: Macy Mis, MD  Admit date: 09/30/2021 Discharge date: 10/03/2021  Admitted From: Home Disposition: Home  Recommendations for Outpatient Follow-up:  Follow up with PCP in 1-2 weeks Please obtain BMP/CBC in one week As per pain management clinic referral from your PCP  Home Health: N/A Equipment/Devices: N/A.  She uses cane for mobility.  Discharge Condition: Stable CODE STATUS: Full code Diet recommendation: Regular diet, plenty of fluid.  Discharge summary: 51 year old with history of depression, hyperlipidemia, injectable drug use, history of discitis in 2020 presented to the ER with left lower extremity weakness, pain all over the body and unable to mobilize.  In the emergency room she had weakness of left lower extremity secondary to left hip pain , afebrile.  Mild leukocytosis.  Creatinine 2, elevated AST and LFT.  MRI of the thoracic and lumbar spine without any acute neurological deficits.  Creatinine kinase more than 20,000.  Acute nontraumatic rhabdomyolysis possibly secondary to IV drug, likely heroin.  Acute renal failure. 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   No clinical evidence of necrotizing fasciitis.  Seen by orthopedics.  Determined inflammatory but no infection.  Treated with IV fluids, renal functions normalizing.  Creatinine kinase normalized.  Symptoms improved today.  Does have chronic pain issues, cannot be prescribed narcotics because of injectable drug use.  She will continue taking  gabapentin, she will continue taking Mobic.  She will be prescribed a short course of Flexeril.  Local therapy including heat patch.  Patient was taking a statin, she will not take a statin until repeat LFTs done.   Hypothyroidism: On Synthroid.   Depression: On Seroquel and fluoxetine.  Continue.   Hypokalemia: Replaced.  Stable for discharge.      Discharge Diagnoses:  Principal Problem:   Lower extremity weakness Active Problems:   Rhabdomyolysis   Acute kidney injury (HCC)   Elevated LFTs    Discharge Instructions  Discharge Instructions     Call MD for:  severe uncontrolled pain   Complete by: As directed    Call MD for:  temperature >100.4   Complete by: As directed    Diet - low sodium heart healthy   Complete by: As directed    Increase activity slowly   Complete by: As directed       Allergies as of 10/03/2021       Reactions   Sulfonamide Derivatives Hives        Medication List     STOP taking these medications    Lipitor 20 MG tablet Generic drug: atorvastatin   nicotine 14 mg/24hr patch Commonly known as: NICODERM CQ - dosed in mg/24 hours   PSEUDOEPHEDRINE HCL PO       TAKE these medications    aspirin-acetaminophen-caffeine 250-250-65 MG tablet Commonly known as: EXCEDRIN MIGRAINE Take 2 tablets by mouth every 6 (six) hours as needed for headache.   cyclobenzaprine 5 MG tablet Commonly known as: FLEXERIL Take 1 tablet (5 mg total) by mouth 3 (three) times daily as needed for muscle spasms. What changed: when to take this  famotidine 20 MG tablet Commonly known as: PEPCID Take 20 mg by mouth every evening.   FLUoxetine 20 MG capsule Commonly known as: PROZAC Take 60 mg by mouth every morning.   gabapentin 300 MG capsule Commonly known as: NEURONTIN Take 600-1,200 mg by mouth See admin instructions. 600 mg midmorning 1200 mg at bedtime   levothyroxine 75 MCG tablet Commonly known as: SYNTHROID Take 75 mcg by mouth  every evening.   Melatonin 10 MG Tabs Take 10 mg by mouth at bedtime.   meloxicam 15 MG tablet Commonly known as: MOBIC Take 15 mg by mouth daily at 4 PM.   QUEtiapine 300 MG tablet Commonly known as: SEROQUEL Take 300 mg by mouth at bedtime.   Thera-M Tabs Take 1 tablet by mouth daily.   Vitamin D3 10 MCG (400 UNIT) Caps Take 400 Units by mouth daily.        Follow-up Information     [facility] Solution to the Opioid Problem ([facility]STOP) Follow up.   Contact information: [place] Served: [place] Fixed; mobile; peer-based  Terie Purser [phone] [email or web address]  [address], [place], [place], [address] Mobile schedule online at [email or web address]               Allergies  Allergen Reactions   Sulfonamide Derivatives Hives    Consultations: Orthopedics   Procedures/Studies: MR HIP LEFT W WO CONTRAST  Result Date: 10/02/2021 CLINICAL DATA:  Limping, acute, hip pain (Ped 0-5y) Injectable drug user, suspected septic arthritis, myofascial abscess. EXAM: MRI OF THE LEFT HIP WITHOUT AND WITH CONTRAST TECHNIQUE: Multiplanar, multisequence MR imaging was performed both before and after administration of intravenous contrast. CONTRAST:  8mL GADAVIST GADOBUTROL 1 MMOL/ML IV SOLN COMPARISON:  CT 10/01/2021. FINDINGS: Bones/Joint/Cartilage There is no significant marrow signal alteration. The cortex is intact. There is no SI joint effusion or capsular thickening. There is a small left hip joint effusion. Muscles and Tendons There is extensive intramuscular and inter fascial edema along the left hip musculature, anterior compartment, adductor compartment and gluteal compartment. There are areas of non enhancement within the left gluteus minimus, gluteus medius, proximal vastus lateralis, piriformis, and quadratus femoris. Largest area of non enhancement is within the mid to distal left gluteus medius. There is no well-defined/drainable intramuscular  collection. There is intramuscular edema within the right hip musculature as well, only seen on coronal large field-of-view images. There is a small right hip joint effusion. Soft tissue There is extensive soft tissue swelling along the left hip and proximal thigh. There is edema along the left pelvic sidewall and ill-defined enhancement. There is also presacral edema and ill-defined enhancement. There is no well-defined/drainable fluid collection. There is notable edema and enhancement in the soft tissues adjacent to the sciatic nerve and to a lesser degree along the femoral neurovascular bundle. IMPRESSION: Severe myofasciitis involving the left hip musculature with areas of muscle non-enhancement suggesting ischemia/developing myonecrosis, likely related to rhabdomyolysis. No well-defined/drainable abscess. Ill-defined inflammatory changes along the left pelvic sidewall, sciatic nerve, and a lesser degree the femoral neurovascular bundle. Small, nonspecific left hip joint effusion. No acute osseous abnormality. No evidence of left SI joint septic arthritis. Partially visualized intramuscular edema within the right hip musculature to a much lesser degree than the left, also consistent with myositis. Electronically Signed   By: Caprice Renshaw M.D.   On: 10/02/2021 09:37   CT HIP LEFT WO CONTRAST  Result Date: 10/01/2021 CLINICAL DATA:  Hip pain, stress fracture suspected, neg xray  EXAM: CT OF THE LEFT HIP WITHOUT CONTRAST TECHNIQUE: Multidetector CT imaging of the left hip was performed according to the standard protocol. Multiplanar CT image reconstructions were also generated. RADIATION DOSE REDUCTION: This exam was performed according to the departmental dose-optimization program which includes automated exposure control, adjustment of the mA and/or kV according to patient size and/or use of iterative reconstruction technique. COMPARISON:  CT 01/15/2019. FINDINGS: Bones/Joint/Cartilage There is no evidence of  acute left hip fracture. There is moderate left hip osteoarthritis with subchondral cystic change in the acetabulum. Suspected trace left hip joint effusion. No frank bony destruction. Ligaments Suboptimally assessed by CT. Muscles and Tendons Edematous appearance of the gluteal compartment musculature and quadratus femoris. There is interfascial edema within the gluteal compartment, adductor compartment, and anterior compartment of the proximal thigh. There is soft tissue stranding along the course of the left sciatic nerve. The left piriformis muscle proximally appears somewhat heterogeneous. Soft tissues There is subcutaneous edema signal along the anterior proximal left thigh. There is new left pelvic sidewall stranding. There is no well-defined fluid collection on noncontrast CT. IMPRESSION: Findings are concerning for infectious/inflammatory process involving the left hemipelvis and proximal left hip/thigh. Edematous appearance of the left hip musculature with interfascial edema. New left pelvic sidewall edema and stranding along the course of the left sciatic nerve. Suspected small left hip joint effusion. No well-defined fluid collection on noncontrast CT. Correlate with inflammatory markers and creatine kinase. Recommend MRI of the pelvis and left thigh with and without contrast for further evaluation. Moderate left hip osteoarthritis.  No evidence of fracture. Electronically Signed   By: Caprice Renshaw M.D.   On: 10/01/2021 12:14   MR LUMBAR SPINE WO CONTRAST  Result Date: 10/01/2021 CLINICAL DATA:  Low back pain with left lower extremity weakness EXAM: MRI THORACIC AND LUMBAR SPINE WITHOUT CONTRAST TECHNIQUE: Multiplanar and multiecho pulse sequences of the thoracic and lumbar spine were obtained without intravenous contrast. COMPARISON:  None Available. FINDINGS: MRI THORACIC SPINE FINDINGS Alignment:  Physiologic. Vertebrae: No fracture, evidence of discitis, or bone lesion. Cord:  Normal signal and  morphology. Paraspinal and other soft tissues: Negative. Disc levels: No spinal canal stenosis. MRI LUMBAR SPINE FINDINGS Segmentation: There is transitional lumbosacral anatomy with a rudimentary disc space at L5-S1. Alignment:  Physiologic. Vertebrae:  No fracture, evidence of discitis, or bone lesion. Conus medullaris and cauda equina: Conus extends to the T12 level. Conus and cauda equina appear normal. Paraspinal and other soft tissues: Negative Disc levels: L1-L2: Normal disc space and facet joints. No spinal canal stenosis. No neural foraminal stenosis. L2-L3: Normal disc space and facet joints. No spinal canal stenosis. No neural foraminal stenosis. L3-L4: Small central disc protrusion and mild facet widening. No spinal canal stenosis. Unchanged mild right neural foraminal stenosis. L4-L5: Normal disc space and facet joints. No spinal canal stenosis. No neural foraminal stenosis. L5-S1: Rudimentary disc space. No spinal canal stenosis. No neural foraminal stenosis. Visualized sacrum: Normal. IMPRESSION: 1. Transitional lumbosacral anatomy with a rudimentary disc space at L5-S1. 2. Unchanged mild right L3-4 neural foraminal stenosis. 3. No cervical or thoracic spinal canal stenosis. Electronically Signed   By: Deatra Robinson M.D.   On: 10/01/2021 01:13   MR THORACIC SPINE WO CONTRAST  Result Date: 10/01/2021 CLINICAL DATA:  Low back pain with left lower extremity weakness EXAM: MRI THORACIC AND LUMBAR SPINE WITHOUT CONTRAST TECHNIQUE: Multiplanar and multiecho pulse sequences of the thoracic and lumbar spine were obtained without intravenous contrast. COMPARISON:  None Available.  FINDINGS: MRI THORACIC SPINE FINDINGS Alignment:  Physiologic. Vertebrae: No fracture, evidence of discitis, or bone lesion. Cord:  Normal signal and morphology. Paraspinal and other soft tissues: Negative. Disc levels: No spinal canal stenosis. MRI LUMBAR SPINE FINDINGS Segmentation: There is transitional lumbosacral anatomy with  a rudimentary disc space at L5-S1. Alignment:  Physiologic. Vertebrae:  No fracture, evidence of discitis, or bone lesion. Conus medullaris and cauda equina: Conus extends to the T12 level. Conus and cauda equina appear normal. Paraspinal and other soft tissues: Negative Disc levels: L1-L2: Normal disc space and facet joints. No spinal canal stenosis. No neural foraminal stenosis. L2-L3: Normal disc space and facet joints. No spinal canal stenosis. No neural foraminal stenosis. L3-L4: Small central disc protrusion and mild facet widening. No spinal canal stenosis. Unchanged mild right neural foraminal stenosis. L4-L5: Normal disc space and facet joints. No spinal canal stenosis. No neural foraminal stenosis. L5-S1: Rudimentary disc space. No spinal canal stenosis. No neural foraminal stenosis. Visualized sacrum: Normal. IMPRESSION: 1. Transitional lumbosacral anatomy with a rudimentary disc space at L5-S1. 2. Unchanged mild right L3-4 neural foraminal stenosis. 3. No cervical or thoracic spinal canal stenosis. Electronically Signed   By: Deatra Robinson M.D.   On: 10/01/2021 01:13   DG Hip Unilat W or Wo Pelvis 2-3 Views Left  Result Date: 09/30/2021 CLINICAL DATA:  Left hip pain EXAM: DG HIP (WITH OR WITHOUT PELVIS) 2-3V LEFT COMPARISON:  None Available. FINDINGS: There is no evidence of hip fracture or dislocation. There is no evidence of arthropathy or other focal bone abnormality. IMPRESSION: Negative. Electronically Signed   By: Charlett Nose M.D.   On: 09/30/2021 20:04   DG Chest 1 View  Result Date: 09/30/2021 CLINICAL DATA:  Unresponsive and possible overdose. EXAM: CHEST  1 VIEW COMPARISON:  02/20/2019 FINDINGS: Top-normal heart size. There is no evidence of pulmonary edema, consolidation, pneumothorax or pleural fluid. IMPRESSION: No acute pulmonary findings.  Top-normal heart size. Electronically Signed   By: Irish Lack M.D.   On: 09/30/2021 16:29   (Echo, Carotid, EGD, Colonoscopy, ERCP)     Subjective: Patient seen and examined.  Mild to moderate pain left hip persist, however much better than before.  Remains afebrile throughout.  Eager to go home.  Urine is clear. Lab could not draw her renal function test or LFTs, however CK level was drawn and was normal.  Cultures are negative.   Discharge Exam: Vitals:   10/02/21 2044 10/03/21 0621  BP: 118/76 (!) 145/85  Pulse: 68 74  Resp: 18 18  Temp: 99.4 F (37.4 C) 99 F (37.2 C)  SpO2: 98% 98%   Vitals:   10/02/21 0846 10/02/21 1652 10/02/21 2044 10/03/21 0621  BP: 120/72 136/79 118/76 (!) 145/85  Pulse: 73 75 68 74  Resp: Temp: 99.2 F (37.3 C) 99 F (37.2 C) 99.4 F (37.4 C) 99 F (37.2 C)  TempSrc: Oral Oral Oral Oral  SpO2: 99% 100% 98% 98%  Weight:      Height:        General: Pt is alert, awake, not in acute distress Cardiovascular: RRR, S1/S2 +, no rubs, no gallops Respiratory: CTA bilaterally, no wheezing, no rhonchi Abdominal: Soft, NT, ND, bowel sounds + Extremities: no edema, no cyanosis    The results of significant diagnostics from this hospitalization (including imaging, microbiology, ancillary and laboratory) are listed below for reference.     Microbiology: Recent Results (from the past 240 hour(s))  Culture, blood (  Routine X 2) w Reflex to ID Panel     Status: None (Preliminary result)   Collection Time: 10/01/21 12:16 PM   Specimen: BLOOD RIGHT HAND  Result Value Ref Range Status   Specimen Description BLOOD RIGHT HAND  Final   Special Requests   Final    BOTTLES DRAWN AEROBIC AND ANAEROBIC Blood Culture adequate volume   Culture   Final    NO GROWTH 1 DAY Performed at [facility], [address], [place], [place] [address]    Report Status PENDING  Incomplete  Culture, blood (Routine X 2) w Reflex to ID Panel     Status: None (Preliminary result)   Collection Time: 10/02/21 11:21 AM   Specimen: BLOOD LEFT HAND  Result Value Ref Range Status   Specimen  Description BLOOD LEFT HAND  Final   Special Requests   Final    BOTTLES DRAWN AEROBIC ONLY Blood Culture results may not be optimal due to an inadequate volume of blood received in culture bottles   Culture   Final    NO GROWTH < 12 HOURS Performed at [facility], [address], [place], [place] [address]    Report Status PENDING  Incomplete     Labs: BNP (last 3 results) No results for input(s): "BNP" in the last 8760 hours. Basic Metabolic Panel: Recent Labs  Lab 09/30/21 1800 10/01/21 0636  NA 137 140  K 4.0 3.3*  CL 99 110  CO2 25 21*  GLUCOSE 100* 118*  BUN 22* 16  CREATININE 2.07* 1.39*  CALCIUM 9.3 8.0*  MG  --  1.9  PHOS  --  2.6   Liver Function Tests: Recent Labs  Lab 09/30/21 1800 09/30/21 2240 10/01/21 0636  AST 475* 414* 340*  ALT 117* 102* 95*  ALKPHOS 110 82 86  BILITOT 0.7 1.3* 0.4  PROT 9.0* 7.0 7.0  ALBUMIN 4.0 3.0* 3.0*   No results for input(s): "LIPASE", "AMYLASE" in the last 168 hours. No results for input(s): "AMMONIA" in the last 168 hours. CBC: Recent Labs  Lab 09/30/21 1800 10/01/21 0636  WBC 11.4* 7.9  NEUTROABS 8.1* 5.4  HGB 11.9* 10.1*  HCT 38.5 31.0*  MCV 87.3 84.7  PLT 292 191   Cardiac Enzymes: Recent Labs  Lab 09/30/21 2240 10/01/21 0636 10/03/21 0500  CKTOTAL 21,958* 17,021* 42   BNP: Invalid input(s): "POCBNP" CBG: Recent Labs  Lab 09/30/21 1444  GLUCAP 108*   D-Dimer No results for input(s): "DDIMER" in the last 72 hours. Hgb A1c No results for input(s): "HGBA1C" in the last 72 hours. Lipid Profile No results for input(s): "CHOL", "HDL", "LDLCALC", "TRIG", "CHOLHDL", "LDLDIRECT" in the last 72 hours. Thyroid function studies No results for input(s): "TSH", "T4TOTAL", "T3FREE", "THYROIDAB" in the last 72 hours.  Invalid input(s): "FREET3" Anemia work up No results for input(s): "VITAMINB12", "FOLATE", "FERRITIN", "TIBC", "IRON", "RETICCTPCT" in the last 72 hours. Urinalysis    Component  Value Date/Time   COLORURINE AMBER (A) 09/30/2021 1805   APPEARANCEUR CLOUDY (A) 09/30/2021 1805   LABSPEC 1.025 09/30/2021 1805   PHURINE 5.0 09/30/2021 1805   GLUCOSEU NEGATIVE 09/30/2021 1805   HGBUR LARGE (A) 09/30/2021 1805   BILIRUBINUR NEGATIVE 09/30/2021 1805   KETONESUR NEGATIVE 09/30/2021 1805   PROTEINUR 100 (A) 09/30/2021 1805   UROBILINOGEN 0.2 04/09/2014 1314   NITRITE NEGATIVE 09/30/2021 1805   LEUKOCYTESUR NEGATIVE 09/30/2021 1805   Sepsis Labs Recent Labs  Lab 09/30/21 1800 10/01/21 0636  WBC  11.4* 7.9   Microbiology Recent Results (from the past 240 hour(s))  Culture, blood (Routine X 2) w Reflex to ID Panel     Status: None (Preliminary result)   Collection Time: 10/01/21 12:16 PM   Specimen: BLOOD RIGHT HAND  Result Value Ref Range Status   Specimen Description BLOOD RIGHT HAND  Final   Special Requests   Final    BOTTLES DRAWN AEROBIC AND ANAEROBIC Blood Culture adequate volume   Culture   Final    NO GROWTH 1 DAY Performed at [facility], [address] [place], [place] [address]    Report Status PENDING  Incomplete  Culture, blood (Routine X 2) w Reflex to ID Panel     Status: None (Preliminary result)   Collection Time: 10/02/21 11:21 AM   Specimen: BLOOD LEFT HAND  Result Value Ref Range Status   Specimen Description BLOOD LEFT HAND  Final   Special Requests   Final    BOTTLES DRAWN AEROBIC ONLY Blood Culture results may not be optimal due to an inadequate volume of blood received in culture bottles   Culture   Final    NO GROWTH < 12 HOURS Performed at [facility], [address] [place], [place] [address]    Report Status PENDING  Incomplete     Time coordinating discharge: 40 minutes  SIGNED:   Dorcas CarrowKuber Elleah Hemsley, MD  [facility] 10/03/2021, 8:04 AM

## 2021-10-03 NOTE — TOC Transition Note (Signed)
Transition of Care [facility]) - CM/SW Discharge Note   Patient Details  Name: Mary Ewing MRN: [id number] Date of Birth: 08/03/1970  Transition of Care [facility]) CM/SW Contact:  Tom-Johnson, Hershal Coria, RN Phone Number: 10/03/2021, 10:30 AM   Clinical Narrative:     Patient is scheduled for discharge today. Hosp f/u appointment scheduled with [facility] and info on AVS. No PT/OT needs or recommendations noted. Patient denies any other needs. Family to transport at discharge. No further TOC needs noted.   Final next level of care: Home/Self Care Barriers to Discharge: Barriers Resolved   Patient Goals and CMS Choice Patient states their goals for this hospitalization and ongoing recovery are:: To return home CMS Medicare.gov Compare Post Acute Care list provided to:: Patient Choice offered to / list presented to : Patient  Discharge Placement                Patient to be transferred to facility by: Family      Discharge Plan and Services                DME Arranged: N/A DME Agency: NA       HH Arranged: NA HH Agency: NA        Social Determinants of Health (SDOH) Interventions     Readmission Risk Interventions     No data to display

## 2021-10-06 LAB — CULTURE, BLOOD (ROUTINE X 2)
Culture: NO GROWTH
Special Requests: ADEQUATE

## 2021-10-07 LAB — CULTURE, BLOOD (ROUTINE X 2): Culture: NO GROWTH

## 2021-10-11 ENCOUNTER — Ambulatory Visit (INDEPENDENT_AMBULATORY_CARE_PROVIDER_SITE_OTHER): Payer: Medicare Other | Admitting: Primary Care

## 2021-10-11 ENCOUNTER — Encounter (INDEPENDENT_AMBULATORY_CARE_PROVIDER_SITE_OTHER): Payer: Self-pay | Admitting: Primary Care

## 2021-10-11 VITALS — BP 148/95 | HR 88 | Temp 98.0°F | Ht 62.0 in | Wt 173.8 lb

## 2021-10-11 DIAGNOSIS — F1910 Other psychoactive substance abuse, uncomplicated: Secondary | ICD-10-CM | POA: Diagnosis not present

## 2021-10-11 DIAGNOSIS — Z09 Encounter for follow-up examination after completed treatment for conditions other than malignant neoplasm: Secondary | ICD-10-CM | POA: Diagnosis not present

## 2021-10-11 DIAGNOSIS — Z7689 Persons encountering health services in other specified circumstances: Secondary | ICD-10-CM | POA: Diagnosis not present

## 2021-10-11 DIAGNOSIS — R5381 Other malaise: Secondary | ICD-10-CM | POA: Diagnosis not present

## 2021-10-11 NOTE — Progress Notes (Signed)
New Patient Office Visit  Subjective    Patient ID: TWILIA YAKLIN, female    DOB: [birth year]  Age: 51 y.o. MRN: [id number]  CC:  Chief Complaint  Patient presents with   Hospitalization Follow-up    Left lower extremity weakness Per patient a few days after being discharged she developed drop foot     HPI Ms. LAYLANI PUDWILL presents to establish care and hospital discharge .She has her sister Marcelino Duster present and gave permission to be at this visit. Patient main concern is/was why after hospital discharge she had Left lower extremity weakness  and Per patient a few days after being discharged she developed drop foot.She has a history of depression, hyperlipidemia  presents to the ER because of increasing weakness of the left lower extremity since last evening.  Denies any incontinence of urine or bowel.  Admits to taking heroin.  No fever or chills. Explain in ED 09/30/2021 - 10/03/2021 on exam patient does have weakness of the left lower extremity unable to lift it against gravity. She states she has been buying medication for her pain off of people if other things are in her UDS she was not aware. She is now under investigation by [facility] for her son that she explains has different mental issue but denies using drugs during pregnancy. All she wants is PAIN medication . Explain no one in [place] with license to  prescribe would give her pain medication with her hx. Suggested referring or committing to rehab - NO I have a cat. Marcelino Duster sister would watch the cat. Called Care Management Coordinator to also, reiterate the purpose of rehab and shows the courts she is serious- no change. Outpatient Encounter Medications as of 10/11/2021  Medication Sig   aspirin-acetaminophen-caffeine (EXCEDRIN MIGRAINE) 250-250-65 MG tablet Take 2 tablets by mouth every 6 (six) hours as needed for headache.   Cholecalciferol (VITAMIN D3) 10 MCG (400 UNIT) CAPS Take 400 Units by mouth daily.   cyclobenzaprine (FLEXERIL) 5 MG  tablet Take 1 tablet (5 mg total) by mouth 3 (three) times daily as needed for muscle spasms.   famotidine (PEPCID) 20 MG tablet Take 20 mg by mouth every evening.   FLUoxetine (PROZAC) 20 MG capsule Take 60 mg by mouth every morning.    gabapentin (NEURONTIN) 300 MG capsule Take 600-1,200 mg by mouth See admin instructions. 600 mg midmorning 1200 mg at bedtime   levothyroxine (SYNTHROID) 75 MCG tablet Take 75 mcg by mouth every evening.   Melatonin 10 MG TABS Take 10 mg by mouth at bedtime.   meloxicam (MOBIC) 15 MG tablet Take 15 mg by mouth daily at 4 PM.   Multiple Vitamins-Minerals (THERA-M) TABS Take 1 tablet by mouth daily.    QUEtiapine (SEROQUEL) 300 MG tablet Take 300 mg by mouth at bedtime.    No facility-administered encounter medications on file as of 10/11/2021.    Past Medical History:  Diagnosis Date   Acute renal failure (HCC)    Anxiety    Arm numbness 06/25/2018   Asthmatic bronchitis    history of   Depression    DJD (degenerative joint disease)    GERD (gastroesophageal reflux disease)    H/O: suicide attempt    x 3   High cholesterol    Iron deficiency anemia    Numbness and tingling in right hand 08/19/2018   Substance abuse [facility])     Past Surgical History:  Procedure Laterality Date   APPENDECTOMY  2004  CESAREAN SECTION     MOUTH SURGERY     teeth removed   REFRACTIVE SURGERY     right tympanoplasty  1999   TONSILLECTOMY      Family History  Problem Relation Age of Onset   Asthma Mother    COPD Mother    Anxiety disorder Mother    Diabetes Mother    Depression Mother    Hypertension Mother     Social History   Socioeconomic History   Marital status: Single    Spouse name: Not on file   Number of children: Not on file   Years of education: Not on file   Highest education level: Not on file  Occupational History   Occupation: disability  Tobacco Use   Smoking status: Every Day    Packs/day: 1.00    Years: 24.00    Total pack  years: 24.00    Types: Cigarettes, E-cigarettes   Smokeless tobacco: Never   Tobacco comments:    needs a patch  Vaping Use   Vaping Use: Every day  Substance and Sexual Activity   Alcohol use: Yes    Alcohol/week: 0.0 standard drinks of alcohol    Comment: occ   Drug use: Yes    Types: Cocaine, Marijuana    Comment: prescription drugs;last use of marijuana was this AM, denies cocaine    Sexual activity: Never    Birth control/protection: None  Other Topics Concern   Not on file  Social History Narrative   Not on file   Social Determinants of Health   Financial Resource Strain: Not on file  Food Insecurity: Not on file  Transportation Needs: Not on file  Physical Activity: Not on file  Stress: Not on file  Social Connections: Not on file  Intimate Partner Violence: Not on file    ROS Comprehensive ROS Pertinent positive and negative noted in HPI   Objective    BP (!) 148/95   Pulse 88   Temp 98 F (36.7 C) (Oral)   Ht 5\' 2"  (1.575 m)   Wt 173 lb 12.8 oz (78.8 kg)   SpO2 96%   BMI 31.79 kg/m   Physical Exam Constitutional:      Appearance: Normal appearance.  HENT:     Right Ear: Tympanic membrane and external ear normal.     Left Ear: Tympanic membrane and external ear normal.     Nose: Nose normal.  Eyes:     Extraocular Movements: Extraocular movements intact.     Pupils: Pupils are equal, round, and reactive to light.  Cardiovascular:     Rate and Rhythm: Normal rate and regular rhythm.  Pulmonary:     Effort: Pulmonary effort is normal.     Breath sounds: Normal breath sounds.  Abdominal:     General: Bowel sounds are normal. There is distension.     Palpations: Abdomen is soft.  Musculoskeletal:        General: Normal range of motion.     Cervical back: Normal range of motion and neck supple.  Skin:    General: Skin is warm and dry.  Neurological:     Mental Status: She is alert and oriented to person, place, and time.  Psychiatric:         Mood and Affect: Mood normal.        Behavior: Behavior normal.        Thought Content: Thought content normal.      Assessment & Plan:  Cela was seen today for hospitalization follow-up.  Diagnoses and all orders for this visit:  Physical deconditioning -     Ambulatory referral to Physical Therapy -     Ambulatory referral to Orthopedic Surgery  Encounter to establish care Establish care  Hospital discharge follow-up Recommendations for Outpatient Follow-up:  Follow up with PCP in 1-2 weeks Please obtain BMP/CBC in one week- refused  As per pain management clinic referral from your PCP- referral made to physical therapy or offered intense drug rehab    Substance abuse [facility]) Information again provided - refuses tx [facility] Solution to the Opioid Problem ([facility]) Follow up.   Contact information: [place] [employer]: [place] [employer]; mobile; peer-based  Terie Purser [phone] [email or web address]  [address], [place], [place], [address] Mobile schedule online at https   Grayce Sessions, NP

## 2021-10-11 NOTE — Progress Notes (Signed)
Pt would like referral to pain management. 

## 2021-11-16 ENCOUNTER — Ambulatory Visit: Payer: Medicaid Other | Admitting: Orthopaedic Surgery

## 2022-03-24 DEATH — deceased

## 2022-08-07 ENCOUNTER — Encounter: Payer: Self-pay | Admitting: *Deleted
